# Patient Record
Sex: Male | Born: 1960 | Race: White | Hispanic: No | Marital: Married | Smoking: Former smoker
Health system: Southern US, Community
[De-identification: ages and names within clinical notes are randomized; demographics above are authoritative.]

## PROBLEM LIST (undated history)

## (undated) DIAGNOSIS — E119 Type 2 diabetes mellitus without complications: Secondary | ICD-10-CM

## (undated) DIAGNOSIS — Z720 Tobacco use: Secondary | ICD-10-CM

## (undated) DIAGNOSIS — E785 Hyperlipidemia, unspecified: Secondary | ICD-10-CM

## (undated) DIAGNOSIS — C801 Malignant (primary) neoplasm, unspecified: Secondary | ICD-10-CM

## (undated) DIAGNOSIS — I10 Essential (primary) hypertension: Secondary | ICD-10-CM

## (undated) DIAGNOSIS — M1990 Unspecified osteoarthritis, unspecified site: Secondary | ICD-10-CM

## (undated) DIAGNOSIS — K219 Gastro-esophageal reflux disease without esophagitis: Secondary | ICD-10-CM

## (undated) DIAGNOSIS — T8859XA Other complications of anesthesia, initial encounter: Secondary | ICD-10-CM

## (undated) DIAGNOSIS — Z9289 Personal history of other medical treatment: Secondary | ICD-10-CM

## (undated) DIAGNOSIS — L989 Disorder of the skin and subcutaneous tissue, unspecified: Secondary | ICD-10-CM

## (undated) DIAGNOSIS — F410 Panic disorder [episodic paroxysmal anxiety] without agoraphobia: Secondary | ICD-10-CM

## (undated) DIAGNOSIS — I219 Acute myocardial infarction, unspecified: Secondary | ICD-10-CM

## (undated) DIAGNOSIS — I2510 Atherosclerotic heart disease of native coronary artery without angina pectoris: Secondary | ICD-10-CM

## (undated) DIAGNOSIS — T4145XA Adverse effect of unspecified anesthetic, initial encounter: Secondary | ICD-10-CM

## (undated) DIAGNOSIS — R0600 Dyspnea, unspecified: Secondary | ICD-10-CM

## (undated) DIAGNOSIS — N433 Hydrocele, unspecified: Secondary | ICD-10-CM

## (undated) HISTORY — DX: Atherosclerotic heart disease of native coronary artery without angina pectoris: I25.10

## (undated) HISTORY — DX: Hyperlipidemia, unspecified: E78.5

## (undated) HISTORY — PX: OTHER SURGICAL HISTORY: SHX169

## (undated) HISTORY — PX: FRACTURE SURGERY: SHX138

## (undated) HISTORY — PX: CARPAL TUNNEL RELEASE: SHX101

## (undated) HISTORY — DX: Type 2 diabetes mellitus without complications: E11.9

## (undated) HISTORY — PX: KNEE ARTHROSCOPY: SUR90

## (undated) HISTORY — DX: Hydrocele, unspecified: N43.3

## (undated) HISTORY — PX: CARDIAC CATHETERIZATION: SHX172

## (undated) NOTE — *Deleted (*Deleted)
PACU TO INPATIENT HANDOFF REPORT  Name/Age/Gender Todd [name] 36 y.o. male  Code Status Code Status History    Date Active Date Inactive Code Status Order ID Comments User Context   05/06/2018 2320 05/07/2018 2016 Full Code [id number]  Therisa Doyne, MD Inpatient   05/01/2018 1635 05/03/2018 1425 Full Code [id number]  Ranee Gosselin, MD Inpatient   09/20/2016 1141 09/21/2016 0315 Full Code [id number]  Sebastian Ache, MD HOV   01/11/2012 2112 01/12/2012 2025 Full Code [id number]  Raelyn Number, RN Inpatient   Advance Care Planning Activity    Questions for Most Recent Historical Code Status (Order [id number])       Home/SNF/Other {Discharge Destination:18313::"Home"}  Chief Complaint endobronchial lesion  Level of Care/Admitting Diagnosis ED Disposition    None      Medical History Past Medical History:  Diagnosis Date  . Arthritis   . CAD (coronary artery disease)    NSTEMI with DES to LAD; 100% nondominant RCA with collaterals - EF is normal.   . 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 Complication of anesthesia    slow to wake up   . Diabetes mellitus type II, non insulin dependent (HCC)   . Dyspnea    with panic attacks  . GERD (gastroesophageal reflux disease)   . History of blood transfusion   . HLD (hyperlipidemia)   . Hydrocele in adult    confirmed by [place], elected to monitor after seeing urology.   . Hyperlipidemia   . Hypertension   . Myocardial infarction (HCC)   . Panic attacks   . Pneumonia 2021  . Skin abnormalities    affecting right hand thumb, pointer, and index fingers and palm; left hand thumb pointer and index finger. peeling/cracked/blister skin , warm to touch. scattered areas of superficial skin loss esp to R/L anterior index digits. glossy appearance and edema, reports itchiness, numbness, and pain, no drainage, occurs intermittently, ongoing for years, unsure of triggers, temp.relief w/steroids   . Tobacco abuse      Allergies Allergies  Allergen Reactions  . Dust Mite Extract Anaphylaxis  . Other Swelling, Rash and Other (See Comments)    Grass  Dog Dander-sneeze and throat swells-- long haired dog  . Penicillins Hives  . Aspirin Nausea Only and Other (See Comments)    Patient can tolerate 81 mg ONLY  . Latex Other (See Comments)    Unknown  . Ibuprofen Nausea And Vomiting and Other (See Comments)    Causes Severe headaches    IV Location/Drains/Wounds Patient Lines/Drains/Airways Status    Active Line/Drains/Airways    Name Placement date Placement time Site Days   Incision (Closed) 05/01/18 Knee Right 05/01/18  1401   848          Labs/Imaging Results for orders placed or performed during the hospital encounter of 08/26/20 (from the past 48 hour(s))  CBC     Status: None   Collection Time: 08/26/20  6:12 AM  Result Value Ref Range   WBC 7.5 4.0 - 10.5 K/uL   RBC 4.89 4.22 - 5.81 MIL/uL   Hemoglobin 15.0 13.0 - 17.0 g/dL   HCT 53.6 39 - 52 %   MCV 94.5 80.0 - 100.0 fL   MCH 30.7 26.0 - 34.0 pg   MCHC 32.5 30.0 - 36.0 g/dL   RDW 64.4 03.4 - 74.2 %   Platelets 172 150 - 400 K/uL   nRBC 0.0 0.0 - 0.2 %  Comment: Performed at [facility], [address] [place], [place] [address]  Protime-INR     Status: None   Collection Time: 08/26/20  6:12 AM  Result Value Ref Range   Prothrombin Time 13.0 11.4 - 15.2 seconds   INR 1.0 0.8 - 1.2    Comment: (NOTE) INR goal varies based on device and disease states. Performed at [facility], [address], [place], [place] [address]   Glucose, capillary     Status: Abnormal   Collection Time: 08/26/20  6:19 AM  Result Value Ref Range   Glucose-Capillary 180 (H) 70 - 99 mg/dL    Comment: Glucose reference range applies only to samples taken after fasting for at least 8 hours.  Basic metabolic panel     Status: Abnormal   Collection Time: 08/26/20  6:39 AM  Result Value Ref Range   Sodium 136 135 - 145 mmol/L    Potassium 4.0 3.5 - 5.1 mmol/L   Chloride 97 (L) 98 - 111 mmol/L   CO2 29 22 - 32 mmol/L   Glucose, Bld 164 (H) 70 - 99 mg/dL    Comment: Glucose reference range applies only to samples taken after fasting for at least 8 hours.   BUN 5 (L) 8 - 23 mg/dL   Creatinine, Ser 7.82 0.61 - 1.24 mg/dL   Calcium 8.8 (L) 8.9 - 10.3 mg/dL   GFR, Estimated >95 >62 mL/min   Anion gap 10 5 - 15    Comment: Performed at [facility], [address], [place], [place] [address]  Glucose, capillary     Status: Abnormal   Collection Time: 08/26/20  9:22 AM  Result Value Ref Range   Glucose-Capillary 205 (H) 70 - 99 mg/dL    Comment: Glucose reference range applies only to samples taken after fasting for at least 8 hours.   DG CHEST PORT 1 VIEW  Result Date: 08/26/2020 CLINICAL DATA:  Respiratory abnormality EXAM: PORTABLE CHEST 1 VIEW COMPARISON:  07/23/2020 FINDINGS: None lung volumes are small, but are symmetric. Pulmonary insufflation has decreased since prior examination and there has developed bibasilar atelectasis. No superimposed confluent pulmonary infiltrate. No pneumothorax or pleural effusion. Cardiac size within normal limits. No acute bone abnormality. IMPRESSION: Progressive pulmonary hypoinflation with bibasilar atelectasis. Electronically Signed   By: Helyn Numbers MD   On: 08/26/2020 09:46    Pending Labs   Vitals/Pain Today's Vitals   08/26/20 1045 08/26/20 1100 08/26/20 1115 08/26/20 1130  BP: 120/71 122/71 112/79 115/71  Pulse: 79 70 67 70  Resp: 13 18 14 18   Temp:    (!) 97.5 F (36.4 C)  TempSrc:      SpO2: 90% 96% 100% 95%  Weight:      Height:      PainSc:  0-No pain 0-No pain 0-No pain    Isolation Precautions @ISOLATION @  Administered Medications Periop Administered Meds from 08/26/2020 0618 to 08/26/2020 1155      Date/Time Order Dose Route Action Action by Comments    08/26/2020 0630 chlorhexidine (PERIDEX) 0.12 % solution 15 mL 15 mL Mouth/Throat Given  Wendy Poet, RN     08/26/2020 0954 insulin aspart (novoLOG) injection 5 Units 5 Units Subcutaneous Given Alinda Deem, RN     08/26/2020 5784 lactated ringers infusion   Intravenous Anesthesia Volume Adjustment Samara Deist, CRNA     08/26/2020 0727 lactated ringers infusion   Intravenous New Bag/Given Samara Deist, CRNA  08/26/2020 0736 lidocaine 2% (20 mg/mL) 5 mL syringe 40 mg Intravenous Given Samara Deist, CRNA     08/26/2020 0817 ondansetron (ZOFRAN) injection 4 mg Intravenous Given Samara Deist, CRNA     08/26/2020 0825 phenylephrine (NEOSYNEPHRINE) 10-0.9 MG/250ML-% infusion 0 mcg/min Intravenous Stopped Samara Deist, CRNA     08/26/2020 614 034 4947 phenylephrine (NEOSYNEPHRINE) 10-0.9 MG/250ML-% infusion 25 mcg/min Intravenous Rate/Dose Change Samara Deist, CRNA     08/26/2020 0802 phenylephrine (NEOSYNEPHRINE) 10-0.9 MG/250ML-% infusion 100 mcg/min Intravenous Rate/Dose Change Samara Deist, CRNA     08/26/2020 0751 phenylephrine (NEOSYNEPHRINE) 10-0.9 MG/250ML-% infusion 75 mcg/min Intravenous Rate/Dose Change Samara Deist, CRNA     08/26/2020 0743 phenylephrine (NEOSYNEPHRINE) 10-0.9 MG/250ML-% infusion 50 mcg/min Intravenous New Bag/Given Samara Deist, CRNA     08/26/2020 0746 PHENYLephrine 40 mcg/ml in normal saline Adult IV Push Syringe (For Blood Pressure Support) 40 mcg Intravenous Given Samara Deist, CRNA     08/26/2020 0744 PHENYLephrine 40 mcg/ml in normal saline Adult IV Push Syringe (For Blood Pressure Support) 80 mcg Intravenous Given Samara Deist, CRNA     08/26/2020 0741 PHENYLephrine 40 mcg/ml in normal saline Adult IV Push Syringe (For Blood Pressure Support) 80 mcg Intravenous Given Samara Deist, CRNA     08/26/2020 0736 propofol (DIPRIVAN) 10 mg/mL bolus/IV push 170 mg Intravenous Given Samara Deist, CRNA     08/26/2020 0736 rocuronium bromide 10 mg/mL (PF) syringe 60 mg Intravenous Given Samara Deist, CRNA     08/26/2020 0825 sugammadex sodium (BRIDION) injection 200 mg Intravenous Given Samara Deist, CRNA     08/26/2020 [phone] sugammadex sodium (BRIDION) injection 200 mg Intravenous Given Samara Deist, CRNA       Mobility {Mobility:20148}

---

## 1984-11-06 DIAGNOSIS — C801 Malignant (primary) neoplasm, unspecified: Secondary | ICD-10-CM

## 1984-11-06 HISTORY — DX: Malignant (primary) neoplasm, unspecified: C80.1

## 1998-02-13 ENCOUNTER — Emergency Department (HOSPITAL_COMMUNITY): Admission: EM | Admit: 1998-02-13 | Discharge: 1998-02-13 | Payer: Self-pay | Admitting: Emergency Medicine

## 2000-02-22 ENCOUNTER — Emergency Department (HOSPITAL_COMMUNITY): Admission: EM | Admit: 2000-02-22 | Discharge: 2000-02-22 | Payer: Self-pay | Admitting: Emergency Medicine

## 2001-06-28 ENCOUNTER — Emergency Department (HOSPITAL_COMMUNITY): Admission: EM | Admit: 2001-06-28 | Discharge: 2001-06-28 | Payer: Self-pay | Admitting: Emergency Medicine

## 2003-03-10 ENCOUNTER — Emergency Department (HOSPITAL_COMMUNITY): Admission: EM | Admit: 2003-03-10 | Discharge: 2003-03-10 | Payer: Self-pay | Admitting: Emergency Medicine

## 2004-01-27 ENCOUNTER — Ambulatory Visit (HOSPITAL_BASED_OUTPATIENT_CLINIC_OR_DEPARTMENT_OTHER): Admission: RE | Admit: 2004-01-27 | Discharge: 2004-01-27 | Payer: Self-pay | Admitting: Urology

## 2004-03-01 ENCOUNTER — Emergency Department (HOSPITAL_COMMUNITY): Admission: EM | Admit: 2004-03-01 | Discharge: 2004-03-01 | Payer: Self-pay | Admitting: Emergency Medicine

## 2004-03-02 ENCOUNTER — Emergency Department (HOSPITAL_COMMUNITY): Admission: EM | Admit: 2004-03-02 | Discharge: 2004-03-02 | Payer: Self-pay | Admitting: Emergency Medicine

## 2010-03-14 ENCOUNTER — Emergency Department (HOSPITAL_COMMUNITY): Admission: EM | Admit: 2010-03-14 | Discharge: 2010-03-14 | Payer: Self-pay | Admitting: Emergency Medicine

## 2010-05-18 ENCOUNTER — Emergency Department (HOSPITAL_COMMUNITY): Admission: EM | Admit: 2010-05-18 | Discharge: 2010-05-18 | Payer: Self-pay | Admitting: Family Medicine

## 2010-09-10 ENCOUNTER — Emergency Department (HOSPITAL_COMMUNITY): Admission: EM | Admit: 2010-09-10 | Discharge: 2010-09-10 | Payer: Self-pay | Admitting: Family Medicine

## 2011-03-24 NOTE — Op Note (Signed)
NAME:  Todd [name], Todd [name]                           ACCOUNT NO.:  [id number]   MEDICAL RECORD NO.:  [id number]                   PATIENT TYPE:  AMB   LOCATION:  [facility]                                 FACILITY:  [facility]   PHYSICIAN:  Valetta Fuller, M.D.               DATE OF BIRTH:  1961-01-04   DATE OF PROCEDURE:  01/27/2004  DATE OF DISCHARGE:  01/27/2004                                 OPERATIVE REPORT   PREOPERATIVE DIAGNOSIS:  Elective bilateral vasectomy.   POSTOPERATIVE DIAGNOSIS:  Elective bilateral vasectomy.   OPERATION/PROCEDURE:  Bilateral scrotal vasectomy.   SURGEON:  Valetta Fuller, M.D.   ANESTHESIA:  General.   INDICATIONS:  [name] is a 50 year old male.  He presented to our office for  consideration of vasectomy.  He understood the advantages and disadvantages  of that approach.  He agreed to have this in the office which is the way we  normally approach this.  At the time of his procedure, he was clearly  markedly anxious.  On exam, he had a very, very tight scrotum.  We attempted  to warm the scrotum to loosen it.  Despite that maneuver, any manipulation  of the scrotum was very difficult for the patient to handle.  I think this  was due partly to anxiety.  For that reason, we felt that __________ in the  office may be very difficult and we elected to cancel it and reschedule it  for today under anesthesia.   DESCRIPTION OF PROCEDURE:  The patient was brought to the operating room  where he had successful induction of general anesthesia.  He was then  prepped and draped in the usual manner.   Attention was first turned to the right hemiscrotum.  The vas deferens was  separated from the other surrounding spermatic cord structures and  manipulated towards the anterior surface of the scrotal wall  A small  incision was made overlying the vas.  The vas grasper was then used to pull  up the vas.  Using a combination of sharp and blunt dissection technique,  approximately a 3 cm section of vas deferens was cleared.  Proximal and  distal ends were clamped and the segment was removed.  Both ends were then  cauterized with the needle tip cautery.  Both ends were tied with Vicryl  suture.  The more proximal aspect of the vas was then buried in a separate  adventitial plane away from the other side.  Skin was closed with some  interrupted absorbable suture.  The contralateral side was done in an  analogous manner.  The patient appeared to tolerate the procedure well and  there were no obvious complications.  Valetta Fuller, M.D.    DSG/MEDQ  D:  02/15/2004  T:  02/15/2004  Job:  [id number]

## 2011-03-24 NOTE — Consult Note (Signed)
NAME:  Todd [name], Todd [name]                           ACCOUNT NO.:  [id number]   MEDICAL RECORD NO.:  [id number]                   PATIENT TYPE:  EMS   LOCATION:  [facility]                                 FACILITY:  [facility]   PHYSICIAN:  Karol T. Lazarus Salines, M.D.              DATE OF BIRTH:  [birth year]   DATE OF CONSULTATION:  03/02/2004  DATE OF DISCHARGE:                                   CONSULTATION   CHIEF COMPLAINT:  Right epistaxis.   HISTORY OF PRESENT ILLNESS:  A 50 year old white male who started with  profuse right epistaxis yesterday. He came to the emergency room and had a  pack placed which seemed to control things. He was awakened at 3:00 this  morning with additional bleeding and came back to the emergency room. Dr.  Freida Busman attempted to pack this further without successful control. ENT was  called in consultation. The patient has no known bleeding problems and does  not take any form of aspirin. He has borderline hypertension. Blood pressure  here in the emergency room was 158/98. The patient has had no recent sinus  infection. The nose bleed was unprovoked. No trauma to the nose or prior  surgery. He has had two prior episodes of nose bleed of similar nature one 6  years ago and one 16 years ago. The first one apparently stopped  spontaneously. The second one was cauterized by the emergency room physician  who said he saw a blood vessel behind the eye. The patient does smoke. He is  currently unemployed.   PHYSICAL EXAMINATION:  This is a stocky, anxious appearing, middle-aged  white male. There was blood soiling around his face. There is an inflated  nasal pack in his right nose. Mental status is intact. He has a hemotympanum  on the right side. Left ear is clear. Oral cavity reveals short jaw and a  very bulky tongue. I could just barely see his soft palate. There was no  obvious blood in the pharynx. Neck without adenopathy.   In the nose, there was a small amount of old blood on  the left side. This  was suctioned away. There was no obvious bleeding nor any vessels visible.  On the right side, I removed the pack. There was moderate oozing from  apparently low down in the nose. I could not get a good sense of exactly  where. With moderate discomfort, I placed cotton pledgets with a 50/50  mixture of 4% Xylocaine and Afrin in his nose to start achieving  decongestion and topical anesthesia. I allowed these to remain in place for  several minutes and then replaced with additional packs. This did seem to  staunch the bleeding to some degree, and the patient was also somewhat more  comfortable. Inspection once again failed to reveal any obvious bleeding  sites. I had him blow all of the clots out of his nose. There  was some clots  in the middle meatus and also inferior meatus which I suctioned clear.  Again, no sites were noticed. The nose was repacked with cotton pledgets  with the same material. I had the operating room deliver 0 and 30 degree  nasal endoscopes. One percent Xylocaine with 1/100,000 epinephrine was  infiltrated into the anterior septum and into the anterior pole of the  inferior turbinate. Cotton packing material was removed, and the nasal  endoscope was introduced. Both the 0 and 30 degree scopes were used. The  nose was quite narrow and still relatively tender.   I anesthetized the right hard palate with Hurricaine spray followed by 1%  Xylocaine with 1/100,000 epinephrine and finally was able to instill 2 cc of  this into the greater palatal foramen. Additional material was placed deep  around to the nasal septum using the 25 gauge needle. The endoscopes were  introduced once again. There was some blood coming out of an accessory  fontanelle in the middle meatus which was suctioned clear but no active  bleeding, and this blood appeared to be old. There were no excoriations or  bleeding back in the sphenoethmoid recess, around the middle turbinate,  or  middle meatus, or into the high vault of the nose. There was some thickness  of the anterior septum and some spurring at the maxillary crest. No obvious  bleeding vessels on the septum anywhere or on the floor of the nose. There  was what appeared to be a possible granuloma or possibly an excoriation  underneath the anterior hood of inferior turbinate. This never did bleed  profusely with manipulation. Partial inspection into the inferior meatus  revealed no other bleeding sites.   Additional 1% Xylocaine with 1/100,000 epinephrine was infiltrated into the  inferior turbinate and into the inferior meatus using a 25-gauge spinal  needle. Several additional minutes were allowed for this to take effect.  Attempts were made to __________ the turbinate which were unsuccessful given  its bulk. No other bleeding sites were noted. Silver nitrate cautery was  performed under the hood of the inferior turbinate in the visualized site  under endoscopic control. The patient tolerated this well. There was no  significant bleeding during the manipulation nor afterwards. Silver nitrate  residual was suctioned free. Once again, the internal nose was inspected  with a 30-degree endoscope including the nasopharynx with no other  significant bleeding sites noted.   IMPRESSION:  Presumptive right anterior/inferior meatus epistaxis. It is  possible that the various medications used to achieve anesthesia also  staunched the bleeding with vasoconstrictor effect.   PLAN:  I will have him use nasal hygiene measures. Limit his activities for  four to five days. Avoid aspirin products. Because this had happened now  three separate times, I think he will need an evaluation including a sinus  CT scan and possibly even MRA scan, looking for an AV malformation or  similar. I will see the patient back in my office in one month and make  these plans. He knows to contact us sooner for problems.                                               Gloris Manchester. Lazarus Salines, M.D.    KTW/MEDQ  D:  03/02/2004  T:  03/02/2004  Job:  [id number]

## 2011-11-07 HISTORY — PX: ARTHROSCOPIC REPAIR ACL: SUR80

## 2011-12-16 ENCOUNTER — Emergency Department (HOSPITAL_COMMUNITY)
Admission: EM | Admit: 2011-12-16 | Discharge: 2011-12-17 | Disposition: A | Discharge status: Home / Self Care | Payer: Managed Care, Other (non HMO) | Attending: Emergency Medicine | Admitting: Emergency Medicine

## 2011-12-16 ENCOUNTER — Emergency Department (HOSPITAL_COMMUNITY): Payer: Managed Care, Other (non HMO)

## 2011-12-16 ENCOUNTER — Encounter (HOSPITAL_COMMUNITY): Payer: Self-pay

## 2011-12-16 DIAGNOSIS — M79609 Pain in unspecified limb: Secondary | ICD-10-CM | POA: Insufficient documentation

## 2011-12-16 DIAGNOSIS — M1990 Unspecified osteoarthritis, unspecified site: Secondary | ICD-10-CM | POA: Insufficient documentation

## 2011-12-16 DIAGNOSIS — Z79899 Other long term (current) drug therapy: Secondary | ICD-10-CM | POA: Insufficient documentation

## 2011-12-16 DIAGNOSIS — IMO0002 Reserved for concepts with insufficient information to code with codable children: Secondary | ICD-10-CM | POA: Insufficient documentation

## 2011-12-16 DIAGNOSIS — I10 Essential (primary) hypertension: Secondary | ICD-10-CM | POA: Insufficient documentation

## 2011-12-16 DIAGNOSIS — F17200 Nicotine dependence, unspecified, uncomplicated: Secondary | ICD-10-CM | POA: Insufficient documentation

## 2011-12-16 DIAGNOSIS — M7989 Other specified soft tissue disorders: Secondary | ICD-10-CM | POA: Insufficient documentation

## 2011-12-16 DIAGNOSIS — M25569 Pain in unspecified knee: Secondary | ICD-10-CM | POA: Insufficient documentation

## 2011-12-16 HISTORY — DX: Essential (primary) hypertension: I10

## 2011-12-16 MED ORDER — NAPROXEN 500 MG PO TABS
500.0000 mg | ORAL_TABLET | Freq: Once | ORAL | Status: AC
  Administered 2011-12-17: 500 mg via ORAL
  Filled 2011-12-16: qty 1

## 2011-12-16 MED ORDER — ACETAMINOPHEN 325 MG PO TABS
650.0000 mg | ORAL_TABLET | Freq: Once | ORAL | Status: AC
  Administered 2011-12-17: 650 mg via ORAL
  Filled 2011-12-16: qty 2

## 2011-12-16 NOTE — ED Notes (Signed)
Pt states that his employer is going to need a work note;

## 2011-12-17 MED ORDER — NAPROXEN 375 MG PO TABS: 375.0000 mg | ORAL_TABLET | Freq: Two times a day (BID) | ORAL | Status: DC

## 2011-12-17 MED ORDER — TRAMADOL HCL 50 MG PO TABS: 50.0000 mg | ORAL_TABLET | Freq: Four times a day (QID) | ORAL | Status: AC | PRN
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
## 2011-12-17 NOTE — ED Notes (Signed)
Bandaid applied to right middle finger--no further drainage.

## 2011-12-17 NOTE — ED Notes (Signed)
Right middle finger lanced and fluid expressed by Dr. Dierdre Highman.

## 2012-01-11 ENCOUNTER — Other Ambulatory Visit: Payer: Self-pay

## 2012-01-11 ENCOUNTER — Emergency Department (HOSPITAL_COMMUNITY): Payer: Managed Care, Other (non HMO)

## 2012-01-11 ENCOUNTER — Encounter (HOSPITAL_COMMUNITY): Payer: Self-pay | Admitting: *Deleted

## 2012-01-11 ENCOUNTER — Inpatient Hospital Stay (HOSPITAL_COMMUNITY)
Admission: EM | Admit: 2012-01-11 | Discharge: 2012-01-12 | DRG: 917 | Disposition: A | Discharge status: Home / Self Care | Payer: Managed Care, Other (non HMO) | Attending: Family Medicine | Admitting: Family Medicine

## 2012-01-11 DIAGNOSIS — E872 Acidosis, unspecified: Secondary | ICD-10-CM | POA: Diagnosis present

## 2012-01-11 DIAGNOSIS — G929 Unspecified toxic encephalopathy: Secondary | ICD-10-CM | POA: Diagnosis present

## 2012-01-11 DIAGNOSIS — T65891A Toxic effect of other specified substances, accidental (unintentional), initial encounter: Secondary | ICD-10-CM | POA: Diagnosis present

## 2012-01-11 DIAGNOSIS — R4182 Altered mental status, unspecified: Secondary | ICD-10-CM

## 2012-01-11 DIAGNOSIS — R680 Hypothermia, not associated with low environmental temperature: Secondary | ICD-10-CM | POA: Diagnosis present

## 2012-01-11 DIAGNOSIS — T510X1A Toxic effect of ethanol, accidental (unintentional), initial encounter: Secondary | ICD-10-CM | POA: Diagnosis present

## 2012-01-11 DIAGNOSIS — F1020 Alcohol dependence, uncomplicated: Secondary | ICD-10-CM | POA: Diagnosis present

## 2012-01-11 DIAGNOSIS — F17200 Nicotine dependence, unspecified, uncomplicated: Secondary | ICD-10-CM | POA: Diagnosis present

## 2012-01-11 DIAGNOSIS — F10929 Alcohol use, unspecified with intoxication, unspecified: Secondary | ICD-10-CM

## 2012-01-11 DIAGNOSIS — F1010 Alcohol abuse, uncomplicated: Secondary | ICD-10-CM | POA: Diagnosis present

## 2012-01-11 DIAGNOSIS — G92 Toxic encephalopathy: Secondary | ICD-10-CM | POA: Diagnosis present

## 2012-01-11 DIAGNOSIS — M1990 Unspecified osteoarthritis, unspecified site: Secondary | ICD-10-CM | POA: Diagnosis present

## 2012-01-11 DIAGNOSIS — I152 Hypertension secondary to endocrine disorders: Secondary | ICD-10-CM

## 2012-01-11 DIAGNOSIS — E876 Hypokalemia: Secondary | ICD-10-CM | POA: Diagnosis present

## 2012-01-11 DIAGNOSIS — K2921 Alcoholic gastritis with bleeding: Secondary | ICD-10-CM | POA: Diagnosis present

## 2012-01-11 DIAGNOSIS — I10 Essential (primary) hypertension: Secondary | ICD-10-CM | POA: Diagnosis present

## 2012-01-11 DIAGNOSIS — T510X4A Toxic effect of ethanol, undetermined, initial encounter: Principal | ICD-10-CM | POA: Diagnosis present

## 2012-01-11 DIAGNOSIS — R748 Abnormal levels of other serum enzymes: Secondary | ICD-10-CM | POA: Diagnosis present

## 2012-01-11 DIAGNOSIS — D72829 Elevated white blood cell count, unspecified: Secondary | ICD-10-CM | POA: Diagnosis present

## 2012-01-11 DIAGNOSIS — R112 Nausea with vomiting, unspecified: Secondary | ICD-10-CM | POA: Diagnosis present

## 2012-01-11 LAB — BLOOD GAS, VENOUS
FIO2: 0.21 %
Patient temperature: 37
TCO2: 18.2 mmol/L (ref 0–100)
pH, Ven: 7.359 — ABNORMAL HIGH (ref 7.250–7.300)

## 2012-01-11 LAB — URINALYSIS, ROUTINE W REFLEX MICROSCOPIC
Glucose, UA: 500 mg/dL — AB
Leukocytes, UA: NEGATIVE
pH: 7 (ref 5.0–8.0)

## 2012-01-11 LAB — RAPID URINE DRUG SCREEN, HOSP PERFORMED
Amphetamines: NOT DETECTED
Barbiturates: NOT DETECTED
Benzodiazepines: NOT DETECTED
Cocaine: NOT DETECTED
Opiates: NOT DETECTED
Tetrahydrocannabinol: NOT DETECTED

## 2012-01-11 LAB — CBC
Hemoglobin: 17.1 g/dL — ABNORMAL HIGH (ref 13.0–17.0)
MCH: 33 pg (ref 26.0–34.0)
MCHC: 36.2 g/dL — ABNORMAL HIGH (ref 30.0–36.0)
Platelets: 201 10*3/uL (ref 150–400)
RDW: 12.5 % (ref 11.5–15.5)

## 2012-01-11 LAB — DIFFERENTIAL
Basophils Absolute: 0.1 10*3/uL (ref 0.0–0.1)
Basophils Relative: 1 % (ref 0–1)
Eosinophils Absolute: 0.3 10*3/uL (ref 0.0–0.7)
Monocytes Absolute: 0.9 10*3/uL (ref 0.1–1.0)
Neutrophils Relative %: 56 % (ref 43–77)

## 2012-01-11 LAB — ACETAMINOPHEN LEVEL: Acetaminophen (Tylenol), Serum: <15 ug/mL (ref 10–30)

## 2012-01-11 LAB — COMPREHENSIVE METABOLIC PANEL
ALT: 31 U/L (ref 0–53)
Calcium: 8.9 mg/dL (ref 8.4–10.5)
GFR calc Af Amer: >90 mL/min (ref >90)
Glucose, Bld: 196 mg/dL — ABNORMAL HIGH (ref 70–99)
Sodium: 135 mEq/L (ref 135–145)
Total Protein: 7.3 g/dL (ref 6.0–8.3)

## 2012-01-11 LAB — ETHANOL: Alcohol, Ethyl (B): 266 mg/dL — ABNORMAL HIGH (ref 0–11)

## 2012-01-11 LAB — CARDIAC PANEL(CRET KIN+CKTOT+MB+TROPI)
CK, MB: 12.5 ng/mL (ref 0.3–4.0)
Total CK: 511 U/L — ABNORMAL HIGH (ref 7–232)

## 2012-01-11 LAB — ABO/RH: ABO/RH(D): A NEG

## 2012-01-11 LAB — MAGNESIUM: Magnesium: 2 mg/dL (ref 1.5–2.5)

## 2012-01-11 LAB — LACTIC ACID, PLASMA: Lactic Acid, Venous: 4.3 mmol/L — ABNORMAL HIGH (ref 0.5–2.2)

## 2012-01-11 MED ORDER — LORAZEPAM 1 MG PO TABS: 1.0000 mg | ORAL_TABLET | Freq: Four times a day (QID) | ORAL | Status: DC | PRN

## 2012-01-11 MED ORDER — SODIUM CHLORIDE 0.9 % IV SOLN
1400.0000 mg | Freq: Once | INTRAVENOUS | Status: AC
  Administered 2012-01-11: 1400 mg via INTRAVENOUS
  Filled 2012-01-11: qty 1.4

## 2012-01-11 MED ORDER — POTASSIUM CHLORIDE 10 MEQ/100ML IV SOLN
10.0000 meq | Freq: Once | INTRAVENOUS | Status: AC
  Administered 2012-01-11: 10 meq via INTRAVENOUS
  Filled 2012-01-11: qty 100

## 2012-01-11 MED ORDER — THIAMINE HCL 100 MG/ML IJ SOLN
100.0000 mg | Freq: Once | INTRAMUSCULAR | Status: AC
  Administered 2012-01-11: 100 mg via INTRAVENOUS
  Filled 2012-01-11: qty 2

## 2012-01-11 MED ORDER — ADULT MULTIVITAMIN W/MINERALS CH
1.0000 | ORAL_TABLET | Freq: Every day | ORAL | Status: DC
  Administered 2012-01-11 – 2012-01-12 (×2): 1 via ORAL
  Filled 2012-01-11 (×3): qty 1

## 2012-01-11 MED ORDER — SODIUM CHLORIDE 0.9 % IV SOLN
INTRAVENOUS | Status: DC
  Administered 2012-01-11 – 2012-01-12 (×3): via INTRAVENOUS

## 2012-01-11 MED ORDER — PANTOPRAZOLE SODIUM 40 MG IV SOLR
40.0000 mg | Freq: Once | INTRAVENOUS | Status: AC
  Administered 2012-01-11: 40 mg via INTRAVENOUS
  Filled 2012-01-11: qty 40

## 2012-01-11 MED ORDER — THIAMINE HCL 100 MG/ML IJ SOLN
100.0000 mg | Freq: Every day | INTRAMUSCULAR | Status: DC
  Filled 2012-01-11 (×2): qty 1

## 2012-01-11 MED ORDER — LORAZEPAM 2 MG/ML IJ SOLN: 0.0000 mg | Freq: Four times a day (QID) | INTRAMUSCULAR | Status: DC

## 2012-01-11 MED ORDER — FOLIC ACID 1 MG PO TABS
1.0000 mg | ORAL_TABLET | Freq: Once | ORAL | Status: AC
  Administered 2012-01-11: 1 mg via ORAL
  Filled 2012-01-11: qty 1

## 2012-01-11 MED ORDER — ENOXAPARIN SODIUM 40 MG/0.4ML ~~LOC~~ SOLN
40.0000 mg | SUBCUTANEOUS | Status: DC
  Administered 2012-01-11: 40 mg via SUBCUTANEOUS
  Filled 2012-01-11 (×4): qty 0.4

## 2012-01-11 MED ORDER — ONDANSETRON HCL 4 MG/2ML IJ SOLN: 4.0000 mg | Freq: Three times a day (TID) | INTRAMUSCULAR | Status: AC | PRN

## 2012-01-11 MED ORDER — LORAZEPAM 2 MG/ML IJ SOLN: 0.0000 mg | Freq: Two times a day (BID) | INTRAMUSCULAR | Status: DC

## 2012-01-11 MED ORDER — FOLIC ACID 1 MG PO TABS
1.0000 mg | ORAL_TABLET | Freq: Every day | ORAL | Status: DC
  Administered 2012-01-12: 1 mg via ORAL
  Filled 2012-01-11 (×2): qty 1

## 2012-01-11 MED ORDER — SODIUM CHLORIDE 0.9 % IV SOLN
Freq: Once | INTRAVENOUS | Status: AC
  Administered 2012-01-11: 18:00:00 via INTRAVENOUS

## 2012-01-11 MED ORDER — LORAZEPAM 2 MG/ML IJ SOLN
1.0000 mg | Freq: Four times a day (QID) | INTRAMUSCULAR | Status: DC | PRN
  Administered 2012-01-11: 1 mg via INTRAVENOUS
  Filled 2012-01-11: qty 1

## 2012-01-11 MED ORDER — SODIUM CHLORIDE 0.9 % IV BOLUS (SEPSIS)
1000.0000 mL | Freq: Once | INTRAVENOUS | Status: AC
  Administered 2012-01-11: 1000 mL via INTRAVENOUS

## 2012-01-11 MED ORDER — SODIUM CHLORIDE 0.9 % IV SOLN
INTRAVENOUS | Status: AC
  Administered 2012-01-11: 19:00:00 via INTRAVENOUS

## 2012-01-11 MED ORDER — ONDANSETRON HCL 4 MG/2ML IJ SOLN
4.0000 mg | Freq: Once | INTRAMUSCULAR | Status: AC
  Administered 2012-01-11: 4 mg via INTRAVENOUS
  Filled 2012-01-11: qty 2

## 2012-01-11 MED ORDER — POTASSIUM CHLORIDE 10 MEQ/100ML IV SOLN
10.0000 meq | INTRAVENOUS | Status: AC
  Administered 2012-01-11 (×3): 10 meq via INTRAVENOUS
  Filled 2012-01-11 (×2): qty 100

## 2012-01-11 MED ORDER — SODIUM CHLORIDE 0.9 % IV SOLN
10.0000 mg/kg | Freq: Two times a day (BID) | INTRAVENOUS | Status: DC
  Administered 2012-01-12: 940 mg via INTRAVENOUS
  Filled 2012-01-11 (×4): qty 0.94

## 2012-01-11 MED ORDER — VITAMIN B-1 100 MG PO TABS
100.0000 mg | ORAL_TABLET | Freq: Every day | ORAL | Status: DC
  Administered 2012-01-12: 100 mg via ORAL
  Filled 2012-01-11 (×2): qty 1

## 2012-01-11 NOTE — ED Notes (Signed)
Pt alert and oriented x4. Respirations even and unlabored, bilateral symmetrical rise and fall of chest. Skin warm and dry. In no acute distress. Denies needs.   

## 2012-01-11 NOTE — ED Notes (Signed)
md alerted of critical blood values

## 2012-01-11 NOTE — ED Notes (Signed)
Pt reports he wants to leave, but rn educated on the importance of staying and receiving treatment. Pt family wants pt to stay and receive treatment. Pt request catheter out and food. Will check with dr.

## 2012-01-11 NOTE — ED Notes (Signed)
Pt fluids changed to infuse into right forearm IV due to pt keeps bending his arm and occluding  Right AC IV
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
## 2012-01-11 NOTE — ED Notes (Signed)
Pt is from home, family called ems stating that pt was throwing up. ems arrived and pt is alert to verbal, pinpoint pupils. Airway intact. Pt not follow commands, pt cant hold throw up bag. Family stated that pt was drinking before family left him. Family thinks he has been drinking moonshine, unsure of amount. pts smells of urine and stool.

## 2012-01-11 NOTE — Progress Notes (Signed)
Attempted to get abg on Lradial artery, but unsuccessful.  Pt refused to any more attempts, stating he has been stuck too many times already.  RN aware
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
## 2012-01-11 NOTE — ED Notes (Signed)
Pt back from x-ray.

## 2012-01-11 NOTE — ED Notes (Signed)
Report given to brewer, rn 

## 2012-01-11 NOTE — ED Notes (Signed)
Pt to xray. Family in room now.

## 2012-01-12 LAB — BASIC METABOLIC PANEL
BUN: 9 mg/dL (ref 6–23)
GFR calc non Af Amer: 79 mL/min — ABNORMAL LOW (ref >90)
Glucose, Bld: 119 mg/dL — ABNORMAL HIGH (ref 70–99)
Potassium: 3.4 mEq/L — ABNORMAL LOW (ref 3.5–5.1)

## 2012-01-12 LAB — CBC
Hemoglobin: 15.7 g/dL (ref 13.0–17.0)
Platelets: 190 10*3/uL (ref 150–400)
RBC: 4.67 MIL/uL (ref 4.22–5.81)
WBC: 12.2 10*3/uL — ABNORMAL HIGH (ref 4.0–10.5)

## 2012-01-12 LAB — COMPREHENSIVE METABOLIC PANEL
BUN: 10 mg/dL (ref 6–23)
CO2: 26 mEq/L (ref 19–32)
Calcium: 8.6 mg/dL (ref 8.4–10.5)
Chloride: 100 mEq/L (ref 96–112)
Creatinine, Ser: 0.78 mg/dL (ref 0.50–1.35)
GFR calc non Af Amer: >90 mL/min (ref >90)
Total Bilirubin: 0.3 mg/dL (ref 0.3–1.2)

## 2012-01-12 LAB — TSH: TSH: 0.39 u[IU]/mL (ref 0.350–4.500)

## 2012-01-12 LAB — ALCOHOL,  ISOPROPYL (ISOPROPANOL): Isopropanol: NEGATIVE

## 2012-01-12 LAB — PROTIME-INR
INR: 0.97 (ref 0.00–1.49)
Prothrombin Time: 13.1 seconds (ref 11.6–15.2)

## 2012-01-12 LAB — PHOSPHORUS: Phosphorus: 3.3 mg/dL (ref 2.3–4.6)

## 2012-01-12 LAB — MAGNESIUM: Magnesium: 1.8 mg/dL (ref 1.5–2.5)

## 2012-01-12 LAB — OSMOLALITY: Osmolality: 317 mOsm/kg — ABNORMAL HIGH (ref 275–300)

## 2012-01-12 MED ORDER — NICOTINE 21 MG/24HR TD PT24
21.0000 mg | MEDICATED_PATCH | Freq: Every day | TRANSDERMAL | Status: DC
  Filled 2012-01-12 (×2): qty 1

## 2012-01-12 NOTE — Progress Notes (Signed)
UR complete 

## 2012-01-12 NOTE — Discharge Summary (Signed)
Physician Discharge Summary  Todd [name]:[id number] DOB: 07-14-1961 DOA: 01/11/2012  PCP: Todd Oats, MD, MD  Admit date: 01/11/2012 Discharge date: 01/12/2012  Discharge Diagnoses:  1. Acute encephalopathy, resolved 2. Hypothermia, resolved 3. Anion gap metabolic acidosis, resolved 4. Moonshine ingestion without evidence for methanol, ethylene glycol or isopropyl alcohol poisoning 5. Elevated CK-MB/total CK, asymptomatic 6. Ethanol intoxication, resolved  Discharge Condition: Improved  Disposition: Home  History of present illness:  51 year old man presented to the emergency department with acute encephalopathy. Family went to pick him up at 1 PM on the day of admission and he was noted to be confused. History of moonshine ingestion. He was admitted for further evaluation and treatment.  Hospital Course:  [name] was admitted to the ICU. He was treated in consultation with poison control. His acute encephalopathy quickly resolved and his laboratory studies quickly normalized. He had no obvious sequela. Abdomen like all, isopropyl alcohol and methanol levels were negative. Repeat chemistry panels were reassuring. Per poison control no further recommendations at this point; patient may be discharged home. 1. Acute encephalopathy: Resolved. Secondary to acute intoxication.  2. Hypothermia: Resolved.  3. Vomiting/possible hematemesis: Resolved. Secondary to alcoholic gastritis.  4. Anion gap metabolic acidosis/lactic acidosis: Resolved.No evidence of methanol poisoning. Fomepizole was given initially. This was discussed in detail with poison control. Per toxicologist Todd Ishihara, MD fomepizole was discontinued. Repeat electrolytes were obtained at 2 PM as well as repeat ethanol. These were unremarkable. Methanol level and isopropyl alcohol level obtained this morning were negative. Patient had no obvious sequela. Case again discussed with poison control who advises no further evaluation  or treatment.    5. Elevated CK-MB/total CK: Likely secondary to acute intoxication.  6. Alcohol intoxication: Resolved.  Consultants:  Poison control  Procedures:  None  Discharge Instructions  Discharge Orders    Future Orders Please Complete By Expires   Diet general      Activity as tolerated - No restrictions        Medication List  As of 01/12/2012  4:59 PM   STOP taking these medications         naproxen 375 MG tablet         TAKE these medications         atenolol 25 MG tablet   Commonly known as: TENORMIN   Take 50 mg by mouth daily.      diclofenac 75 MG EC tablet   Commonly known as: VOLTAREN   Take 75 mg by mouth 2 (two) times daily. inflammation      FLUoxetine 40 MG capsule   Commonly known as: PROZAC   Take 40 mg by mouth daily.      hydrOXYzine 10 MG tablet   Commonly known as: ATARAX/VISTARIL   Take 10 mg by mouth 3 (three) times daily as needed. itching      multivitamins ther. w/minerals Tabs   Take 1 tablet by mouth daily.      potassium chloride SA 20 MEQ tablet   Commonly known as: K-DUR,KLOR-CON   Take 40 mEq by mouth daily.      predniSONE 5 MG Tabs   Commonly known as: STERAPRED UNI-PAK   Take 5 mg by mouth daily.      traMADol 50 MG tablet   Commonly known as: ULTRAM   Take 50 mg by mouth every 6 (six) hours as needed. pain           Follow-up Information    Follow up  with DEFAULT,PROVIDER, MD .          The results of significant diagnostics from this hospitalization (including imaging, microbiology, ancillary and laboratory) are listed below for reference.    Significant Diagnostic Studies: Dg Chest 2 View  01/11/2012  *RADIOLOGY REPORT*  Clinical Data: Altered mental status, shortness of breath  CHEST - 2 VIEW  Comparison: None.  Findings: No active infiltrate or effusion is seen.  Mediastinal contours appear normal.  The heart is within upper limits of normal.  There are mild degenerative changes throughout the  thoracic spine.  IMPRESSION: No active lung disease.  Original Report Authenticated By: Todd [name], M.D.   Ct Cervical Spine Wo Contrast  01/11/2012  *RADIOLOGY REPORT*  Clinical Data:  Altered mental status.  CT HEAD WITHOUT CONTRAST CT CERVICAL SPINE WITHOUT CONTRAST  Technique:  Multidetector CT imaging of the head and cervical spine was performed following the standard protocol without intravenous contrast.  Multiplanar CT image reconstructions of the cervical spine were also generated.  Comparison:   None  CT HEAD  Findings:  There is some cortical atrophy.  No evidence of acute abnormality including infarction, hemorrhage, mass lesion, mass effect, midline shift or abnormal extra-axial fluid collection.  No hydrocephalus or pneumocephalus.  Small bilateral mastoid effusions are noted.  There is some mucosal thickening in the left maxillary sinus.  Prominent venous lakes and vascular impressions are seen in the right frontal bone.  No worrisome calvarial lesion.  IMPRESSION:  1.  No acute finding. 2.  Mild cortical atrophy. 3.  Mucosal thickening left maxillary sinus and small bilateral mastoid effusions.  CT CERVICAL SPINE  Findings: There is no fracture or subluxation of the cervical spine.  The patient has a disc bulging and endplate spurring most notable C5-6 and C6-7.  Lung apices are clear.  IMPRESSION: No acute finding.  Degenerative disease.  Original Report Authenticated By: Todd [name]. Todd [name], M.D.    Microbiology: Recent Results (from the past 240 hour(s))  MRSA PCR SCREENING     Status: Normal   Collection Time   01/11/12  8:40 PM      Component Value Range Status Comment   MRSA by PCR NEGATIVE  NEGATIVE  Final      Labs: Basic Metabolic Panel:  Lab 01/12/12 1610 01/12/12 0200 01/11/12 1517 01/11/12 1515  NA 136 137 135 --  K 3.4* 3.5 -- --  CL 100 100 95* --  CO2 28 26 24  --  GLUCOSE 119* 255* 196* --  BUN 9 10 14  --  CREATININE 1.05 0.78 0.79 --  CALCIUM 8.6 8.6 8.9 --    MG -- 1.8 -- 2.0  PHOS -- 3.3 -- --   Liver Function Tests:  Lab 01/12/12 0200 01/11/12 1517  AST 18 24  ALT 24 31  ALKPHOS 51 56  BILITOT 0.3 0.3  PROT 6.3 7.3  ALBUMIN 3.5 3.9    Lab 01/11/12 1517  LIPASE 41  AMYLASE --    Lab 01/11/12 1517  AMMONIA 34   CBC:  Lab 01/12/12 0200 01/11/12 1517  WBC 12.2* 11.3*  NEUTROABS -- 6.4  HGB 15.7 17.1*  HCT 42.4 47.2  MCV 90.8 91.1  PLT 190 201   Cardiac Enzymes:  Lab 01/11/12 1517  CKTOTAL 511*  CKMB 12.5*  CKMBINDEX --  TROPONINI <0.30   CBG:  Lab 01/11/12 1549  GLUCAP 193*    Time coordinating discharge: 45 minutes.  Signed:  Brendia Sacks, MD  [facility]  01/12/2012, 4:59 PM

## 2012-01-12 NOTE — Progress Notes (Addendum)
PROGRESS NOTE  Todd [name] UUV:[id number] DOB: 12-23-1960 DOA: 01/11/2012 PCP: Sheila Oats, MD, MD  Brief narrative: 51 year old man with a history of drinking 2 glasses of "moonshine/white lightening" along with several beers.   Past medical history: Hypertension  Consultants:  Poison control  Procedures:  None   Interim History: Chart reviewed in detail.  Discussed with RN at bedside. No concerns.  Subjective: No complaints. Feels fine. No visual complaints. Family reports the patient is his usual self.  Objective: Filed Vitals:   01/12/12 0000 01/12/12 0400 01/12/12 0500 01/12/12 0800  BP: 143/64 149/62 149/62   Pulse: 91 81 80   Temp: 97.3 F (36.3 C) 98.7 F (37.1 C)  98.5 F (36.9 C)  TempSrc: Oral Oral  Oral  Resp: 18 16    Height:      Weight:      SpO2: 92% 95%      Intake/Output Summary (Last 24 hours) at 01/12/12 0955 Last data filed at 01/12/12 0830  Gross per 24 hour  Intake   2350 ml  Output   2435 ml  Net    -85 ml    Exam:   General:  Calm and comfortable.  Eyes: Pupils equal, round, reactive to light. Normal lids, irises, conjunctiva.  ENT: Grossly normal hearing.  Neck: No lymphadenopathy or masses. No thyromegaly.  Cardiovascular: Regular rate and rhythm. No murmur, rub, gallop. No lower extremity edema.  Telemetry: Sinus rhythm. No acute changes.  Respiratory: Clear to auscultation bilaterally. No wheezes, rales, rhonchi. Normal respiratory effort.  Abdomen: Soft, nontender, nondistended.  Skin: Grossly normal.  Musculoskeletal: Grossly normal.  Psychiatric: Grossly normal mood and affect. Speech fluent and appropriate.  Neurologic: Grossly nonfocal  Data Reviewed: Basic Metabolic Panel:  Lab 01/12/12 4742 01/11/12 1517 01/11/12 1515  NA 137 135 --  K 3.5 2.8* --  CL 100 95* --  CO2 26 24 --  GLUCOSE 255* 196* --  BUN 10 14 --  CREATININE 0.78 0.79 --  CALCIUM 8.6 8.9 --  MG 1.8 -- 2.0  PHOS 3.3 -- --    Liver Function Tests:  Lab 01/12/12 0200 01/11/12 1517  AST 18 24  ALT 24 31  ALKPHOS 51 56  BILITOT 0.3 0.3  PROT 6.3 7.3  ALBUMIN 3.5 3.9    Lab 01/11/12 1517  LIPASE 41  AMYLASE --    Lab 01/11/12 1517  AMMONIA 34   CBC:  Lab 01/12/12 0200 01/11/12 1517  WBC 12.2* 11.3*  NEUTROABS -- 6.4  HGB 15.7 17.1*  HCT 42.4 47.2  MCV 90.8 91.1  PLT 190 201   Cardiac Enzymes:  Lab 01/11/12 1517  CKTOTAL 511*  CKMB 12.5*  CKMBINDEX --  TROPONINI <0.30   CBG:  Lab 01/11/12 1549  GLUCAP 193*    Recent Results (from the past 240 hour(s))  MRSA PCR SCREENING     Status: Normal   Collection Time   01/11/12  8:40 PM      Component Value Range Status Comment   MRSA by PCR NEGATIVE  NEGATIVE  Final      Studies: Dg Chest 2 View  01/11/2012  *RADIOLOGY REPORT*  Clinical Data: Altered mental status, shortness of breath  CHEST - 2 VIEW  Comparison: None.  Findings: No active infiltrate or effusion is seen.  Mediastinal contours appear normal.  The heart is within upper limits of normal.  There are mild degenerative changes throughout the thoracic spine.  IMPRESSION: No active lung disease.  Original Report  Authenticated By: Juline Patch, M.D.   Ct Head Wo Contrast  01/11/2012  *RADIOLOGY REPORT*  Clinical Data:  Altered mental status.  CT HEAD WITHOUT CONTRAST CT CERVICAL SPINE WITHOUT CONTRAST  Technique:  Multidetector CT imaging of the head and cervical spine was performed following the standard protocol without intravenous contrast.  Multiplanar CT image reconstructions of the cervical spine were also generated.  Comparison:   None  CT HEAD  Findings:  There is some cortical atrophy.  No evidence of acute abnormality including infarction, hemorrhage, mass lesion, mass effect, midline shift or abnormal extra-axial fluid collection.  No hydrocephalus or pneumocephalus.  Small bilateral mastoid effusions are noted.  There is some mucosal thickening in the left maxillary sinus.   Prominent venous lakes and vascular impressions are seen in the right frontal bone.  No worrisome calvarial lesion.  IMPRESSION:  1.  No acute finding. 2.  Mild cortical atrophy. 3.  Mucosal thickening left maxillary sinus and small bilateral mastoid effusions.  CT CERVICAL SPINE  Findings: There is no fracture or subluxation of the cervical spine.  The patient has a disc bulging and endplate spurring most notable C5-6 and C6-7.  Lung apices are clear.  IMPRESSION: No acute finding.  Degenerative disease.  Original Report Authenticated By: Bernadene Bell. D'ALESSIO, M.D.   Scheduled Meds:   . sodium chloride   Intravenous Once  . sodium chloride   Intravenous STAT  . enoxaparin  40 mg Subcutaneous Q24H  . folic acid  1 mg Oral Once  . folic acid  1 mg Oral Daily  . fomepizole (ANTIZOL) IV  1,400 mg Intravenous Once  . fomepizole (ANTIZOL) IV  10 mg/kg Intravenous Q12H  . LORazepam  0-4 mg Intravenous Q6H   Followed by  . LORazepam  0-4 mg Intravenous Q12H  . mulitivitamin with minerals  1 tablet Oral Daily  . nicotine  21 mg Transdermal Daily  . ondansetron (ZOFRAN) IV  4 mg Intravenous Once  . pantoprazole (PROTONIX) IV  40 mg Intravenous Once  . potassium chloride  10 mEq Intravenous Once  . potassium chloride  10 mEq Intravenous Q1 Hr x 3  . sodium chloride  1,000 mL Intravenous Once  . thiamine  100 mg Intravenous Once  . thiamine  100 mg Oral Daily   Or  . thiamine  100 mg Intravenous Daily   Continuous Infusions:   . sodium chloride 100 mL/hr at 01/12/12 0436     Assessment/Plan: 1. Acute encephalopathy: Resolved. Secondary to acute intoxication. 2. Hypothermia: Resolved. 3. Vomiting/possible hematemesis: Resolved. Secondary to alcoholic gastritis. 4. Anion gap metabolic acidosis/lactic acidosis: Resolved. I could secondary to ethanol. No evidence of methanol poisoning. 5. Possible methanol poisoning: Fomepizole was given initially. This was discussed in detail with poison  control. Per toxicologist Lorenda Ishihara, MD the next visit, but was all was held repeat electrolytes were obtained at 2 PM as well as repeat ethanol. These were unremarkable. Methanol level and isopropyl alcohol level obtained this morning were negative. Patient had no sequela. Case again discussed with portion control who advises no further evaluation or treatment.  6. Elevated CK-MB/total CK: Likely secondary to acute intoxication. 7. Alcohol intoxication: Resolved.   Code Status: Full code. Family Communication: Discussed with family in detail. Disposition Plan: Home.   Brendia Sacks, MD  [facility] Pager [phone] 01/12/2012, 9:55 AM    LOS: 1 day
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
## 2013-04-29 ENCOUNTER — Telehealth: Payer: Self-pay | Admitting: Physician Assistant

## 2013-04-30 MED ORDER — ATENOLOL 25 MG PO TABS: 50.0000 mg | ORAL_TABLET | Freq: Every day | ORAL | Status: DC

## 2013-04-30 MED ORDER — TRIAMTERENE-HCTZ 37.5-25 MG PO TABS: 1.0000 | ORAL_TABLET | Freq: Every day | ORAL | Status: DC

## 2013-04-30 NOTE — Telephone Encounter (Signed)
Pt has not been seen in over 1 year.  Spoke to wife. Told needs appt.  1 month only refills sent

## 2013-05-23 ENCOUNTER — Encounter: Payer: Self-pay | Admitting: Family Medicine

## 2013-05-29 ENCOUNTER — Encounter (HOSPITAL_COMMUNITY): Payer: Self-pay | Admitting: *Deleted

## 2013-05-29 ENCOUNTER — Other Ambulatory Visit: Payer: Self-pay

## 2013-05-29 ENCOUNTER — Emergency Department (HOSPITAL_COMMUNITY): Payer: Managed Care, Other (non HMO)

## 2013-05-29 ENCOUNTER — Inpatient Hospital Stay (HOSPITAL_COMMUNITY)
Admission: EM | Admit: 2013-05-29 | Discharge: 2013-05-31 | DRG: 247 | Disposition: A | Discharge status: Home / Self Care | Payer: Managed Care, Other (non HMO) | Attending: Cardiology | Admitting: Cardiology

## 2013-05-29 DIAGNOSIS — E119 Type 2 diabetes mellitus without complications: Secondary | ICD-10-CM | POA: Diagnosis present

## 2013-05-29 DIAGNOSIS — F17200 Nicotine dependence, unspecified, uncomplicated: Secondary | ICD-10-CM | POA: Diagnosis present

## 2013-05-29 DIAGNOSIS — Z720 Tobacco use: Secondary | ICD-10-CM | POA: Diagnosis present

## 2013-05-29 DIAGNOSIS — I214 Non-ST elevation (NSTEMI) myocardial infarction: Secondary | ICD-10-CM

## 2013-05-29 DIAGNOSIS — I10 Essential (primary) hypertension: Secondary | ICD-10-CM | POA: Diagnosis present

## 2013-05-29 DIAGNOSIS — I2510 Atherosclerotic heart disease of native coronary artery without angina pectoris: Principal | ICD-10-CM | POA: Diagnosis present

## 2013-05-29 DIAGNOSIS — E785 Hyperlipidemia, unspecified: Secondary | ICD-10-CM | POA: Diagnosis present

## 2013-05-29 DIAGNOSIS — Z79899 Other long term (current) drug therapy: Secondary | ICD-10-CM

## 2013-05-29 DIAGNOSIS — Z7982 Long term (current) use of aspirin: Secondary | ICD-10-CM

## 2013-05-29 DIAGNOSIS — E669 Obesity, unspecified: Secondary | ICD-10-CM | POA: Diagnosis present

## 2013-05-29 DIAGNOSIS — E876 Hypokalemia: Secondary | ICD-10-CM | POA: Diagnosis not present

## 2013-05-29 DIAGNOSIS — Z8589 Personal history of malignant neoplasm of other organs and systems: Secondary | ICD-10-CM

## 2013-05-29 DIAGNOSIS — I152 Hypertension secondary to endocrine disorders: Secondary | ICD-10-CM | POA: Diagnosis present

## 2013-05-29 HISTORY — DX: Tobacco use: Z72.0

## 2013-05-29 HISTORY — DX: Hyperlipidemia, unspecified: E78.5

## 2013-05-29 HISTORY — DX: Malignant (primary) neoplasm, unspecified: C80.1

## 2013-05-29 LAB — CBC
HCT: 40.8 % (ref 39.0–52.0)
Hemoglobin: 14.8 g/dL (ref 13.0–17.0)
MCH: 32.3 pg (ref 26.0–34.0)
MCV: 89.1 fL (ref 78.0–100.0)
Platelets: 149 10*3/uL — ABNORMAL LOW (ref 150–400)
RBC: 4.58 MIL/uL (ref 4.22–5.81)

## 2013-05-29 LAB — BASIC METABOLIC PANEL
BUN: 9 mg/dL (ref 6–23)
CO2: 26 mEq/L (ref 19–32)
Calcium: 8.9 mg/dL (ref 8.4–10.5)
Creatinine, Ser: 0.78 mg/dL (ref 0.50–1.35)
Glucose, Bld: 278 mg/dL — ABNORMAL HIGH (ref 70–99)

## 2013-05-29 MED ORDER — NITROGLYCERIN IN D5W 200-5 MCG/ML-% IV SOLN
5.0000 ug/min | INTRAVENOUS | Status: DC
  Administered 2013-05-30: 5 ug/min via INTRAVENOUS
  Filled 2013-05-29: qty 250

## 2013-05-29 MED ORDER — ASPIRIN 81 MG PO CHEW
324.0000 mg | CHEWABLE_TABLET | Freq: Once | ORAL | Status: AC
  Administered 2013-05-29: 324 mg via ORAL
  Filled 2013-05-29: qty 4

## 2013-05-29 MED ORDER — ONDANSETRON HCL 4 MG/2ML IJ SOLN
4.0000 mg | Freq: Once | INTRAMUSCULAR | Status: AC
  Administered 2013-05-29: 4 mg via INTRAVENOUS
  Filled 2013-05-29: qty 2

## 2013-05-29 NOTE — ED Provider Notes (Signed)
CSN: [id number]     Arrival date & time 05/29/13  2232 History     First MD Initiated Contact with Patient 05/29/13 2256     Chief Complaint  Patient presents with  . Chest Pain   (Consider location/radiation/quality/duration/timing/severity/associated sxs/prior Treatment) HPI 52 yo male presents to the ER from home with complaint of chest pain.  He has had CP intermittently for the last 2-3 weeks, usually when he is under a lot of stress.  He presents today b/c today is his day off and he has felt too fatigued to get anything done.  He reports SOB.  Pain is described as a pressure, heaviness, and is associated with sob.  No nausea, diaphoresis.  Sxs last about 5 -15 min, improve with rest, relaxation.  Pt is 1 ppd smoker since age 57.  Pt reports both parents had MIs, father starting in his 81s, has had 6 or 7 MI, mother died of MI at age 41.  No prior eval by cardiology.  Pt ran out of his BP meds 3 days ago b/c he needs a new physical.  Seen at [facility].    Past Medical History  Diagnosis Date  . Hypertension    History reviewed. No pertinent past surgical history. No family history on file. History  Substance Use Topics  . Smoking status: Current Every Day Smoker  . Smokeless tobacco: Not on file  . Alcohol Use: Yes    Review of Systems  All other systems reviewed and are negative.    Allergies  Ibuprofen and Penicillins  Home Medications   Current Outpatient Rx  Name  Route  Sig  Dispense  Refill  . atenolol (TENORMIN) 25 MG tablet   Oral   Take 2 tablets (50 mg total) by mouth daily.   30 tablet   0     Patient needs to be seen before any further refill ...   . naproxen sodium (ANAPROX) 220 MG tablet   Oral   Take 220 mg by mouth daily as needed (for pain).         . Potassium 99 MG TABS   Oral   Take 2 tablets by mouth daily.          . traMADol (ULTRAM) 50 MG tablet   Oral   Take 50 mg by mouth every 6 (six) hours as needed for pain.          Marland Kitchen triamterene-hydrochlorothiazide (DYAZIDE) 50-25 MG per capsule   Oral   Take 1 capsule by mouth daily.          BP 156/86  Pulse 96  Temp(Src) 97.6 F (36.4 C) (Oral)  Resp 16  SpO2 97% Physical Exam  Nursing note and vitals reviewed. Constitutional: He is oriented to person, place, and time. He appears well-developed and well-nourished.  HENT:  Head: Normocephalic and atraumatic.  Nose: Nose normal.  Mouth/Throat: Oropharynx is clear and moist.  Eyes: Conjunctivae and EOM are normal. Pupils are equal, round, and reactive to light.  Neck: Normal range of motion. Neck supple. No JVD present. No tracheal deviation present. No thyromegaly present.  Cardiovascular: Normal rate, regular rhythm, normal heart sounds and intact distal pulses.  Exam reveals no gallop and no friction rub.   No murmur heard. Pulmonary/Chest: Effort normal. No stridor. No respiratory distress. He has no wheezes. He has rales (bilateral bases). He exhibits no tenderness.  Abdominal: Soft. Bowel sounds are normal. He exhibits no distension and no mass.  There is no tenderness. There is no rebound and no guarding.  Musculoskeletal: Normal range of motion. He exhibits edema (1+ pitting edema bilaterally). He exhibits no tenderness.  Lymphadenopathy:    He has no cervical adenopathy.  Neurological: He is alert and oriented to person, place, and time. He exhibits normal muscle tone. Coordination normal.  Skin: Skin is warm and dry. No rash noted. No erythema. No pallor.  Psychiatric: He has a normal mood and affect. His behavior is normal. Judgment and thought content normal.    ED Course   Procedures (including critical care time)  CRITICAL CARE Performed by: Olivia Mackie Total critical care time: 30 Critical care time was exclusive of separately billable procedures and treating other patients. Critical care was necessary to treat or prevent imminent or life-threatening deterioration. Critical care  was time spent personally by me on the following activities: development of treatment plan with patient and/or surrogate as well as nursing, discussions with consultants, evaluation of patient's response to treatment, examination of patient, obtaining history from patient or surrogate, ordering and performing treatments and interventions, ordering and review of laboratory studies, ordering and review of radiographic studies, pulse oximetry and re-evaluation of patient's condition.   Labs Reviewed  CBC - Abnormal; Notable for the following:    MCHC 36.3 (*)    Platelets 149 (*)    All other components within normal limits  BASIC METABOLIC PANEL - Abnormal; Notable for the following:    Potassium 3.2 (*)    Glucose, Bld 278 (*)    All other components within normal limits  TROPONIN I - Abnormal; Notable for the following:    Troponin I 0.54 (*)    All other components within normal limits  TROPONIN I - Abnormal; Notable for the following:    Troponin I 0.88 (*)    All other components within normal limits  POCT I-STAT TROPONIN I - Abnormal; Notable for the following:    Troponin i, poc 0.35 (*)    All other components within normal limits  MRSA PCR SCREENING  PRO B NATRIURETIC PEPTIDE  PRO B NATRIURETIC PEPTIDE  HEPARIN LEVEL (UNFRACTIONATED)  TROPONIN I  TSH  HEMOGLOBIN A1C  MAGNESIUM  CBC  BASIC METABOLIC PANEL  PROTIME-INR  LIPID PANEL    Date: 05/30/2013  Rate: 96  Rhythm: normal sinus rhythm  QRS Axis: normal  Intervals: normal  ST/T Wave abnormalities: ST depressions laterally  Conduction Disutrbances:none  Narrative Interpretation:   Old EKG Reviewed: changes noted    No results found. 1. NSTEMI (non-ST elevated myocardial infarction)     MDM  52 yo male with NSTEMI.  EKG with ST depressions, but no elevations. D/w Dr Terressa Koyanagi with cardiology who will see patient in the ED.  NTG and heparin drips ordered.  Pt and family updated on findings and  plan.     Olivia Mackie, MD 05/30/13 [phone]

## 2013-05-29 NOTE — ED Notes (Signed)
I stat troponin results given to Dr. Judd Lien and Felipa Eth RN by B. Bing Plume, EMT

## 2013-05-29 NOTE — Progress Notes (Signed)
ANTICOAGULATION CONSULT NOTE - Initial Consult  Pharmacy Consult for Heparin Indication: chest pain/ACS  Allergies  Allergen Reactions  . Ibuprofen Nausea And Vomiting and Other (See Comments)    Causes Severe headaches  . Penicillins Diarrhea    Patient Measurements: Weight: (pt. stated) - 210 pounds Height: (pt. stated) - 67.5 inches Ideal Body Weight (IBW): 67.25 kg Adjusted Body Weight: 78.5 kg Percent over ibw: 42.01  Heparin Dosing Weight: 76 kg  Vital Signs: Temp: 97.6 F (36.4 C) (07/24 2242) Temp src: Oral (07/24 2242) BP: 156/86 mmHg (07/24 2318) Pulse Rate: 96 (07/24 2242)  Labs:  Recent Labs  05/29/13 2300  HGB 14.8  HCT 40.8  PLT 149*  CREATININE 0.78   Medical History: Past Medical History  Diagnosis Date  . Hypertension    Assessment: 52yo male with c/o chest pain that has been ongoing for the past 2 weeks.  His initial troponin is elevated and we have been asked to start him on some IV heparin.  He has some mild thrombocytopenia with platelets of 149K.  Goal of Therapy:  Heparin level 0.3-0.7 units/ml Monitor platelets by anticoagulation protocol: Yes   Plan:  1.  Heparin 4000 units IV bolus 2.  Heparin infusion at 1200 units/hr 3.  Obtain heparin level 6 hours after starting 4.  Daily heparin level/cbc 5.  Monitor for bleeding complications and thrombocytopenia  Nadara Mustard, PharmD., MS Clinical Pharmacist Pager:  [phone] Thank you for allowing pharmacy to be part of this patients care team. 05/29/2013,11:58 PM

## 2013-05-29 NOTE — ED Notes (Signed)
The pt has had chest pain for 2 weeks.  He feels like someone is sitting on his chest.  No sob no nausea no dizziness

## 2013-05-30 ENCOUNTER — Encounter (HOSPITAL_COMMUNITY)
Admission: EM | Disposition: A | Discharge status: Home / Self Care | Payer: Self-pay | Source: Home / Self Care | Attending: Cardiology

## 2013-05-30 ENCOUNTER — Encounter (HOSPITAL_COMMUNITY): Payer: Self-pay | Admitting: *Deleted

## 2013-05-30 DIAGNOSIS — I200 Unstable angina: Secondary | ICD-10-CM

## 2013-05-30 DIAGNOSIS — I2510 Atherosclerotic heart disease of native coronary artery without angina pectoris: Secondary | ICD-10-CM

## 2013-05-30 DIAGNOSIS — E785 Hyperlipidemia, unspecified: Secondary | ICD-10-CM | POA: Diagnosis present

## 2013-05-30 DIAGNOSIS — Z720 Tobacco use: Secondary | ICD-10-CM | POA: Diagnosis present

## 2013-05-30 DIAGNOSIS — I214 Non-ST elevation (NSTEMI) myocardial infarction: Secondary | ICD-10-CM

## 2013-05-30 DIAGNOSIS — E119 Type 2 diabetes mellitus without complications: Secondary | ICD-10-CM

## 2013-05-30 HISTORY — PX: LEFT HEART CATHETERIZATION WITH CORONARY ANGIOGRAM: SHX5451

## 2013-05-30 HISTORY — PX: PERCUTANEOUS CORONARY STENT INTERVENTION (PCI-S): SHX5485

## 2013-05-30 LAB — BASIC METABOLIC PANEL
CO2: 25 mEq/L (ref 19–32)
Calcium: 8.5 mg/dL (ref 8.4–10.5)
Creatinine, Ser: 0.8 mg/dL (ref 0.50–1.35)
GFR calc non Af Amer: >90 mL/min (ref >90)
Glucose, Bld: 148 mg/dL — ABNORMAL HIGH (ref 70–99)
Sodium: 138 mEq/L (ref 135–145)

## 2013-05-30 LAB — POCT ACTIVATED CLOTTING TIME
Activated Clotting Time: 201 seconds
Activated Clotting Time: 247 seconds

## 2013-05-30 LAB — LIPID PANEL
Cholesterol: 184 mg/dL (ref 0–200)
LDL Cholesterol: UNDETERMINED mg/dL (ref 0–99)
Total CHOL/HDL Ratio: 8 RATIO
Triglycerides: 544 mg/dL — ABNORMAL HIGH (ref <150)
VLDL: UNDETERMINED mg/dL (ref 0–40)

## 2013-05-30 LAB — CBC
Hemoglobin: 14 g/dL (ref 13.0–17.0)
MCH: 32.9 pg (ref 26.0–34.0)
MCHC: 36.8 g/dL — ABNORMAL HIGH (ref 30.0–36.0)
MCV: 89.2 fL (ref 78.0–100.0)
Platelets: 151 10*3/uL (ref 150–400)

## 2013-05-30 LAB — GLUCOSE, CAPILLARY
Glucose-Capillary: 124 mg/dL — ABNORMAL HIGH (ref 70–99)
Glucose-Capillary: 187 mg/dL — ABNORMAL HIGH (ref 70–99)

## 2013-05-30 LAB — TROPONIN I
Troponin I: 0.54 ng/mL (ref <0.30)
Troponin I: 0.88 ng/mL (ref <0.30)

## 2013-05-30 LAB — HEMOGLOBIN A1C
Hgb A1c MFr Bld: 7.1 % — ABNORMAL HIGH (ref <5.7)
Mean Plasma Glucose: 157 mg/dL — ABNORMAL HIGH (ref <117)

## 2013-05-30 LAB — HEPARIN LEVEL (UNFRACTIONATED): Heparin Unfractionated: <0.1 IU/mL — ABNORMAL LOW (ref 0.30–0.70)

## 2013-05-30 LAB — PROTIME-INR: Prothrombin Time: 13.2 seconds (ref 11.6–15.2)

## 2013-05-30 SURGERY — LEFT HEART CATHETERIZATION WITH CORONARY ANGIOGRAM
Anesthesia: LOCAL

## 2013-05-30 MED ORDER — NITROGLYCERIN 0.2 MG/ML ON CALL CATH LAB
INTRAVENOUS | Status: AC
  Filled 2013-05-30: qty 1

## 2013-05-30 MED ORDER — POTASSIUM CHLORIDE CRYS ER 20 MEQ PO TBCR
40.0000 meq | EXTENDED_RELEASE_TABLET | Freq: Once | ORAL | Status: AC
  Administered 2013-05-30: 19:00:00 40 meq via ORAL
  Filled 2013-05-30: qty 2

## 2013-05-30 MED ORDER — HEPARIN (PORCINE) IN NACL 2-0.9 UNIT/ML-% IJ SOLN
INTRAMUSCULAR | Status: AC
  Filled 2013-05-30: qty 1000

## 2013-05-30 MED ORDER — PRASUGREL HCL 10 MG PO TABS
10.0000 mg | ORAL_TABLET | Freq: Every day | ORAL | Status: DC
  Administered 2013-05-31: 10 mg via ORAL
  Filled 2013-05-30 (×2): qty 1

## 2013-05-30 MED ORDER — MIDAZOLAM HCL 2 MG/2ML IJ SOLN
INTRAMUSCULAR | Status: AC
  Filled 2013-05-30: qty 2

## 2013-05-30 MED ORDER — ATORVASTATIN CALCIUM 40 MG PO TABS
40.0000 mg | ORAL_TABLET | Freq: Every day | ORAL | Status: DC
  Administered 2013-05-30: 40 mg via ORAL
  Filled 2013-05-30 (×2): qty 1

## 2013-05-30 MED ORDER — SODIUM CHLORIDE 0.9 % IV SOLN
1.0000 mL/kg/h | INTRAVENOUS | Status: AC
  Administered 2013-05-30: 1 mL/kg/h via INTRAVENOUS

## 2013-05-30 MED ORDER — SODIUM CHLORIDE 0.9 % IJ SOLN: 3.0000 mL | INTRAMUSCULAR | Status: DC | PRN

## 2013-05-30 MED ORDER — HEPARIN (PORCINE) IN NACL 100-0.45 UNIT/ML-% IJ SOLN
1200.0000 [IU]/h | INTRAMUSCULAR | Status: DC
  Administered 2013-05-30: 1200 [IU]/h via INTRAVENOUS
  Filled 2013-05-30 (×2): qty 250

## 2013-05-30 MED ORDER — ACETAMINOPHEN 325 MG PO TABS: 650.0000 mg | ORAL_TABLET | ORAL | Status: DC | PRN

## 2013-05-30 MED ORDER — ATENOLOL 25 MG PO TABS
25.0000 mg | ORAL_TABLET | Freq: Every day | ORAL | Status: DC
  Administered 2013-05-30 – 2013-05-31 (×2): 25 mg via ORAL
  Filled 2013-05-30 (×3): qty 1

## 2013-05-30 MED ORDER — VERAPAMIL HCL 2.5 MG/ML IV SOLN
INTRAVENOUS | Status: AC
  Filled 2013-05-30: qty 2

## 2013-05-30 MED ORDER — TRAMADOL HCL 50 MG PO TABS: 50.0000 mg | ORAL_TABLET | Freq: Four times a day (QID) | ORAL | Status: DC | PRN

## 2013-05-30 MED ORDER — ASPIRIN EC 81 MG PO TBEC
81.0000 mg | DELAYED_RELEASE_TABLET | Freq: Every day | ORAL | Status: DC
  Administered 2013-05-31: 10:00:00 81 mg via ORAL
  Filled 2013-05-30: qty 1

## 2013-05-30 MED ORDER — ATORVASTATIN CALCIUM 80 MG PO TABS
80.0000 mg | ORAL_TABLET | Freq: Every day | ORAL | Status: DC
  Administered 2013-05-30: 80 mg via ORAL
  Filled 2013-05-30 (×2): qty 1

## 2013-05-30 MED ORDER — HEPARIN SODIUM (PORCINE) 1000 UNIT/ML IJ SOLN
INTRAMUSCULAR | Status: AC
  Filled 2013-05-30: qty 1

## 2013-05-30 MED ORDER — ONDANSETRON HCL 4 MG/2ML IJ SOLN: 4.0000 mg | Freq: Four times a day (QID) | INTRAMUSCULAR | Status: DC | PRN

## 2013-05-30 MED ORDER — SODIUM CHLORIDE 0.9 % IJ SOLN
3.0000 mL | Freq: Two times a day (BID) | INTRAMUSCULAR | Status: DC
  Administered 2013-05-30: 3 mL via INTRAVENOUS

## 2013-05-30 MED ORDER — SODIUM CHLORIDE 0.9 % IJ SOLN: 3.0000 mL | Freq: Two times a day (BID) | INTRAMUSCULAR | Status: DC

## 2013-05-30 MED ORDER — LIVING WELL WITH DIABETES BOOK
Freq: Once | Status: AC
  Administered 2013-05-30: 15:00:00
  Filled 2013-05-30: qty 1

## 2013-05-30 MED ORDER — ASPIRIN 81 MG PO CHEW: 324.0000 mg | CHEWABLE_TABLET | ORAL | Status: DC

## 2013-05-30 MED ORDER — HEPARIN BOLUS VIA INFUSION
4000.0000 [IU] | Freq: Once | INTRAVENOUS | Status: AC
  Administered 2013-05-30: 4000 [IU] via INTRAVENOUS

## 2013-05-30 MED ORDER — ALUM & MAG HYDROXIDE-SIMETH 200-200-20 MG/5ML PO SUSP
30.0000 mL | ORAL | Status: DC | PRN
  Administered 2013-05-30: 04:00:00 30 mL via ORAL
  Filled 2013-05-30: qty 30

## 2013-05-30 MED ORDER — MORPHINE SULFATE 2 MG/ML IJ SOLN: 2.0000 mg | INTRAMUSCULAR | Status: DC | PRN

## 2013-05-30 MED ORDER — SODIUM CHLORIDE 0.9 % IV SOLN
250.0000 mL | INTRAVENOUS | Status: DC | PRN
  Administered 2013-05-30: 250 mL via INTRAVENOUS

## 2013-05-30 MED ORDER — SODIUM CHLORIDE 0.9 % IV SOLN: 250.0000 mL | INTRAVENOUS | Status: DC | PRN

## 2013-05-30 MED ORDER — FENTANYL CITRATE 0.05 MG/ML IJ SOLN
INTRAMUSCULAR | Status: AC
  Filled 2013-05-30: qty 2

## 2013-05-30 MED ORDER — SODIUM CHLORIDE 0.9 % IV SOLN: 1.0000 mL/kg/h | INTRAVENOUS | Status: DC

## 2013-05-30 MED ORDER — LIDOCAINE HCL (PF) 1 % IJ SOLN
INTRAMUSCULAR | Status: AC
  Filled 2013-05-30: qty 30

## 2013-05-30 MED ORDER — INSULIN ASPART 100 UNIT/ML ~~LOC~~ SOLN
0.0000 [IU] | Freq: Three times a day (TID) | SUBCUTANEOUS | Status: DC
  Administered 2013-05-30 – 2013-05-31 (×3): 3 [IU] via SUBCUTANEOUS

## 2013-05-30 NOTE — Progress Notes (Signed)
AM labs back and reviewed.  Fasting glucose 148.  A1c pending.  Will add SSI and ask diabetes mgmt to see.

## 2013-05-30 NOTE — Progress Notes (Signed)
TR BAND REMOVAL  LOCATION:    right radial  DEFLATED PER PROTOCOL:    yes  TIME BAND OFF / DRESSING APPLIED:    1730   SITE UPON ARRIVAL:    Level 0  SITE AFTER BAND REMOVAL:    Level 0  REVERSE ALLEN'S TEST:     positive  CIRCULATION SENSATION AND MOVEMENT:    Within Normal Limits   yes  COMMENTS:   Dressing assessed at 1800,  dry and intact with no change in assessment noted. CSMs wnls and right radial and ulnar pulse +2.

## 2013-05-30 NOTE — Progress Notes (Addendum)
Inpatient Diabetes Program Recommendations  AACE/ADA: New Consensus Statement on Inpatient Glycemic Control (2013)  Target Ranges:  Prepandial:   less than 140 mg/dL      Peak postprandial:   less than 180 mg/dL (1-2 hours)      Critically ill patients:  140 - 180 mg/dL     Admitted with MI.  Asked by NP, Roby Lofts to see patient.  Currently awaiting A1c results.  Spoke with patient extensively about the possibility that he has diabetes.  Explained to patient that we are waiting for his A1c results to come back and that if his A1c is 6.5% or > that he does indeed have a +diagonsis of DM.  Also explained to patient that if his A1c is 6-6.4% that he is at high risk for DM.  Explained what an A1c is and what it measures/means.  Spoke with pt about DM.  Explained basic pathophysiology of DM Type 2, basic home care, importance of checking CBGs and maintaining good CBG control to prevent long-term and short-term complications.  RNs to provide ongoing basic DM education at bedside with this patient.  Have ordered educational booklet for this patient.  Will order RD consult for DM diet education once A1c results are final.  Patient appeared extremely anxious during our conversation.  Once patient's wife arrived, he seemed to be more calm and able to listen to what I had to say.  RN, Vernona Rieger alerted to this fact.     Will place Outpatient DM Education referral for this patient to the [facility] Nutrition and DM Management center.  Addendum 2:51pm- Noted A1c 7.1%.  + Diagnosis of DM.  Placed appropriate educational orders.  RNs to provide continuing DM education with patient and family at bedside.  MD- Recommend initiation of oral medications for home.  Patient will need follow up with his PCP at [facility].  Could initiate Metformin at time of discharge after IV contrast has safely left system.   Will follow. Ambrose Finland RN, MSN, CDE Diabetes Coordinator Inpatient  Diabetes Program [phone]

## 2013-05-30 NOTE — Progress Notes (Signed)
Utilization Review Completed Nattie Lazenby J. Lawrnce Reyez, RN, BSN, NCM [phone]  

## 2013-05-30 NOTE — Interval H&P Note (Signed)
Cath Lab Visit (complete for each Cath Lab visit)  Clinical Evaluation Leading to the Procedure:   ACS: yes  Non-ACS:    Anginal Classification: CCS III  Anti-ischemic medical therapy: Maximal Therapy (2 or more classes of medications)  Non-Invasive Test Results: No non-invasive testing performed  Prior CABG: No previous CABG      History and Physical Interval Note:  05/30/2013 1:00 PM  Todd [name]  has presented today for surgery, with the diagnosis of cp  The various methods of treatment have been discussed with the patient and family. After consideration of risks, benefits and other options for treatment, the patient has consented to  Procedure(s): LEFT HEART CATHETERIZATION WITH CORONARY ANGIOGRAM (N/A) as a surgical intervention .  The patient's history has been reviewed, patient examined, no change in status, stable for surgery.  I have reviewed the patient's chart and labs.  Questions were answered to the patient's satisfaction.     Charlton Haws

## 2013-05-30 NOTE — CV Procedure (Signed)
   CARDIAC CATH NOTE  Name: Todd [name] MRN: [id number] DOB: 09-May-1961  Procedure: PTCA and stenting of the mid LAD  Indication: 52 yo WM with history of HTN, Hyperlipidemia, tobacco abuse and obesity presents with a NSTEMI. Diagnostic cardiac cath showed occlusion of a nondominant RCA. The LAD had a 99% mid vessel stenosis with a long segment of disease.  Procedural Details:  A 6 Fr sheath was used in the right radial artery. Weight-based heparin was given for anticoagulation. Effient 60 mg was given orally. Once a therapeutic ACT was achieved, a 6 [place] XBLAD 3.5 guide catheter was inserted.  A prowater coronary guidewire was used to cross the lesion.  The lesion was predilated with a 2.5 mm balloon.  The lesion was then stented with a 3.0 x 32 mm Promus stent.  The stent was postdilated with a 3.25 mm noncompliant balloon.  Following PCI, there was 0% residual stenosis and TIMI-3 flow. Final angiography confirmed an excellent result. The patient tolerated the procedure well. There were no immediate procedural complications. A TR band was used for radial hemostasis. The patient was transferred to the post catheterization recovery area for further monitoring.  Lesion Data: Vessel: mid LAD Percent stenosis (pre): 99% TIMI-flow (pre):  2 Stent:  3.0 x 32 mm Promus stent Percent stenosis (post): 0% TIMI-flow (post): 3  Conclusions: Successful stenting of the LAD with a DES.  Recommendations: Dual antiplatelet therapy for one year.   Theron Arista [facility] 05/30/2013, 2:16 PM

## 2013-05-30 NOTE — H&P (Signed)
Cardiology History and Physical  Provider Not In System  History of Present Illness (and review of medical records): Todd [name] is a 52 y.o. male who presents for evaluation of chest pain.  He has hx of HTN, dyslipidemia and denies hx of piror MI or known CAD.  He reports today he has had generalized malaise.  The had episode of chest pressure 5/10 with no associated symptoms.  He has had at least two prior episodes.  Daughter at beside states that he may have had more, but does not tell family.  He did have one episode back on July 7th while at work that was severe 8/10 and felt like a "charlie horse" in his left chest.  His boss called EMS and he was evaluated at work.  He was not taken to the ED at that time, but was sent home.  He states symptoms lasted around an hour.  He recently called his PCP to have his BP meds refilled and reports being out for only 1 to 2 days. He otherwise denies any shortness of breath, nausea, diaphoresis, palpitations, presyncope or syncope.   Previous diagnostic testing for coronary artery disease includes: stress thallium. Previous history of cardiac disease includes None. Coronary artery disease risk factors include: dyslipidemia, hypertension, male gender, obesity (BMI >= 30 kg/m2) and smoking/ tobacco exposure. Patient denies history of CHF, ischemic heart disease, previous M.I. and valvular disease.  Review of Systems Further review of systems was otherwise negative other than stated in HPI.  Patient Active Problem List   Diagnosis Date Noted  . Hypertension 01/11/2012  . 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 Nausea and vomiting 01/11/2012  . Hypokalemia 01/11/2012   Past Medical History  Diagnosis Date  . Hypertension     History reviewed. No pertinent past surgical history.   (Not in a hospital admission) Allergies  Allergen Reactions  . Ibuprofen Nausea And Vomiting and Other (See Comments)    Causes Severe headaches  . Penicillins Diarrhea     History  Substance Use Topics  . Smoking status: Current Every Day Smoker  . Smokeless tobacco: Not on file  . Alcohol Use: Yes    No family history on file.   Objective:  Patient Vitals for the past 8 hrs:  BP Temp Temp src Pulse Resp SpO2  05/29/13 2318 156/86 mmHg - - - 16 97 %  05/29/13 2242 185/82 mmHg 97.6 F (36.4 C) Oral 96 18 97 %   General appearance: alert, cooperative, appears stated age and no distress Head: Normocephalic, without obvious abnormality, atraumatic Eyes: conjunctivae/corneas clear. PERRL, EOM's intact. Fundi benign. Neck: no carotid bruit, no JVD and supple, symmetrical, trachea midline Lungs: clear to auscultation bilaterally Chest wall: no tenderness Heart: regular rate and rhythm, S1, S2 normal, no murmur, click, rub or gallop Abdomen: soft, non-tender; bowel sounds normal; no masses,  no organomegaly Extremities: extremities normal, atraumatic, no cyanosis , 1+ BLE edema Pulses: 2+ and symmetric Neurologic: Grossly normal  Results for orders placed during the hospital encounter of 05/29/13 (from the past 48 hour(s))  CBC     Status: Abnormal   Collection Time    05/29/13 11:00 PM      Result Value Range   WBC 8.0  4.0 - 10.5 K/uL   RBC 4.58  4.22 - 5.81 MIL/uL   Hemoglobin 14.8  13.0 - 17.0 g/dL   HCT 16.1  09.6 - 04.5 %   MCV 89.1  78.0 - 100.0 fL   MCH  32.3  26.0 - 34.0 pg   MCHC 36.3 (*) 30.0 - 36.0 g/dL   RDW 16.1  09.6 - 04.5 %   Platelets 149 (*) 150 - 400 K/uL  BASIC METABOLIC PANEL     Status: Abnormal   Collection Time    05/29/13 11:00 PM      Result Value Range   Sodium 136  135 - 145 mEq/L   Potassium 3.2 (*) 3.5 - 5.1 mEq/L   Chloride 100  96 - 112 mEq/L   CO2 26  19 - 32 mEq/L   Glucose, Bld 278 (*) 70 - 99 mg/dL   BUN 9  6 - 23 mg/dL   Creatinine, Ser 4.09  0.50 - 1.35 mg/dL   Calcium 8.9  8.4 - 81.1 mg/dL   GFR calc non Af Amer >90  >90 mL/min   GFR calc Af Amer >90  >90 mL/min   Comment:            The eGFR has  been calculated     using the CKD EPI equation.     This calculation has not been     validated in all clinical     situations.     eGFR's persistently     <90 mL/min signify     possible Chronic Kidney Disease.  PRO B NATRIURETIC PEPTIDE     Status: None   Collection Time    05/29/13 11:00 PM      Result Value Range   Pro B Natriuretic peptide (BNP) 71.4  0 - 125 pg/mL  POCT I-STAT TROPONIN I     Status: Abnormal   Collection Time    05/29/13 11:07 PM      Result Value Range   Troponin i, poc 0.35 (*) 0.00 - 0.08 ng/mL   Comment NOTIFIED PHYSICIAN     Comment 3            Comment: Due to the release kinetics of cTnI,     a negative result within the first hours     of the onset of symptoms does not rule out     myocardial infarction with certainty.     If myocardial infarction is still suspected,     repeat the test at appropriate intervals.   Dg Chest Port 1 View  05/30/2013   *RADIOLOGY REPORT*  Clinical Data: Chest pain. NSTEMI  PORTABLE CHEST - 1 VIEW  Comparison: 01/11/2012  Findings: Shallow inspiration.  Heart size and pulmonary vascularity are normal for technique.  No focal airspace disease or consolidation in the lungs.  No blunting of costophrenic angles. No pneumothorax.  Mediastinal contours appear intact.  No significant change since previous study.  IMPRESSION: No evidence of active pulmonary disease.   Original Report Authenticated By: Burman Nieves, M.D.    ECG:  Sinus rhythm HR 96 nonspecific intraventricular conduction delay, nonspecific ST changes,No significant changes from prior ecgs reviewed.  Assessment: 20M presents with recurrent chest pain, nonspecific ecg changes, and mildly positive troponins concerning for ACS/NSTEMI.  Will plan admission to stepdown.  Will keep NPO for likely ischemic evaluation with cardiac catheterization in the am.  ACS/NSTEMI HTN Dyslipidemia Tobacco abuse  Plan:  1. Cardiology Admission  2. Continuous monitoring on  Telemetry. 3. Repeat ekg on admit, prn chest pain or arrythmia 4. Trend cardiac biomarkers, check lipids, hgba1c, tsh, check urine drug screen. 5. Medical management to include ASA, Heparin gtt, BB, Statin, NTG prn 6. Keep NPO for likely ischemic  evaluation with cardiac catheterization in the am. 7. Discussed plan with patient and family at bedside.

## 2013-05-30 NOTE — Plan of Care (Signed)
Problem: Food- and Nutrition-Related Knowledge Deficit (NB-1.1) Goal: Nutrition education Formal process to instruct or train a patient/client in a skill or to impart knowledge to help patients/clients voluntarily manage or modify food choices and eating behavior to maintain or improve health. Outcome: Completed/Met Date Met:  05/30/13 Recommend outpatient education.  RD consulted for nutrition education regarding diabetes. Pt with new Dx of DM. Pt did not seem very receptive to education . Wife and daughter present with lots of questions. Pt seemed to be argumentative with some of the information provided.     Lab Results  Component Value Date    HGBA1C 7.1* 05/30/2013    RD provided "Carbohydrate Counting for People with Diabetes" handout from the Academy of Nutrition and Dietetics. Discussed different food groups and their effects on blood sugar, emphasizing carbohydrate-containing foods. Provided list of carbohydrates and recommended serving sizes of common foods.  Discussed importance of controlled and consistent carbohydrate intake throughout the day. Provided examples of ways to balance meals/snacks and encouraged intake of high-fiber, whole grain complex carbohydrates. Teach back method used.  Expect fair compliance.  Body mass index is 33.35 kg/(m^2). Pt meets criteria for Obesity Class I based on current BMI.  Current diet order is Clear Liquids, patient is consuming approximately 100% of meals at this time. Labs and medications reviewed. No further nutrition interventions warranted at this time. RD contact information provided. If additional nutrition issues arise, please re-consult RD.  Kendell Bane RD, LDN, CNSC [phone] Pager [phone] After Hours Pager

## 2013-05-30 NOTE — Progress Notes (Signed)
Patient transferred to cath lab via bed with nurse and transporter.

## 2013-05-30 NOTE — Progress Notes (Signed)
Patient Name: Todd [name] Date of Encounter: 05/30/2013   Principal Problem:   NSTEMI (non-ST elevated myocardial infarction) Active Problems:   Hypertension   Hyperlipidemia   Tobacco abuse   SUBJECTIVE  Pt presented yesterday with a 2 wk h/o exertional chest discomfort.  He has ruled in for NSTEMI.  We had a long discussion re: ACS mgmt and risk factors for CAD.  He is currently pain free and on heparin and NTG.  CURRENT MEDS . [START ON 05/31/2013] aspirin EC  81 mg Oral Daily  . atenolol  25 mg Oral Daily  . atorvastatin  40 mg Oral q1800  . sodium chloride  3 mL Intravenous Q12H   OBJECTIVE  Filed Vitals:   05/30/13 0530 05/30/13 0600 05/30/13 0630 05/30/13 0700  BP: 121/90 120/64 124/75 111/74  Pulse: 80     Temp:      TempSrc:      Resp: 27 20 26 14   Height:      Weight: 214 lb 11.7 oz (97.4 kg)     SpO2: 95%       Intake/Output Summary (Last 24 hours) at 05/30/13 0731 Last data filed at 05/30/13 0700  Gross per 24 hour  Intake 293.48 ml  Output    625 ml  Net -331.52 ml   Filed Weights   05/30/13 0109 05/30/13 0530  Weight: 215 lb 2.7 oz (97.6 kg) 214 lb 11.7 oz (97.4 kg)   PHYSICAL EXAM  General: Pleasant, NAD. Neuro: Alert and oriented X 3. Moves all extremities spontaneously. Psych: Normal affect. HEENT:  Normal  Neck: Supple without bruits or JVD. Lungs:  Resp regular and unlabored, diminished breath sounds bilat with bibasilar crackles. Heart: RRR no s3, s4, or murmurs. Abdomen: Soft, non-tender, non-distended, BS + x 4.  Extremities: No clubbing, cyanosis or edema. DP/PT/Radials 2+ and equal bilaterally.  Accessory Clinical Findings  CBC  Recent Labs  05/29/13 2300  WBC 8.0  HGB 14.8  HCT 40.8  MCV 89.1  PLT 149*   Basic Metabolic Panel  Recent Labs  05/29/13 2300  NA 136  K 3.2*  CL 100  CO2 26  GLUCOSE 278*  BUN 9  CREATININE 0.78  CALCIUM 8.9   Cardiac Enzymes  Recent Labs  05/29/13 2300 05/30/13 0206    TROPONINI 0.54* 0.88*   TELE  rsr  ECG  Rsr, 96, nonspec lat st changes.  Radiology/Studies  Dg Chest Port 1 View  05/30/2013   *RADIOLOGY REPORT*  Clinical Data: Chest pain. NSTEMI  PORTABLE CHEST - 1 VIEW  Comparison: 01/11/2012  Findings: Shallow inspiration.  Heart size and pulmonary vascularity are normal for technique.  No focal airspace disease or consolidation in the lungs.  No blunting of costophrenic angles. No pneumothorax.  Mediastinal contours appear intact.  No significant change since previous study.  IMPRESSION: No evidence of active pulmonary disease.   Original Report Authenticated By: Burman Nieves, M.D.    ASSESSMENT AND PLAN  1.  NSTEMI:  Pt presents with a  2 wk h/o exertional chest discomfort that was more persistent yesterday.  Troponin has risen to 0.88.  He is currently pain free on heparin/ntg/bb/statin.  After a long discussion, pt is willing to proceed with diagnostic catheterization.  The patient understands that risks include but are not limited to stroke (1 in 1000), death (1 in 1000), kidney failure [usually temporary] (1 in 500), bleeding (1 in 200), allergic reaction [possibly serious] (1 in 200), and agrees to proceed.  Cont current meds.  He has a listed intolerance to ASA 2/2 nausea.  Will continue and follow.  2.  HTN:  Stable.  3.  HL:  Check lipids/lft's.  Increase lipitor to 80.  4.  Tob Abuse:  Cessation advised.  5.  Hypokalemia:  supp.  Signed, Nicolasa Ducking NP   History and all data above reviewed.  Patient examined.  I agree with the findings as above. Non Q wave MI.   The patient exam reveals COR:RRR  ,  Lungs: Clear  ,  Abd: Positive bowel sounds, no rebound no guarding, Ext No edema  .  All available labs, radiology testing, previous records reviewed. Agree with documented assessment and plan. Cath today.  The patient understands that risks included but are not limited to stroke (1 in 1000), death (1 in 1000), kidney failure  [usually temporary] (1 in 500), bleeding (1 in 200), allergic reaction [possibly serious] (1 in 200).  The patient understands and agrees to proceed.   Rollene Rotunda  8:52 AM  05/30/2013

## 2013-05-30 NOTE — CV Procedure (Signed)
   Cardiac Catheterization Procedure Note  Name: Todd [name] MRN: [id number] DOB: Aug 24, 1961  Procedure: Left Heart Cath, Selective Coronary Angiography, LV angiography  Indication:  SEMI   Procedural Details: The right wrist was prepped, draped, and anesthetized with 1% lidocaine. Using the modified Seldinger technique, a 5 French sheath was introduced into the right radial artery. 3 mg of verapamil was administered through the sheath, weight-based unfractionated heparin was administered intravenously. Standard Judkins catheters were used for selective coronary angiography and left ventriculography. Catheter exchanges were performed over an exchange length guidewire. There were no immediate procedural complications. A TR band was used for radial hemostasis at the completion of the procedure.  The patient was transferred to the post catheterization recovery area for further monitoring.  Procedural Findings: Hemodynamics: AO  107/65 LV 119/11   Coronary angiography: Coronary dominance: right  Left mainstem:  Normal  Left anterior descending (LAD):  Mid vessel 95% after forst D1  D1: normal  Left circumflex (LCx):  Left dominant normal  OM1: normal  OM2: normal  PDA: normal  Right coronary artery (RCA): codominant or non 100% ostial with left to right collaterals   Left ventriculography: Left ventricular systolic function is normal, LVEF is estimated at 55-65%, there is no significant mitral regurgitation   Final Conclusions:  Mid LAD culprit Chronic RCA with collaterals  Will reviewe with Dr [place] PCI/Stent LAD  Recommendations: Stenting   Charlton Haws 05/30/2013, 1:31 PM

## 2013-05-31 ENCOUNTER — Other Ambulatory Visit: Payer: Self-pay | Admitting: Family Medicine

## 2013-05-31 DIAGNOSIS — F17200 Nicotine dependence, unspecified, uncomplicated: Secondary | ICD-10-CM

## 2013-05-31 DIAGNOSIS — Z0000 Encounter for general adult medical examination without abnormal findings: Secondary | ICD-10-CM

## 2013-05-31 DIAGNOSIS — E785 Hyperlipidemia, unspecified: Secondary | ICD-10-CM

## 2013-05-31 DIAGNOSIS — I10 Essential (primary) hypertension: Secondary | ICD-10-CM

## 2013-05-31 LAB — CBC
Hemoglobin: 13.5 g/dL (ref 13.0–17.0)
RBC: 4.13 MIL/uL — ABNORMAL LOW (ref 4.22–5.81)
WBC: 8.9 10*3/uL (ref 4.0–10.5)

## 2013-05-31 LAB — COMPREHENSIVE METABOLIC PANEL
CO2: 26 mEq/L (ref 19–32)
Calcium: 8.6 mg/dL (ref 8.4–10.5)
Creatinine, Ser: 0.85 mg/dL (ref 0.50–1.35)
GFR calc Af Amer: >90 mL/min (ref >90)
GFR calc non Af Amer: >90 mL/min (ref >90)
Glucose, Bld: 144 mg/dL — ABNORMAL HIGH (ref 70–99)

## 2013-05-31 LAB — GLUCOSE, CAPILLARY: Glucose-Capillary: 175 mg/dL — ABNORMAL HIGH (ref 70–99)

## 2013-05-31 MED ORDER — POTASSIUM CHLORIDE ER 10 MEQ PO TBCR: 20.0000 meq | EXTENDED_RELEASE_TABLET | Freq: Two times a day (BID) | ORAL | Status: DC

## 2013-05-31 MED ORDER — PRASUGREL HCL 10 MG PO TABS: 10.0000 mg | ORAL_TABLET | Freq: Every day | ORAL | Status: DC

## 2013-05-31 MED ORDER — ASPIRIN 81 MG PO TBEC: 81.0000 mg | DELAYED_RELEASE_TABLET | Freq: Every day | ORAL | Status: DC

## 2013-05-31 MED ORDER — NITROGLYCERIN 0.4 MG SL SUBL: 0.4000 mg | SUBLINGUAL_TABLET | SUBLINGUAL | Status: DC | PRN

## 2013-05-31 MED ORDER — ATORVASTATIN CALCIUM 80 MG PO TABS: 80.0000 mg | ORAL_TABLET | Freq: Every day | ORAL | Status: DC

## 2013-05-31 NOTE — Discharge Summary (Addendum)
Physician Discharge Summary  Patient ID: Todd [name] MRN: [id number] DOB/AGE: 54-Mar-1962 39 y.o.  Admit date: 05/29/2013 Discharge date: 05/31/2013  Primary Cardiologist: [place] (new)  Primary Discharge Diagnosis: 1 CAD  - NSTEMI/DES 99% mid LAD  - 100% non dominant RCA with distal collateralization  - EF 55-65%  2 DM  - newly diagnosed  Secondary Discharge Diagnoses: Past Medical History  Diagnosis Date  . Hypertension   . 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 Tobacco abuse   . Hyperlipidemia     Reason for Admission: 52 year old male, with no prior history of ischemic heart disease, who presented to the ED with symptoms and findings consistent with unstable angina pectoris.  Procedures: Cardiac Catheterization, 05/30/2013: Procedural Findings:  Hemodynamics:  AO 107/65  LV 119/11  Coronary angiography:  Coronary dominance: right  Left mainstem: Normal  Left anterior descending (LAD): Mid vessel 95% after forst D1  D1: normal  Left circumflex (LCx): Left dominant normal  OM1: normal  OM2: normal  PDA: normal  Right coronary artery (RCA): codominant or non 100% ostial with left to right collaterals  Left ventriculography: Left ventricular systolic function is normal, LVEF is estimated at 55-65%, there is no significant mitral regurgitation  Final Conclusions: Mid LAD culprit Chronic RCA with collaterals Will reviewe with Dr [place] PCI/Stent LAD  Recommendations: Stenting    Hospital Course: Patient was stabilized on medical therapy, including IV heparin, beta blocker, and statin, and cleared to proceed with diagnostic coronary angiography, with results as outlined above. He underwent same day percutaneous intervention with no noted complications, and was cleared for discharge the following day in hemodynamically stable condition. Peak troponin 1.3, on admission.  Patient will require dual antiplatelet therapy with ASA and Effient for one year.  Of note, patient was found  to have an A1c level of 7.1. He was referred for inpatient diabetes evaluation, with recommendation to initiate medical therapy, possibly metformin. Final recommendations are to defer initiation of medical therapy to the primary M.D.   Discharge Vitals: Blood pressure 189/88, pulse 73, temperature 97.8 F (36.6 C), temperature source Oral, resp. rate 18, height 5' 7.5" (1.715 m), weight 216 lb 4.3 oz (98.1 kg), SpO2 98.00%.  Labs: Lab Results  Component Value Date   WBC 8.9 05/31/2013   HGB 13.5 05/31/2013   HCT 37.3* 05/31/2013   MCV 90.3 05/31/2013   PLT 145* 05/31/2013      Recent Labs Lab 05/31/13 0415  NA 136  K 3.8  CL 102  CO2 26  BUN 9  CREATININE 0.85  CALCIUM 8.6  ALBUMIN 3.3*  PROT 5.9*  BILITOT 0.4  ALKPHOS 63  ALT 23  AST 17  GLUCOSE 144*    Lab Results  Component Value Date   CHOL 184 05/30/2013   HDL 23* 05/30/2013   LDLCALC UNABLE TO CALCULATE IF TRIGLYCERIDE OVER 400 mg/dL 02/12/8118   TRIG 147* 07/04/5620    No results found for this basename: DDIMER    Lab Results  Component Value Date   TSH 1.967 05/30/2013     Recent Labs  05/29/13 2300 05/30/13 0206 05/30/13 0721  TROPONINI 0.54* 0.88* 1.34*    Diagnostic Studies: Dg Chest Port 1 View  05/30/2013   *RADIOLOGY REPORT*  Clinical Data: Chest pain. NSTEMI  PORTABLE CHEST - 1 VIEW  Comparison: 01/11/2012  Findings: Shallow inspiration.  Heart size and pulmonary vascularity are normal for technique.  No focal airspace disease or consolidation in the lungs.  No  blunting of costophrenic angles. No pneumothorax.  Mediastinal contours appear intact.  No significant change since previous study.  IMPRESSION: No evidence of active pulmonary disease.   Original Report Authenticated By: Burman Nieves, M.D.     DISPOSITION: Stable condition  FOLLOW UP PLANS AND APPOINTMENTS: Discharge Orders   Future Appointments Provider Department Dept Phone   06/04/2013 8:00 AM [facility] [phone]   06/06/2013 11:15 AM Donita Brooks, MD [facility] [phone]   Future Orders Complete By Expires     Ambulatory referral to Nutrition and Diabetic Education  As directed     Comments:      Newly diagnosed with DM.  A1c 7.1% (05/30/13).  MI/Cardiac cath this hospitalization.    Patient: Please call [facility] after discharge to schedule an appointment for diabetes education if you do not hear from the center before discharge  [phone]    Diet - low sodium heart healthy  As directed     Diet - low sodium heart healthy  As directed     Increase activity slowly  As directed     Increase activity slowly  As directed           Follow-up Information   Follow up with Peter Sw[place]and, MD In 2 weeks. (office to call and arrange)    Contact information:   [address] [place] [address] [phone]       DISCHARGE MEDICATIONS:   Medication List    STOP taking these medications       naproxen sodium 220 MG tablet  Commonly known as:  ANAPROX     Potassium 99 MG Tabs      TAKE these medications       aspirin 81 MG EC tablet  Take 1 tablet (81 mg total) by mouth daily.     atenolol 25 MG tablet  Commonly known as:  TENORMIN  Take 2 tablets (50 mg total) by mouth daily.     atorvastatin 80 MG tablet  Commonly known as:  LIPITOR  Take 1 tablet (80 mg total) by mouth daily at 6 PM.     nitroGLYCERIN 0.4 MG SL tablet  Commonly known as:  NITROSTAT  Place 1 tablet (0.4 mg total) under the tongue every 5 (five) minutes as needed for chest pain.     potassium chloride 10 MEQ tablet  Commonly known as:  K-DUR  Take 2 tablets (20 mEq total) by mouth 2 (two) times daily.     prasugrel 10 MG Tabs  Commonly known as:  EFFIENT  Take 1 tablet (10 mg total) by mouth daily.     traMADol 50 MG tablet  Commonly known as:  ULTRAM  Take 50 mg by mouth every 6 (six) hours  as needed for pain.     triamterene-hydrochlorothiazide 50-25 MG per capsule  Commonly known as:  DYAZIDE  Take 1 capsule by mouth daily.        BRING ALL MEDICATIONS WITH YOU TO FOLLOW UP APPOINTMENTS  Time spent with patient to include physician time: Greater than 30 minutes, including physician time.  SignedPrescott Parma 05/31/2013, 8:27 AM Co-Sign MD

## 2013-05-31 NOTE — Progress Notes (Signed)
Primary cardiologist: Dr. Peter Sw[place]iland  Subjective:   No chest pain or breathlessness. Has ambulated in his room. No arm pain at access site.   Objective:   Temp:  [97.7 F (36.5 C)-98.1 F (36.7 C)] 97.8 F (36.6 C) (07/26 0736) Pulse Rate:  [59-75] 73 (07/26 0736) Resp:  [18] 18 (07/26 0736) BP: (126-189)/(55-88) 189/88 mmHg (07/26 0736) SpO2:  [95 %-99 %] 98 % (07/26 0736) Weight:  [216 lb 4.3 oz (98.1 kg)] 216 lb 4.3 oz (98.1 kg) (07/25 0922) Last BM Date: 05/29/13  Filed Weights   05/30/13 0109 05/30/13 0530 05/30/13 0922  Weight: 215 lb 2.7 oz (97.6 kg) 214 lb 11.7 oz (97.4 kg) 216 lb 4.3 oz (98.1 kg)    Intake/Output Summary (Last 24 hours) at 05/31/13 0801 Last data filed at 05/31/13 0736  Gross per 24 hour  Intake 1810.86 ml  Output    701 ml  Net 1109.86 ml   Telemetry: Sinus rhythm.  Exam:  General: Comfortable at rest.  Lungs: Clear, nonlabored.  Cardiac: RRR, no gallop.  Extremities: Right radial access site stable.  Lab Results:  Basic Metabolic Panel:  Recent Labs Lab 05/29/13 2300 05/30/13 0721 05/31/13 0415  NA 136 138 136  K 3.2* 3.6 3.8  CL 100 103 102  CO2 26 25 26   GLUCOSE 278* 148* 144*  BUN 9 9 9   CREATININE 0.78 0.80 0.85  CALCIUM 8.9 8.5 8.6  MG  --  2.2  --     Liver Function Tests:  Recent Labs Lab 05/31/13 0415  AST 17  ALT 23  ALKPHOS 63  BILITOT 0.4  PROT 5.9*  ALBUMIN 3.3*    CBC:  Recent Labs Lab 05/29/13 2300 05/30/13 0721 05/31/13 0415  WBC 8.0 7.8 8.9  HGB 14.8 14.0 13.5  HCT 40.8 38.0* 37.3*  MCV 89.1 89.2 90.3  PLT 149* 151 145*    Cardiac Enzymes:  Recent Labs Lab 05/29/13 2300 05/30/13 0206 05/30/13 0721  TROPONINI 0.54* 0.88* 1.34*    BNP:  Recent Labs  05/29/13 2300 05/30/13 0206  PROBNP 71.4 81.3    Coagulation:  Recent Labs Lab 05/30/13 0721  INR 1.02    ECG: Sinus rhythm with incomplete right bundle branch block. Nonspecific ST segment  changes.   Medications:   Scheduled Medications: . aspirin EC  81 mg Oral Daily  . atenolol  25 mg Oral Daily  . atorvastatin  80 mg Oral q1800  . insulin aspart  0-15 Units Subcutaneous TID WC  . prasugrel  10 mg Oral Daily  . sodium chloride  3 mL Intravenous Q12H      PRN Medications:  sodium chloride, acetaminophen, alum & mag hydroxide-simeth, morphine injection, ondansetron (ZOFRAN) IV, sodium chloride, traMADol   Assessment:   1. NSTEMI, peak troponin I 1.34.   2. CAD status post DES to LAD by Dr. Sw[place]iland on 7/25. Residual disease includes occluded ostial RCA with left to right collaterals, LVEF 55-65%.  3. Hypertension. Blood pressure up this morning. Was on triamterene hydrochlorothiazide at home. Hypokalemic at presentation was very low potassium supplement.  4. Hyperlipidemia. Triglycerides 544 with HDL 23, LDL not calculated. Has been placed on high-dose Lipitor.  5. Tobacco abuse.  Plan/Discussion:    Patient ready for discharge today. He will need to followup with Dr. Sw[place]and, office visit in approximately 2 weeks. DAPT including Prasugrel discussed with patient. Would resume triamterene hydrochlorothiazide and consider potassium 10 mEq for supplementation daily. He agrees to take Lipitor  for now, but states he had reservations about this in the past. He may even need the addition of an omega-3 supplement which can be addressed at his office followup. Diet and smoking cessation discussed. He will also be bringing FMLA papers to the office for review.   Jonelle Sidle, M.D., F.A.C.C.

## 2013-05-31 NOTE — Discharge Summary (Signed)
Please see my rounding note as well. 

## 2013-05-31 NOTE — Progress Notes (Signed)
CARDIAC REHAB PHASE I   PRE:  Rate/Rhythm: 67 sinus rhythm   BP:  Supine:   Sitting: 166/84  Standing:    SaO2: 97% ra  MODE:  Ambulation:  600 ft   POST:  Rate/Rhythem: 72 sinus rhythm  BP:  Supine:   Sitting: 183/96 RN aware  Standing:    SaO2: 99% ra  810-920 Pt ambulated in hallway without difficulty. Steady gait.  Pt c/o chronic knee and calf discomfort, same as his usual. He reports he has this pain at rest and ambulation.  Pt education completed. Smoking cessation included although pt is somewhat hesitant to commitment to stop smoking.  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  Health benefits of smoking cessation reviewed.  Diabetes education provided and pt instructed to f/u with PCP after discharge to discuss further.  Pt oriented to CRPII.  At pt request referral will be sent to [facility] outpatient CR.   Todd [name], [place]

## 2013-06-02 ENCOUNTER — Telehealth: Payer: Self-pay | Admitting: Cardiology

## 2013-06-02 ENCOUNTER — Telehealth: Payer: Self-pay | Admitting: Family Medicine

## 2013-06-02 ENCOUNTER — Encounter: Payer: Self-pay | Admitting: Family Medicine

## 2013-06-02 MED ORDER — TRIAMTERENE-HCTZ 50-25 MG PO CAPS: 1.0000 | ORAL_CAPSULE | Freq: Every day | ORAL | Status: DC

## 2013-06-02 MED ORDER — ATENOLOL 25 MG PO TABS: 50.0000 mg | ORAL_TABLET | Freq: Every day | ORAL | Status: DC

## 2013-06-02 NOTE — Progress Notes (Signed)
   CARE MANAGEMENT NOTE 06/02/2013  Patient:  Todd [name], Todd [name]   Account Number:  [id number]  Date Initiated:  06/02/2013  Documentation initiated by:  [facility]  Subjective/Objective Assessment:     Action/Plan:   Anticipated DC Date:  06/02/2013   Anticipated DC Plan:  HOME/SELF CARE      DC Planning Services  CM consult  Medication Assistance      Choice offered to / List presented to:             Status of service:  Completed, signed off Medicare Important Message given?   (If response is "NO", the following Medicare IM given date fields will be blank) Date Medicare IM given:   Date Additional Medicare IM given:    Discharge Disposition:  HOME/SELF CARE  Per UR Regulation:    If discussed at Long Length of Stay Meetings, dates discussed:    Comments:  06/02/2013  1130 NCM spoke to pt's wife, she had questions regarding dose for ASA. NCM gave her informaiton from pt's dc instructions regarding medication. Provided pt's wife with contact number for Effient to sign up to receive 30 day free trial and copay card. Explained the Eff[employer] support can retroactivate 30 day free copay card due to dc over the weekend. States they paid $42 for medication. She will give NCM a call back if she runs into any complication with getting trial card or copay card at [facility]. Isidoro Donning RN CCM Case Mgmt phone [phone]

## 2013-06-02 NOTE — Telephone Encounter (Signed)
New Prob    1. Pt states he needs a doctors note for his employer to be out of work as soon as possible 2. Pt states he was told to follow up with Dr. Swaziland in 2 weeks, however, Dr. Swaziland is booked out until October. Offered PA/NP for f/u pt declined.   Please call

## 2013-06-02 NOTE — Telephone Encounter (Signed)
Returned call to patient post hospital appointment scheduled with Norma Fredrickson NP 06/24/13.Patient stated he needed a note saying he had a MI on 05/29/13 and post hospital appointment 06/24/13, will be given a return to work note after appointment on 06/24/13.Note left at 3rd floor front desk.Patient also stated he will be bringing FMLA papers to be completed tomorrow 06/03/13 when he picks up note.

## 2013-06-02 NOTE — Telephone Encounter (Signed)
..  Rx Refilled and letter sent to schedule ov

## 2013-06-04 ENCOUNTER — Other Ambulatory Visit: Payer: Managed Care, Other (non HMO)

## 2013-06-04 NOTE — Discharge Summary (Signed)
See also my rounding note. 

## 2013-06-06 ENCOUNTER — Encounter: Payer: Self-pay | Admitting: Family Medicine

## 2013-06-06 ENCOUNTER — Ambulatory Visit (INDEPENDENT_AMBULATORY_CARE_PROVIDER_SITE_OTHER): Payer: Managed Care, Other (non HMO) | Admitting: Family Medicine

## 2013-06-06 VITALS — BP 130/70 | HR 82 | Temp 98.7°F | Resp 18 | Ht 67.0 in | Wt 212.0 lb

## 2013-06-06 DIAGNOSIS — Z23 Encounter for immunization: Secondary | ICD-10-CM

## 2013-06-06 DIAGNOSIS — I2510 Atherosclerotic heart disease of native coronary artery without angina pectoris: Secondary | ICD-10-CM | POA: Insufficient documentation

## 2013-06-06 MED ORDER — LOSARTAN POTASSIUM 50 MG PO TABS: 50.0000 mg | ORAL_TABLET | Freq: Every day | ORAL | Status: DC
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
## 2013-06-06 NOTE — Patient Instructions (Addendum)
Stop triamterene.   2 days later, stop klor con.  Start Losartan.  Recheck blood in 1 week.

## 2013-06-16 ENCOUNTER — Telehealth: Payer: Self-pay | Admitting: Family Medicine

## 2013-06-16 ENCOUNTER — Other Ambulatory Visit: Payer: Managed Care, Other (non HMO)

## 2013-06-16 DIAGNOSIS — E119 Type 2 diabetes mellitus without complications: Secondary | ICD-10-CM

## 2013-06-16 DIAGNOSIS — Z0000 Encounter for general adult medical examination without abnormal findings: Secondary | ICD-10-CM

## 2013-06-16 DIAGNOSIS — Z23 Encounter for immunization: Secondary | ICD-10-CM

## 2013-06-16 LAB — BASIC METABOLIC PANEL WITH GFR
CO2: 27 mEq/L (ref 19–32)
GFR, Est African American: >89 mL/min
Glucose, Bld: 118 mg/dL — ABNORMAL HIGH (ref 70–99)
Potassium: 3.9 mEq/L (ref 3.5–5.3)
Sodium: 136 mEq/L (ref 135–145)

## 2013-06-16 MED ORDER — PRASUGREL HCL 10 MG PO TABS: 10.0000 mg | ORAL_TABLET | Freq: Every day | ORAL | Status: DC

## 2013-06-16 MED ORDER — POTASSIUM CHLORIDE ER 10 MEQ PO TBCR: 20.0000 meq | EXTENDED_RELEASE_TABLET | Freq: Two times a day (BID) | ORAL | Status: DC

## 2013-06-16 MED ORDER — ATORVASTATIN CALCIUM 80 MG PO TABS: 80.0000 mg | ORAL_TABLET | Freq: Every day | ORAL | Status: DC

## 2013-06-16 MED ORDER — TRIAMTERENE-HCTZ 50-25 MG PO CAPS: 1.0000 | ORAL_CAPSULE | Freq: Every day | ORAL | Status: DC

## 2013-06-16 MED ORDER — ATENOLOL 50 MG PO TABS: 50.0000 mg | ORAL_TABLET | Freq: Every day | ORAL | Status: DC

## 2013-06-16 NOTE — Telephone Encounter (Signed)
Rx Refilled  

## 2013-06-24 ENCOUNTER — Encounter: Payer: Self-pay | Admitting: Nurse Practitioner

## 2013-06-24 ENCOUNTER — Ambulatory Visit (INDEPENDENT_AMBULATORY_CARE_PROVIDER_SITE_OTHER): Payer: Managed Care, Other (non HMO) | Admitting: Nurse Practitioner

## 2013-06-24 VITALS — BP 130/80 | HR 68 | Ht 67.0 in | Wt 217.2 lb

## 2013-06-24 DIAGNOSIS — I214 Non-ST elevation (NSTEMI) myocardial infarction: Secondary | ICD-10-CM

## 2013-06-24 LAB — CBC WITH DIFFERENTIAL/PLATELET
Basophils Absolute: 0.1 10*3/uL (ref 0.0–0.1)
Basophils Relative: 0.7 % (ref 0.0–3.0)
Eosinophils Absolute: 0.2 10*3/uL (ref 0.0–0.7)
Eosinophils Relative: 2.7 % (ref 0.0–5.0)
HCT: 43.3 % (ref 39.0–52.0)
Hemoglobin: 15 g/dL (ref 13.0–17.0)
Lymphocytes Relative: 25.8 % (ref 12.0–46.0)
Lymphs Abs: 2.3 10*3/uL (ref 0.7–4.0)
MCHC: 34.7 g/dL (ref 30.0–36.0)
MCV: 92.4 fl (ref 78.0–100.0)
Monocytes Absolute: 0.7 10*3/uL (ref 0.1–1.0)
Monocytes Relative: 7.9 % (ref 3.0–12.0)
Neutro Abs: 5.6 10*3/uL (ref 1.4–7.7)
Neutrophils Relative %: 62.9 % (ref 43.0–77.0)
Platelets: 172 10*3/uL (ref 150.0–400.0)
RBC: 4.69 Mil/uL (ref 4.22–5.81)
RDW: 13.2 % (ref 11.5–14.6)
WBC: 8.9 10*3/uL (ref 4.5–10.5)

## 2013-06-24 LAB — BASIC METABOLIC PANEL
BUN: 9 mg/dL (ref 6–23)
CO2: 26 mEq/L (ref 19–32)
Calcium: 8.6 mg/dL (ref 8.4–10.5)
Chloride: 103 mEq/L (ref 96–112)
Creatinine, Ser: 0.8 mg/dL (ref 0.4–1.5)
GFR: 110.04 mL/min (ref >60.00)
Glucose, Bld: 101 mg/dL — ABNORMAL HIGH (ref 70–99)
Potassium: 3.7 mEq/L (ref 3.5–5.1)
Sodium: 135 mEq/L (ref 135–145)

## 2013-06-24 NOTE — Patient Instructions (Addendum)
We need to check follow up labs today  Cold products to use are Coricidin HBP/plain antihistamines  Keep working on stopping your smoking  See Dr. Swaziland in 8 weeks with fasting labs  Ok to return to work next week  Call the [facility] office at [phone] if you have any questions, problems or concerns.
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
## 2013-08-20 ENCOUNTER — Encounter: Payer: Self-pay | Admitting: Cardiology

## 2013-08-20 ENCOUNTER — Ambulatory Visit (INDEPENDENT_AMBULATORY_CARE_PROVIDER_SITE_OTHER): Payer: Managed Care, Other (non HMO) | Admitting: Cardiology

## 2013-08-20 VITALS — BP 122/70 | HR 52 | Ht 67.5 in | Wt 211.0 lb

## 2013-08-20 DIAGNOSIS — I214 Non-ST elevation (NSTEMI) myocardial infarction: Secondary | ICD-10-CM

## 2013-08-20 DIAGNOSIS — Z720 Tobacco use: Secondary | ICD-10-CM

## 2013-08-20 DIAGNOSIS — F17200 Nicotine dependence, unspecified, uncomplicated: Secondary | ICD-10-CM

## 2013-08-20 DIAGNOSIS — E785 Hyperlipidemia, unspecified: Secondary | ICD-10-CM

## 2013-08-20 DIAGNOSIS — I10 Essential (primary) hypertension: Secondary | ICD-10-CM

## 2013-08-20 DIAGNOSIS — I2510 Atherosclerotic heart disease of native coronary artery without angina pectoris: Secondary | ICD-10-CM

## 2013-08-20 LAB — LIPID PANEL
Cholesterol: 112 mg/dL (ref 0–200)
HDL: 33.4 mg/dL — ABNORMAL LOW (ref >39.00)
LDL Cholesterol: 57 mg/dL (ref 0–99)
VLDL: 21.6 mg/dL (ref 0.0–40.0)

## 2013-08-20 LAB — HEPATIC FUNCTION PANEL
ALT: 30 U/L (ref 0–53)
Bilirubin, Direct: 0 mg/dL (ref 0.0–0.3)
Total Bilirubin: 0.4 mg/dL (ref 0.3–1.2)
Total Protein: 7.1 g/dL (ref 6.0–8.3)
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
## 2013-08-20 NOTE — Patient Instructions (Signed)
Stop smoking!  We will check your cholesterol today.  Continue your exercise program and diet.  I will see you in 6 months.

## 2013-09-11 ENCOUNTER — Other Ambulatory Visit: Payer: Self-pay

## 2013-09-30 ENCOUNTER — Telehealth: Payer: Self-pay | Admitting: Cardiology

## 2013-09-30 NOTE — Telephone Encounter (Signed)
Received request from Nurse fax box, documents faxed for surgical clearance. To: [facility] Fax number: [phone] Attention: 09/30/13/km

## 2013-11-21 ENCOUNTER — Other Ambulatory Visit: Payer: Self-pay | Admitting: Family Medicine

## 2013-11-25 ENCOUNTER — Telehealth: Payer: Self-pay | Admitting: Family Medicine

## 2013-11-25 MED ORDER — EPINEPHRINE 0.3 MG/0.3ML IJ SOAJ: 0.3000 mg | Freq: Once | INTRAMUSCULAR | Status: DC

## 2013-11-25 NOTE — Telephone Encounter (Signed)
Pt is calling today to see if he can get a refill on his epi pen  Pharmacy is [facility] in [place] Call back number is [phone]

## 2013-11-25 NOTE — Telephone Encounter (Signed)
Sent rx to pharm for epipen

## 2013-12-09 ENCOUNTER — Other Ambulatory Visit: Payer: Self-pay | Admitting: Family Medicine

## 2014-01-21 ENCOUNTER — Other Ambulatory Visit: Payer: Self-pay | Admitting: Family Medicine

## 2014-02-20 ENCOUNTER — Other Ambulatory Visit: Payer: Self-pay | Admitting: Family Medicine

## 2014-02-20 NOTE — Telephone Encounter (Signed)
Medication refilled per protocol. 

## 2014-03-06 ENCOUNTER — Ambulatory Visit (INDEPENDENT_AMBULATORY_CARE_PROVIDER_SITE_OTHER): Payer: Managed Care, Other (non HMO) | Admitting: Cardiology

## 2014-03-06 ENCOUNTER — Encounter: Payer: Self-pay | Admitting: Cardiology

## 2014-03-06 VITALS — BP 110/80 | HR 58 | Ht 67.5 in | Wt 206.1 lb

## 2014-03-06 DIAGNOSIS — F17200 Nicotine dependence, unspecified, uncomplicated: Secondary | ICD-10-CM

## 2014-03-06 DIAGNOSIS — I2510 Atherosclerotic heart disease of native coronary artery without angina pectoris: Secondary | ICD-10-CM

## 2014-03-06 DIAGNOSIS — E119 Type 2 diabetes mellitus without complications: Secondary | ICD-10-CM

## 2014-03-06 DIAGNOSIS — I10 Essential (primary) hypertension: Secondary | ICD-10-CM

## 2014-03-06 DIAGNOSIS — Z720 Tobacco use: Secondary | ICD-10-CM

## 2014-03-06 DIAGNOSIS — E785 Hyperlipidemia, unspecified: Secondary | ICD-10-CM

## 2014-03-06 MED ORDER — ATORVASTATIN CALCIUM 80 MG PO TABS: 80.0000 mg | ORAL_TABLET | Freq: Every day | ORAL | Status: DC

## 2014-03-06 MED ORDER — LOSARTAN POTASSIUM 50 MG PO TABS: 50.0000 mg | ORAL_TABLET | Freq: Every day | ORAL | Status: DC

## 2014-03-06 MED ORDER — ATENOLOL 50 MG PO TABS: 50.0000 mg | ORAL_TABLET | Freq: Every day | ORAL | Status: DC

## 2014-03-06 NOTE — Patient Instructions (Signed)
You may stop Effient on May 30, 2014. After that you may proceed with dental and orthopedic surgery.  I will see you in 6 months with fasting lab work  You need to stop smoking.
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
## 2014-04-22 ENCOUNTER — Other Ambulatory Visit: Payer: Self-pay | Admitting: Surgical

## 2014-04-24 ENCOUNTER — Other Ambulatory Visit: Payer: Self-pay | Admitting: Family Medicine

## 2014-05-27 ENCOUNTER — Encounter (HOSPITAL_COMMUNITY): Payer: Self-pay | Admitting: Pharmacy Technician

## 2014-05-29 ENCOUNTER — Other Ambulatory Visit: Payer: Self-pay | Admitting: Family Medicine

## 2014-05-29 NOTE — Telephone Encounter (Signed)
Medication refilled per protocol. 

## 2014-06-02 NOTE — Patient Instructions (Signed)
Todd [name]  06/02/2014   Your procedure is scheduled on:  06/09/2014  1230pm-130pm  Report to [facility].  Follow the Signs to [place] at    1030      am  Call this number if you have problems the morning of surgery: [phone]   Remember:   Do not eat food or drink liquids after midnight.   Take these medicines the morning of surgery with A SIP OF WATER:    Do not wear jewelry,   Do not wear lotions, powders, or perfumes. , deodorant .    Marland Kitchen Men may shave face and neck.  Do not bring valuables to the hospital.  Contacts, dentures or bridgework may not be worn into surgery.       Patients discharged the day of surgery will not be allowed to drive  home.  Name and phone number of your driver:       Please read over the following fact sheets that you were given:[facility] - Preparing for Surgery Before surgery, you can play an important role.  Because skin is not sterile, your skin needs to be as free of germs as possible.  You can reduce the number of germs on your skin by washing with CHG (chlorahexidine gluconate) soap before surgery.  CHG is an antiseptic cleaner which kills germs and bonds with the skin to continue killing germs even after washing. Please DO NOT use if you have an allergy to CHG or antibacterial soaps.  If your skin becomes reddened/irritated stop using the CHG and inform your nurse when you arrive at Short Stay. Do not shave (including legs and underarms) for at least 48 hours prior to the first CHG shower.  You may shave your face/neck. Please follow these instructions carefully:  1.  Shower with CHG Soap the night before surgery and the  morning of Surgery.  2.  If you choose to wash your hair, wash your hair first as usual with your  normal  shampoo.  3.  After you shampoo, rinse your hair and body thoroughly to remove the  shampoo.                           4.  Use CHG as you would any other liquid soap.  You can apply chg directly  to  the skin and wash                       Gently with a scrungie or clean washcloth.  5.  Apply the CHG Soap to your body ONLY FROM THE NECK DOWN.   Do not use on face/ open                           Wound or open sores. Avoid contact with eyes, ears mouth and genitals (private parts).                       Wash face,  Genitals (private parts) with your normal soap.             6.  Wash thoroughly, paying special attention to the area where your surgery  will be performed.  7.  Thoroughly rinse your body with warm water from the neck down.  8.  DO NOT shower/wash with your normal soap after using and rinsing off  the  CHG Soap.                9.  Pat yourself dry with a clean towel.            10.  Wear clean pajamas.            11.  Place clean sheets on your bed the night of your first shower and do not  sleep with pets. Day of Surgery : Do not apply any lotions/deodorants the morning of surgery.  Please wear clean clothes to the hospital/surgery center.  FAILURE TO FOLLOW THESE INSTRUCTIONS MAY RESULT IN THE CANCELLATION OF YOUR SURGERY PATIENT SIGNATURE_________________________________  NURSE SIGNATURE__________________________________  ________________________________________________________________________ , coughing and deep breathing exercises, leg exercises

## 2014-06-03 ENCOUNTER — Encounter (HOSPITAL_COMMUNITY)
Admission: RE | Admit: 2014-06-03 | Discharge: 2014-06-03 | Disposition: A | Discharge status: Home / Self Care | Payer: Worker's Compensation | Source: Ambulatory Visit | Attending: Orthopedic Surgery | Admitting: Orthopedic Surgery

## 2014-06-03 ENCOUNTER — Ambulatory Visit (HOSPITAL_COMMUNITY)
Admission: RE | Admit: 2014-06-03 | Discharge: 2014-06-03 | Disposition: A | Discharge status: Home / Self Care | Payer: Managed Care, Other (non HMO) | Source: Ambulatory Visit | Attending: Orthopedic Surgery | Admitting: Orthopedic Surgery

## 2014-06-03 ENCOUNTER — Encounter (HOSPITAL_COMMUNITY): Payer: Self-pay

## 2014-06-03 DIAGNOSIS — Z01818 Encounter for other preprocedural examination: Secondary | ICD-10-CM | POA: Diagnosis not present

## 2014-06-03 DIAGNOSIS — Z01812 Encounter for preprocedural laboratory examination: Secondary | ICD-10-CM | POA: Insufficient documentation

## 2014-06-03 HISTORY — DX: Unspecified osteoarthritis, unspecified site: M19.90

## 2014-06-03 HISTORY — DX: Adverse effect of unspecified anesthetic, initial encounter: T41.45XA

## 2014-06-03 HISTORY — DX: Other complications of anesthesia, initial encounter: T88.59XA

## 2014-06-03 LAB — BASIC METABOLIC PANEL
Anion gap: 11 (ref 5–15)
BUN: 11 mg/dL (ref 6–23)
CALCIUM: 9.1 mg/dL (ref 8.4–10.5)
CO2: 27 mEq/L (ref 19–32)
Chloride: 101 mEq/L (ref 96–112)
Creatinine, Ser: 0.84 mg/dL (ref 0.50–1.35)
GFR calc Af Amer: >90 mL/min (ref >90)
GFR calc non Af Amer: >90 mL/min (ref >90)
GLUCOSE: 117 mg/dL — AB (ref 70–99)
POTASSIUM: 4 meq/L (ref 3.7–5.3)
SODIUM: 139 meq/L (ref 137–147)

## 2014-06-03 LAB — CBC
HEMATOCRIT: 44.8 % (ref 39.0–52.0)
HEMOGLOBIN: 15.6 g/dL (ref 13.0–17.0)
MCH: 31.5 pg (ref 26.0–34.0)
MCHC: 34.8 g/dL (ref 30.0–36.0)
MCV: 90.5 fL (ref 78.0–100.0)
Platelets: 150 10*3/uL (ref 150–400)
RBC: 4.95 MIL/uL (ref 4.22–5.81)
RDW: 12.8 % (ref 11.5–15.5)
WBC: 10 10*3/uL (ref 4.0–10.5)

## 2014-06-03 NOTE — Progress Notes (Signed)
CAth 05/30/13 EPIC  03/06/2014 LOV with DR Martinique in EPIC  Clearance on chart

## 2014-06-03 NOTE — Progress Notes (Signed)
EKG 03/06/14 EPIC  CXR 06/03/14 EPIC
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
## 2014-06-09 ENCOUNTER — Ambulatory Visit (HOSPITAL_COMMUNITY)
Admission: RE | Admit: 2014-06-09 | Discharge: 2014-06-09 | Disposition: A | Discharge status: Home / Self Care | Payer: Worker's Compensation | Source: Ambulatory Visit | Attending: Orthopedic Surgery | Admitting: Orthopedic Surgery

## 2014-06-09 ENCOUNTER — Encounter (HOSPITAL_COMMUNITY): Payer: Self-pay | Admitting: *Deleted

## 2014-06-09 ENCOUNTER — Encounter (HOSPITAL_COMMUNITY)
Admission: RE | Disposition: A | Discharge status: Home / Self Care | Payer: Managed Care, Other (non HMO) | Source: Ambulatory Visit | Attending: Orthopedic Surgery

## 2014-06-09 ENCOUNTER — Ambulatory Visit (HOSPITAL_COMMUNITY): Payer: Worker's Compensation | Admitting: Anesthesiology

## 2014-06-09 ENCOUNTER — Encounter (HOSPITAL_COMMUNITY): Payer: Worker's Compensation | Admitting: Anesthesiology

## 2014-06-09 DIAGNOSIS — E785 Hyperlipidemia, unspecified: Secondary | ICD-10-CM | POA: Insufficient documentation

## 2014-06-09 DIAGNOSIS — I252 Old myocardial infarction: Secondary | ICD-10-CM | POA: Insufficient documentation

## 2014-06-09 DIAGNOSIS — I2510 Atherosclerotic heart disease of native coronary artery without angina pectoris: Secondary | ICD-10-CM | POA: Insufficient documentation

## 2014-06-09 DIAGNOSIS — S83249A Other tear of medial meniscus, current injury, unspecified knee, initial encounter: Secondary | ICD-10-CM

## 2014-06-09 DIAGNOSIS — I10 Essential (primary) hypertension: Secondary | ICD-10-CM | POA: Insufficient documentation

## 2014-06-09 DIAGNOSIS — E119 Type 2 diabetes mellitus without complications: Secondary | ICD-10-CM | POA: Insufficient documentation

## 2014-06-09 DIAGNOSIS — Z886 Allergy status to analgesic agent status: Secondary | ICD-10-CM | POA: Insufficient documentation

## 2014-06-09 DIAGNOSIS — X58XXXA Exposure to other specified factors, initial encounter: Secondary | ICD-10-CM | POA: Insufficient documentation

## 2014-06-09 DIAGNOSIS — IMO0002 Reserved for concepts with insufficient information to code with codable children: Secondary | ICD-10-CM | POA: Insufficient documentation

## 2014-06-09 DIAGNOSIS — M6580 Other synovitis and tenosynovitis, unspecified site: Secondary | ICD-10-CM | POA: Insufficient documentation

## 2014-06-09 DIAGNOSIS — F17200 Nicotine dependence, unspecified, uncomplicated: Secondary | ICD-10-CM | POA: Insufficient documentation

## 2014-06-09 DIAGNOSIS — M1710 Unilateral primary osteoarthritis, unspecified knee: Secondary | ICD-10-CM | POA: Insufficient documentation

## 2014-06-09 DIAGNOSIS — Z85828 Personal history of other malignant neoplasm of skin: Secondary | ICD-10-CM | POA: Insufficient documentation

## 2014-06-09 DIAGNOSIS — Z880 Allergy status to penicillin: Secondary | ICD-10-CM | POA: Insufficient documentation

## 2014-06-09 DIAGNOSIS — S83242A Other tear of medial meniscus, current injury, left knee, initial encounter: Secondary | ICD-10-CM

## 2014-06-09 HISTORY — PX: KNEE ARTHROSCOPY WITH MEDIAL MENISECTOMY: SHX5651

## 2014-06-09 SURGERY — ARTHROSCOPY, KNEE, WITH MEDIAL MENISCECTOMY
Anesthesia: General | Site: Knee | Laterality: Left

## 2014-06-09 MED ORDER — ASPIRIN 81 MG PO CHEW
81.0000 mg | CHEWABLE_TABLET | Freq: Once | ORAL | Status: AC
  Administered 2014-06-09: 81 mg via ORAL
  Filled 2014-06-09: qty 1

## 2014-06-09 MED ORDER — LACTATED RINGERS IR SOLN
Status: DC | PRN
  Administered 2014-06-09: 6000 mL

## 2014-06-09 MED ORDER — DEXAMETHASONE SODIUM PHOSPHATE 10 MG/ML IJ SOLN
INTRAMUSCULAR | Status: DC | PRN
  Administered 2014-06-09: 10 mg via INTRAVENOUS

## 2014-06-09 MED ORDER — LACTATED RINGERS IV SOLN
INTRAVENOUS | Status: DC
  Administered 2014-06-09 (×2): 1000 mL via INTRAVENOUS

## 2014-06-09 MED ORDER — FENTANYL CITRATE 0.05 MG/ML IJ SOLN
INTRAMUSCULAR | Status: DC | PRN
  Administered 2014-06-09 (×4): 25 ug via INTRAVENOUS

## 2014-06-09 MED ORDER — OXYCODONE-ACETAMINOPHEN 10-325 MG PO TABS: 1.0000 | ORAL_TABLET | ORAL | Status: DC | PRN

## 2014-06-09 MED ORDER — FENTANYL CITRATE 0.05 MG/ML IJ SOLN
INTRAMUSCULAR | Status: AC
  Filled 2014-06-09: qty 2

## 2014-06-09 MED ORDER — CLINDAMYCIN PHOSPHATE 900 MG/50ML IV SOLN
INTRAVENOUS | Status: AC
  Filled 2014-06-09: qty 50

## 2014-06-09 MED ORDER — CLINDAMYCIN PHOSPHATE 900 MG/50ML IV SOLN
900.0000 mg | INTRAVENOUS | Status: AC
  Administered 2014-06-09: 900 mg via INTRAVENOUS

## 2014-06-09 MED ORDER — FENTANYL CITRATE 0.05 MG/ML IJ SOLN
25.0000 ug | INTRAMUSCULAR | Status: DC | PRN
  Administered 2014-06-09: 50 ug via INTRAVENOUS

## 2014-06-09 MED ORDER — BUPIVACAINE-EPINEPHRINE (PF) 0.25% -1:200000 IJ SOLN
INTRAMUSCULAR | Status: AC
  Filled 2014-06-09: qty 30

## 2014-06-09 MED ORDER — PROMETHAZINE HCL 25 MG/ML IJ SOLN
6.2500 mg | INTRAMUSCULAR | Status: DC | PRN
Start: 2014-06-09 — End: 2014-06-09

## 2014-06-09 MED ORDER — BUPIVACAINE-EPINEPHRINE 0.25% -1:200000 IJ SOLN
INTRAMUSCULAR | Status: DC | PRN
  Administered 2014-06-09: 30 mL

## 2014-06-09 MED ORDER — BACITRACIN ZINC 500 UNIT/GM EX OINT
TOPICAL_OINTMENT | CUTANEOUS | Status: DC | PRN
  Administered 2014-06-09: 1 via TOPICAL

## 2014-06-09 MED ORDER — LIDOCAINE HCL (CARDIAC) 20 MG/ML IV SOLN
INTRAVENOUS | Status: AC
  Filled 2014-06-09: qty 5

## 2014-06-09 MED ORDER — MIDAZOLAM HCL 2 MG/2ML IJ SOLN
INTRAMUSCULAR | Status: AC
  Filled 2014-06-09: qty 2

## 2014-06-09 MED ORDER — DEXAMETHASONE SODIUM PHOSPHATE 10 MG/ML IJ SOLN
INTRAMUSCULAR | Status: AC
  Filled 2014-06-09: qty 1

## 2014-06-09 MED ORDER — PROPOFOL 10 MG/ML IV BOLUS
INTRAVENOUS | Status: AC
  Filled 2014-06-09: qty 20

## 2014-06-09 MED ORDER — ONDANSETRON HCL 4 MG/2ML IJ SOLN
INTRAMUSCULAR | Status: AC
  Filled 2014-06-09: qty 2

## 2014-06-09 MED ORDER — LACTATED RINGERS IV SOLN
INTRAVENOUS | Status: DC
Start: 2014-06-09 — End: 2014-06-09

## 2014-06-09 MED ORDER — BACITRACIN ZINC 500 UNIT/GM EX OINT
TOPICAL_OINTMENT | CUTANEOUS | Status: AC
  Filled 2014-06-09: qty 28.35

## 2014-06-09 SURGICAL SUPPLY — 26 items
BANDAGE ELASTIC 4 VELCRO ST LF (GAUZE/BANDAGES/DRESSINGS) ×2 IMPLANT
BANDAGE ELASTIC 6 VELCRO ST LF (GAUZE/BANDAGES/DRESSINGS) ×2 IMPLANT
BLADE GREAT WHITE 4.2 (BLADE) IMPLANT
BNDG COHESIVE 6X5 TAN STRL LF (GAUZE/BANDAGES/DRESSINGS) ×2 IMPLANT
CLOTH BEACON ORANGE TIMEOUT ST (SAFETY) ×2 IMPLANT
COUNTER NEEDLE 20 DBL MAG RED (NEEDLE) ×2 IMPLANT
DRAPE LG THREE QUARTER DISP (DRAPES) ×2 IMPLANT
DRSG ADAPTIC 3X8 NADH LF (GAUZE/BANDAGES/DRESSINGS) ×2 IMPLANT
DRSG PAD ABDOMINAL 8X10 ST (GAUZE/BANDAGES/DRESSINGS) ×6 IMPLANT
DURAPREP 26ML APPLICATOR (WOUND CARE) ×2 IMPLANT
GLOVE BIOGEL PI IND STRL 8 (GLOVE) ×1 IMPLANT
GLOVE BIOGEL PI INDICATOR 8 (GLOVE) ×1
GLOVE ECLIPSE 8.0 STRL XLNG CF (GLOVE) ×2 IMPLANT
GOWN STRL REUS W/TWL XL LVL3 (GOWN DISPOSABLE) ×2 IMPLANT
MANIFOLD NEPTUNE II (INSTRUMENTS) ×2 IMPLANT
PACK ARTHROSCOPY WL (CUSTOM PROCEDURE TRAY) ×2 IMPLANT
PACK ICE MAXI GEL EZY WRAP (MISCELLANEOUS) ×2 IMPLANT
PAD MASON LEG HOLDER (PIN) ×2 IMPLANT
PADDING CAST COTTON 6X4 STRL (CAST SUPPLIES) ×2 IMPLANT
SET ARTHROSCOPY TUBING (MISCELLANEOUS) ×1
SET ARTHROSCOPY TUBING LN (MISCELLANEOUS) ×1 IMPLANT
SUT ETHILON 3 0 PS 1 (SUTURE) ×2 IMPLANT
TOWEL OR 17X26 10 PK STRL BLUE (TOWEL DISPOSABLE) ×2 IMPLANT
TUBING CONNECTING 10 (TUBING) IMPLANT
WAND 90 DEG TURBOVAC W/CORD (SURGICAL WAND) ×2 IMPLANT
WRAP KNEE MAXI GEL POST OP (GAUZE/BANDAGES/DRESSINGS) ×2 IMPLANT

## 2014-06-09 NOTE — Anesthesia Postprocedure Evaluation (Signed)
  Anesthesia Post-op Note  Patient: Todd [name]  Procedure(s) Performed: Procedure(s): LEFT KNEE ARTHROSCOPY WITH PARTIAL MEDIAL MENISECTOMY, SYNOVECTOMY SUPRAPATELLA (Left)  Patient Location: PACU  Anesthesia Type:General  Level of Consciousness: awake, alert  and oriented  Airway and Oxygen Therapy: Patient Spontanous Breathing  Post-op Pain: none  Post-op Assessment: Post-op Vital signs reviewed  Post-op Vital Signs: Reviewed  Last Vitals:  Filed Vitals:   06/09/14 1515  BP: 102/60  Pulse: 55  Temp: 36.3 C  Resp: 16    Complications: No apparent anesthesia complications

## 2014-06-09 NOTE — Transfer of Care (Signed)
Immediate Anesthesia Transfer of Care Note  Patient: Todd [name]  Procedure(s) Performed: Procedure(s) (LRB): LEFT KNEE ARTHROSCOPY WITH PARTIAL MEDIAL MENISECTOMY, SYNOVECTOMY SUPRAPATELLA (Left)  Patient Location: PACU  Anesthesia Type: General  Level of Consciousness: sedated, patient cooperative and responds to stimulation  Airway & Oxygen Therapy: Patient Spontanous Breathing and Patient connected to face mask oxgen  Post-op Assessment: Report given to PACU RN and Post -op Vital signs reviewed and stable  Post vital signs: Reviewed and stable  Complications: No apparent anesthesia complications

## 2014-06-09 NOTE — Progress Notes (Signed)
Patient complained of chest pain upon arrival. Notified anesthesia. Dr. Ola Spurr evaluated patient in Short Stay. No additional orders received.

## 2014-06-09 NOTE — Interval H&P Note (Signed)
History and Physical Interval Note:  06/09/2014 12:29 PM  Todd [name]  has presented today for surgery, with the diagnosis of left knee medial mensical tear  The various methods of treatment have been discussed with the patient and family. After consideration of risks, benefits and other options for treatment, the patient has consented to  Procedure(s): LEFT KNEE ARTHROSCOPY WITH MEDIAL MENISECTOMY (Left) as a surgical intervention .  The patient's history has been reviewed, patient examined, no change in status, stable for surgery.  I have reviewed the patient's chart and labs.  Questions were answered to the patient's satisfaction.     Luz Mares A

## 2014-06-09 NOTE — Anesthesia Preprocedure Evaluation (Addendum)
Anesthesia Evaluation  Patient identified by MRN, date of birth, ID band Patient awake    Reviewed: Allergy & Precautions, H&P , NPO status , Patient's Chart, lab work & pertinent test results, reviewed documented beta blocker date and time   History of Anesthesia Complications (+) PROLONGED EMERGENCE and history of anesthetic complications  Airway Mallampati: III TM Distance: >3 FB Neck ROM: Full    Dental  (+) Dental Advisory Given, Poor Dentition   Pulmonary Current Smoker,  breath sounds clear to auscultation        Cardiovascular hypertension, + CAD, + Past MI and + Cardiac Stents Rhythm:Regular Rate:Normal     Neuro/Psych negative neurological ROS  negative psych ROS   GI/Hepatic negative GI ROS, Neg liver ROS,   Endo/Other  diabetes, Type 2  Renal/GU   negative genitourinary   Musculoskeletal negative musculoskeletal ROS (+)   Abdominal   Peds  Hematology negative hematology ROS (+)   Anesthesia Other Findings   Reproductive/Obstetrics negative OB ROS                          Anesthesia Physical Anesthesia Plan  ASA: III  Anesthesia Plan: General   Post-op Pain Management:    Induction:   Airway Management Planned: LMA  Additional Equipment: None  Intra-op Plan:   Post-operative Plan: Extubation in OR  Informed Consent: I have reviewed the patients History and Physical, chart, labs and discussed the procedure including the risks, benefits and alternatives for the proposed anesthesia with the patient or authorized representative who has indicated his/her understanding and acceptance.   Dental advisory given  Plan Discussed with: CRNA and Surgeon  Anesthesia Plan Comments:        Anesthesia Quick Evaluation

## 2014-06-09 NOTE — Brief Op Note (Signed)
06/09/2014  1:56 PM  PATIENT:  Todd [name]  53 y.o. male  PRE-OPERATIVE DIAGNOSIS:  left knee medial mensical tear  POST-OPERATIVE DIAGNOSIS:  left knee medial mensical tear  PROCEDURE:  Procedure(s): LEFT KNEE ARTHROSCOPY WITH PARTIAL MEDIAL MENISECTOMY, SYNOVECTOMY SUPRAPATELLA (Left)  SURGEON:  Surgeon(s) and Role:    * Tobi Bastos, MD - Primary     ASSISTANTS: OR TECH   ANESTHESIA:   general  EBL:     BLOOD ADMINISTERED:none  DRAINS: none   LOCAL MEDICATIONS USED:  MARCAINE  30CC OF0.25% WITH ePINEPHRINE   SPECIMEN:  No Specimen  DISPOSITION OF SPECIMEN:  N/A  COUNTS:  YES  TOURNIQUET:  * No tourniquets in log *  DICTATION: .Other Dictation: Dictation Number [id number]  PLAN OF CARE: Discharge to home after PACU  PATIENT DISPOSITION:  PACU - hemodynamically stable.   Delay start of Pharmacological VTE agent (>24hrs) due to surgical blood loss or risk of bleeding: yes

## 2014-06-09 NOTE — Brief Op Note (Signed)
06/09/2014  2:15 PM  PATIENT:  Todd [name]  53 y.o. male  PRE-OPERATIVE DIAGNOSIS:  left knee medial mensical tear  POST-OPERATIVE DIAGNOSIS:  left knee medial mensical tear  PROCEDURE:  Procedure(s): LEFT KNEE ARTHROSCOPY WITH PARTIAL MEDIAL MENISECTOMY, SYNOVECTOMY SUPRAPATELLA (Left)  SURGEON:  Surgeon(s) and Role:    * Tobi Bastos, MD - Primary     ASSISTANTS:or tECH  ANESTHESIA:   general  EBL:  Total I/O In: 500 [I.V.:500] Out: -   BLOOD ADMINISTERED:none  DRAINS: none   LOCAL MEDICATIONS USED:  MARCAINE   30CC OF 0.25% WITH ePINEPHRINE  SPECIMEN:  No Specimen  DISPOSITION OF SPECIMEN:  N/A  COUNTS:  YES  TOURNIQUET:  * No tourniquets in log *  DICTATION: .Other Dictation: Dictation Number [id number]  PLAN OF CARE: Discharge to home after PACU  PATIENT DISPOSITION:  PACU - hemodynamically stable.   Delay start of Pharmacological VTE agent (>24hrs) due to surgical blood loss or risk of bleeding: yes

## 2014-06-10 ENCOUNTER — Encounter (HOSPITAL_COMMUNITY): Payer: Self-pay | Admitting: Orthopedic Surgery

## 2014-06-10 NOTE — Op Note (Signed)
NAMECAILAN, GENERAL NO.:  [id number]  MEDICAL RECORD NO.:  [id number]  LOCATION:  [facility]                         FACILITY:  [facility]  PHYSICIAN:  Kipp Brood. Natoya Viscomi, M.D.DATE OF BIRTH:  11/02/1961  DATE OF PROCEDURE:  06/09/2014 DATE OF DISCHARGE:  06/09/2014                              OPERATIVE REPORT   SURGEON:  Jori Moll A. Martie Muhlbauer, M.D.  ASSISTANT:  The OR Tech.  PREOPERATIVE DIAGNOSES: 1. Acute tear of the medial meniscus of the left knee. 2. Osteoarthritis, left knee. 3. Severe synovitis in the suprapatellar pouch, left knee.  POSTOPERATIVE DIAGNOSES: 1. Acute tear of the medial meniscus of the left knee. 2. Osteoarthritis, left knee. 3. Severe synovitis in the suprapatellar pouch, left knee.  OPERATION: 1. Diagnostic arthroscopy, left knee. 2. Medial meniscectomy of the posterior horn, left knee. 3. Synovectomy suprapatellar pouch, left knee.  DESCRIPTION OF PROCEDURE:  Under general anesthesia, routine orthopedic prep and draping of the left lower extremity was carried out.  I did the appropriate time-out first.  I also marked the appropriate left leg in the holding area.  He had clindamycin 900 mg IV.  At this time, a small punctate incision made in the suprapatellar pouch.  Note, the left leg was in a knee holder.  I then inserted the cannula and distended the knee with saline.  Another small punctate incision made in the anterolateral joint.  The arthroscope was entered from lateral approach and a complete diagnostic arthroscopy was carried out, the main problems were as follows. 1. He had a significant amount of synovitis in the suprapatellar     pouch.  Patellofemoral joint looked fine except for some small     spurs of the patella.  I did a synovectomy in the suprapatellar     pouch.  I then went down the lateral joint.  The lateral joint     looked clean.  There was no need to do anything on that side.     Cruciates were intact.  I went over  the medial joint, where the     main problem was.  He had a re-tear of his medial meniscus, left     knee.  He also had some obvious osteoarthritic changes mainly in     the tibial plateau.  I thoroughly irrigated out the knee,     reinspected the knee.  There were no other abnormalities noted.  I     then removed all the fluid and closed all 3 punctate incisions with     3-0 nylon suture.  I injected 30 mL of 0.25% Marcaine with     epinephrine in the knee joint and a sterile Neosporin bundle     dressing was applied.  The patient left the operating room in     satisfactory condition.  Note prior to surgery, he had some muscle     wall pain and now the anesthesiologist decided to give him some     aspirin.  He does have 2 stents and he was cleared for preop. 2. Note, postop, he will be on aspirin 325 mg b.i.d. as an     anticoagulant. 3. He  will be on Percocet 10/325, one every 3-4 hours p.r.n. for pain. 4. He will be on crutches, partial weightbearing as tolerated. 5. I will see me in the office 10-12 days or prior to that if there is     a problem.          ______________________________ Kipp Brood Gladstone Lighter, M.D.     RAG/MEDQ  D:  06/09/2014  T:  06/09/2014  Job:  [id number]

## 2014-06-25 ENCOUNTER — Telehealth: Payer: Self-pay

## 2014-06-25 NOTE — Telephone Encounter (Signed)
Received clearance from [employer].Dr.Jordan cleared patient.Advised Effient may be stopped,needs to stay on aspirin for dental procedure.Note faxed to Anastasio Auerbach at [place] office fax # [phone].

## 2014-06-30 ENCOUNTER — Other Ambulatory Visit: Payer: Self-pay | Admitting: Family Medicine

## 2014-06-30 NOTE — Telephone Encounter (Signed)
Medication filled x1 with no refills.   Requires office visit before any further refills can be given.   Letter sent.  

## 2014-07-10 ENCOUNTER — Ambulatory Visit: Payer: Managed Care, Other (non HMO) | Admitting: Family Medicine

## 2014-07-23 ENCOUNTER — Ambulatory Visit (INDEPENDENT_AMBULATORY_CARE_PROVIDER_SITE_OTHER): Payer: Managed Care, Other (non HMO) | Admitting: Family Medicine

## 2014-07-23 ENCOUNTER — Encounter: Payer: Self-pay | Admitting: Family Medicine

## 2014-07-23 VITALS — BP 128/74 | HR 64 | Temp 97.3°F | Resp 18 | Ht 67.5 in | Wt 204.0 lb

## 2014-07-23 DIAGNOSIS — E785 Hyperlipidemia, unspecified: Secondary | ICD-10-CM

## 2014-07-23 DIAGNOSIS — F17200 Nicotine dependence, unspecified, uncomplicated: Secondary | ICD-10-CM

## 2014-07-23 DIAGNOSIS — Z125 Encounter for screening for malignant neoplasm of prostate: Secondary | ICD-10-CM

## 2014-07-23 DIAGNOSIS — I10 Essential (primary) hypertension: Secondary | ICD-10-CM

## 2014-07-23 LAB — COMPLETE METABOLIC PANEL WITH GFR
ALBUMIN: 4.1 g/dL (ref 3.5–5.2)
ALT: 30 U/L (ref 0–53)
AST: 19 U/L (ref 0–37)
Alkaline Phosphatase: 70 U/L (ref 39–117)
BILIRUBIN TOTAL: 0.6 mg/dL (ref 0.2–1.2)
BUN: 13 mg/dL (ref 6–23)
CO2: 26 meq/L (ref 19–32)
Calcium: 8.8 mg/dL (ref 8.4–10.5)
Chloride: 105 mEq/L (ref 96–112)
Creat: 1.24 mg/dL (ref 0.50–1.35)
GFR, Est African American: 75 mL/min
GFR, Est Non African American: 65 mL/min
GLUCOSE: 108 mg/dL — AB (ref 70–99)
POTASSIUM: 4.2 meq/L (ref 3.5–5.3)
SODIUM: 138 meq/L (ref 135–145)
TOTAL PROTEIN: 6.3 g/dL (ref 6.0–8.3)

## 2014-07-23 LAB — LIPID PANEL
CHOL/HDL RATIO: 3.9 ratio
Cholesterol: 124 mg/dL (ref 0–200)
HDL: 32 mg/dL — ABNORMAL LOW (ref >39)
LDL CALC: 65 mg/dL (ref 0–99)
Triglycerides: 134 mg/dL (ref <150)
VLDL: 27 mg/dL (ref 0–40)
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
## 2014-07-24 LAB — PSA: PSA: 0.3 ng/mL (ref <=4.00)

## 2014-08-17 ENCOUNTER — Other Ambulatory Visit: Payer: Self-pay | Admitting: Family Medicine

## 2014-08-17 NOTE — Telephone Encounter (Signed)
Refill appropriate and filled per protocol. 

## 2014-09-18 ENCOUNTER — Other Ambulatory Visit: Payer: Self-pay | Admitting: Family Medicine

## 2014-09-29 ENCOUNTER — Encounter (HOSPITAL_COMMUNITY): Payer: Self-pay | Admitting: Emergency Medicine

## 2014-09-29 ENCOUNTER — Emergency Department (HOSPITAL_COMMUNITY)
Admission: EM | Admit: 2014-09-29 | Discharge: 2014-09-29 | Disposition: A | Discharge status: Home / Self Care | Payer: Managed Care, Other (non HMO) | Attending: Emergency Medicine | Admitting: Emergency Medicine

## 2014-09-29 DIAGNOSIS — M1990 Unspecified osteoarthritis, unspecified site: Secondary | ICD-10-CM | POA: Diagnosis not present

## 2014-09-29 DIAGNOSIS — Z7982 Long term (current) use of aspirin: Secondary | ICD-10-CM | POA: Diagnosis not present

## 2014-09-29 DIAGNOSIS — Z9889 Other specified postprocedural states: Secondary | ICD-10-CM | POA: Diagnosis not present

## 2014-09-29 DIAGNOSIS — I252 Old myocardial infarction: Secondary | ICD-10-CM | POA: Diagnosis not present

## 2014-09-29 DIAGNOSIS — T7840XA Allergy, unspecified, initial encounter: Secondary | ICD-10-CM

## 2014-09-29 DIAGNOSIS — Z85828 Personal history of other malignant neoplasm of skin: Secondary | ICD-10-CM | POA: Insufficient documentation

## 2014-09-29 DIAGNOSIS — I10 Essential (primary) hypertension: Secondary | ICD-10-CM | POA: Diagnosis not present

## 2014-09-29 DIAGNOSIS — H11423 Conjunctival edema, bilateral: Secondary | ICD-10-CM | POA: Insufficient documentation

## 2014-09-29 DIAGNOSIS — R600 Localized edema: Secondary | ICD-10-CM | POA: Insufficient documentation

## 2014-09-29 DIAGNOSIS — I2510 Atherosclerotic heart disease of native coronary artery without angina pectoris: Secondary | ICD-10-CM | POA: Insufficient documentation

## 2014-09-29 DIAGNOSIS — Z79899 Other long term (current) drug therapy: Secondary | ICD-10-CM | POA: Diagnosis not present

## 2014-09-29 DIAGNOSIS — Z9861 Coronary angioplasty status: Secondary | ICD-10-CM | POA: Diagnosis not present

## 2014-09-29 DIAGNOSIS — Z720 Tobacco use: Secondary | ICD-10-CM | POA: Insufficient documentation

## 2014-09-29 DIAGNOSIS — E785 Hyperlipidemia, unspecified: Secondary | ICD-10-CM | POA: Insufficient documentation

## 2014-09-29 DIAGNOSIS — R21 Rash and other nonspecific skin eruption: Secondary | ICD-10-CM | POA: Diagnosis not present

## 2014-09-29 DIAGNOSIS — J029 Acute pharyngitis, unspecified: Secondary | ICD-10-CM | POA: Diagnosis not present

## 2014-09-29 DIAGNOSIS — Z880 Allergy status to penicillin: Secondary | ICD-10-CM | POA: Diagnosis not present

## 2014-09-29 MED ORDER — EPINEPHRINE 0.3 MG/0.3ML IJ SOAJ: 0.3000 mg | Freq: Once | INTRAMUSCULAR | Status: DC | PRN

## 2014-09-29 MED ORDER — SODIUM CHLORIDE 0.9 % IV BOLUS (SEPSIS)
500.0000 mL | Freq: Once | INTRAVENOUS | Status: AC
  Administered 2014-09-29: 500 mL via INTRAVENOUS

## 2014-09-29 MED ORDER — FAMOTIDINE IN NACL 20-0.9 MG/50ML-% IV SOLN
20.0000 mg | Freq: Once | INTRAVENOUS | Status: AC
  Administered 2014-09-29: 20 mg via INTRAVENOUS
  Filled 2014-09-29: qty 50

## 2014-09-29 MED ORDER — DIPHENHYDRAMINE HCL 25 MG PO TABS: 25.0000 mg | ORAL_TABLET | Freq: Four times a day (QID) | ORAL | Status: DC | PRN

## 2014-09-29 MED ORDER — FAMOTIDINE 20 MG PO TABS: 20.0000 mg | ORAL_TABLET | Freq: Two times a day (BID) | ORAL | Status: DC

## 2014-09-29 MED ORDER — METHYLPREDNISOLONE SODIUM SUCC 125 MG IJ SOLR
125.0000 mg | Freq: Once | INTRAMUSCULAR | Status: AC
  Administered 2014-09-29: 125 mg via INTRAVENOUS
  Filled 2014-09-29: qty 2

## 2014-09-29 MED ORDER — DIPHENHYDRAMINE HCL 50 MG/ML IJ SOLN
50.0000 mg | Freq: Once | INTRAMUSCULAR | Status: AC
  Administered 2014-09-29: 50 mg via INTRAVENOUS
  Filled 2014-09-29: qty 1

## 2014-09-29 NOTE — Discharge Instructions (Signed)
Please follow up with your primary care physician in 1-2 days. If you do not have one please call the [facility] number listed above. If you take your EpiPen please return immediately to the emergency department. Please take Benadryl and/or Pepcid as prescribed for the next 2-3 days. Please read all discharge instructions and return precautions.   Anaphylactic Reaction An anaphylactic reaction is a sudden, severe allergic reaction that involves the whole body. It can be life threatening. A hospital stay is often required. People with asthma, eczema, or hay fever are slightly more likely to have an anaphylactic reaction. CAUSES  An anaphylactic reaction may be caused by anything to which you are allergic. After being exposed to the allergic substance, your immune system becomes sensitized to it. When you are exposed to that allergic substance again, an allergic reaction can occur. Common causes of an anaphylactic reaction include:  Medicines.  Foods, especially peanuts, wheat, shellfish, milk, and eggs.  Insect bites or stings.  Blood products.  Chemicals, such as dyes, latex, and contrast material used for imaging tests. SYMPTOMS  When an allergic reaction occurs, the body releases histamine and other substances. These substances cause symptoms such as tightening of the airway. Symptoms often develop within seconds or minutes of exposure. Symptoms may include:  Skin rash or hives.  Itching.  Chest tightness.  Swelling of the eyes, tongue, or lips.  Trouble breathing or swallowing.  Lightheadedness or fainting.  Anxiety or confusion.  Stomach pains, vomiting, or diarrhea.  Nasal congestion.  A fast or irregular heartbeat (palpitations). DIAGNOSIS  Diagnosis is based on your history of recent exposure to allergic substances, your symptoms, and a physical exam. Your caregiver may also perform blood or urine tests to confirm the diagnosis. TREATMENT    Epinephrine medicine is the main treatment for an anaphylactic reaction. Other medicines that may be used for treatment include antihistamines, steroids, and albuterol. In severe cases, fluids and medicine to support blood pressure may be given through an intravenous line (IV). Even if you improve after treatment, you need to be observed to make sure your condition does not get worse. This may require a stay in the hospital. [place] a medical alert bracelet or necklace stating your allergy.  You and your family must learn how to use an anaphylaxis kit or give an epinephrine injection to temporarily treat an emergency allergic reaction. Always carry your epinephrine injection or anaphylaxis kit with you. This can be lifesaving if you have a severe reaction.  Do not drive or perform tasks after treatment until the medicines used to treat your reaction have worn off, or until your caregiver says it is okay.  If you have hives or a rash:  Take medicines as directed by your caregiver.  You may use an over-the-counter antihistamine (diphenhydramine) as needed.  Apply cold compresses to the skin or take baths in cool water. Avoid hot baths or showers. SEEK MEDICAL CARE IF:   You develop symptoms of an allergic reaction to a new substance. Symptoms may start right away or minutes later.  You develop a rash, hives, or itching.  You develop new symptoms. SEEK IMMEDIATE MEDICAL CARE IF:   You have swelling of the mouth, difficulty breathing, or wheezing.  You have a tight feeling in your chest or throat.  You develop hives, swelling, or itching all over your body.  You develop severe vomiting or diarrhea.  You feel faint or pass out. This  is an emergency. Use your epinephrine injection or anaphylaxis kit as you have been instructed. Call your local emergency services (911 in [place] Even if you improve after the injection, you need to be examined at a hospital emergency  department. MAKE SURE YOU:   Understand these instructions.  Will watch your condition.  Will get help right away if you are not doing well or get worse. Document Released: 10/23/2005 Document Revised: 10/28/2013 Document Reviewed: 01/24/2012 [facility] Patient Information 2015 [place], [place]. This information is not intended to replace advice given to you by your health care provider. Make sure you discuss any questions you have with your health care provider.

## 2014-09-29 NOTE — ED Notes (Signed)
EDP at bedside  

## 2014-09-29 NOTE — ED Notes (Signed)
Pt. Stated, Ive had eyes swelling, sore throat, redness.  I've had this for the last 3 years same time.  I have taken Zyrtec all day and no help.
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
## 2014-09-29 NOTE — ED Provider Notes (Signed)
Patient care acquired from [facility], PA-C.   Physical Exam  BP 113/76 mmHg  Pulse 62  Temp(Src) 97.6 F (36.4 C) (Oral)  Resp 18  SpO2 98%  Physical Exam  Constitutional: He is oriented to person, place, and time. He appears well-developed and well-nourished. No distress.  HENT:  Head: Normocephalic and atraumatic.  Right Ear: External ear normal.  Left Ear: External ear normal.  Nose: Nose normal.  Mouth/Throat: Oropharynx is clear and moist. No oropharyngeal exudate.  Erythema of the entire face and neck with mild periorbital edema.  No posterior oropharyngeal edema. No angioedema. No swelling of uvula.  Eyes: Conjunctivae are normal.  Neck: Normal range of motion. Neck supple.  Cardiovascular: Normal rate, regular rhythm and normal heart sounds.   Pulmonary/Chest: Effort normal and breath sounds normal. No stridor. No respiratory distress. He has no wheezes.  Abdominal: Soft.  Musculoskeletal: Normal range of motion.  Neurological: He is alert and oriented to person, place, and time.  Skin: Skin is warm and dry. He is not diaphoretic. There is erythema.  Psychiatric: He has a normal mood and affect.  Nursing note and vitals reviewed.   ED Course  Procedures Medications  sodium chloride 0.9 % bolus 500 mL (0 mLs Intravenous Stopped 09/29/14 1931)  diphenhydrAMINE (BENADRYL) injection 50 mg (50 mg Intravenous Given 09/29/14 1915)  methylPREDNISolone sodium succinate (SOLU-MEDROL) 125 mg/2 mL injection 125 mg (125 mg Intravenous Given 09/29/14 1913)  famotidine (PEPCID) IVPB 20 mg (0 mg Intravenous Stopped 09/29/14 2022)    On reevaluation of patient's erythema is nearly resolved. Periorbital edema resolved. Patient continues to endorse he has no shortness of breath, nausea, vomiting, chest tightness, sensation of throat is closing, lip or tongue swelling. Physical exam remains benign.   MDM Filed Vitals:   09/29/14 2230  BP: 128/67  Pulse: 60  Temp: 97.8  F (36.6 C)  Resp: 18   1. Allergic reaction, initial encounter    Patient re-evaluated prior to dc, is hemodynamically stable, in no respiratory distress, and denies the feeling of throat closing. Pt has been advised to take OTC benadryl & return to the ED if they have a mod-severe allergic rxn (s/s including throat closing, difficulty breathing, swelling of lips face or tongue). Pt is to follow up with their PCP. Pt is agreeable with plan & verbalizes understanding. Patient is stable at time of discharge       Harlow Mares, PA-C 09/30/14 [place], MD 10/01/14 [phone]

## 2014-10-05 ENCOUNTER — Other Ambulatory Visit: Payer: Self-pay | Admitting: Family Medicine

## 2014-10-15 ENCOUNTER — Encounter (HOSPITAL_COMMUNITY): Payer: Self-pay | Admitting: Cardiovascular Disease

## 2014-12-02 ENCOUNTER — Encounter: Payer: Self-pay | Admitting: Family Medicine

## 2015-01-01 ENCOUNTER — Other Ambulatory Visit: Payer: Self-pay | Admitting: Physician Assistant

## 2015-01-01 ENCOUNTER — Emergency Department (HOSPITAL_COMMUNITY)
Admission: EM | Admit: 2015-01-01 | Discharge: 2015-01-01 | Disposition: A | Discharge status: Home / Self Care | Payer: Managed Care, Other (non HMO) | Attending: Emergency Medicine | Admitting: Emergency Medicine

## 2015-01-01 ENCOUNTER — Emergency Department (HOSPITAL_COMMUNITY): Payer: Managed Care, Other (non HMO)

## 2015-01-01 ENCOUNTER — Encounter (HOSPITAL_COMMUNITY): Payer: Self-pay | Admitting: *Deleted

## 2015-01-01 ENCOUNTER — Encounter: Payer: Self-pay | Admitting: Cardiovascular Disease

## 2015-01-01 DIAGNOSIS — M1990 Unspecified osteoarthritis, unspecified site: Secondary | ICD-10-CM | POA: Diagnosis not present

## 2015-01-01 DIAGNOSIS — I2510 Atherosclerotic heart disease of native coronary artery without angina pectoris: Secondary | ICD-10-CM | POA: Diagnosis not present

## 2015-01-01 DIAGNOSIS — R079 Chest pain, unspecified: Secondary | ICD-10-CM | POA: Insufficient documentation

## 2015-01-01 DIAGNOSIS — Z79899 Other long term (current) drug therapy: Secondary | ICD-10-CM | POA: Insufficient documentation

## 2015-01-01 DIAGNOSIS — I10 Essential (primary) hypertension: Secondary | ICD-10-CM | POA: Insufficient documentation

## 2015-01-01 DIAGNOSIS — Z720 Tobacco use: Secondary | ICD-10-CM | POA: Insufficient documentation

## 2015-01-01 DIAGNOSIS — Z7982 Long term (current) use of aspirin: Secondary | ICD-10-CM | POA: Diagnosis not present

## 2015-01-01 DIAGNOSIS — E785 Hyperlipidemia, unspecified: Secondary | ICD-10-CM | POA: Diagnosis not present

## 2015-01-01 DIAGNOSIS — Z85828 Personal history of other malignant neoplasm of skin: Secondary | ICD-10-CM | POA: Insufficient documentation

## 2015-01-01 DIAGNOSIS — R0789 Other chest pain: Secondary | ICD-10-CM

## 2015-01-01 DIAGNOSIS — R072 Precordial pain: Secondary | ICD-10-CM

## 2015-01-01 LAB — I-STAT CHEM 8, ED
BUN: 8 mg/dL (ref 6–23)
Calcium, Ion: 1.17 mmol/L (ref 1.12–1.23)
Chloride: 100 mmol/L (ref 96–112)
Creatinine, Ser: 0.8 mg/dL (ref 0.50–1.35)
Glucose, Bld: 82 mg/dL (ref 70–99)
HCT: 50 % (ref 39.0–52.0)
HEMOGLOBIN: 17 g/dL (ref 13.0–17.0)
Potassium: 3.9 mmol/L (ref 3.5–5.1)
SODIUM: 141 mmol/L (ref 135–145)
TCO2: 23 mmol/L (ref 0–100)

## 2015-01-01 LAB — I-STAT TROPONIN, ED
TROPONIN I, POC: 0.01 ng/mL (ref 0.00–0.08)
Troponin i, poc: 0 ng/mL (ref 0.00–0.08)

## 2015-01-01 NOTE — H&P (Signed)
Entered in wrong format, ignore, please see consult note on the same day  Hilbert Corrigan PA Pager: [phone]

## 2015-01-01 NOTE — ED Notes (Signed)
Pt arrives from work via [employer]. Pt states he began having an uneasy feeling and then sat down for a break with a cup of coffee. Pt states he then began having a pinching feeling in his central chest to left chest along with diaphoresis and dizziness. Pt denies SOB, n/v, and radiation. Pt states he took 2 of nitro and a baby aspirin and that resolved his chest pain. Pt states ASA allergy, but he takes a baby aspirin daily. Pt is a [place] pt and has a cards appt in April.
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
## 2015-01-01 NOTE — Discharge Instructions (Signed)
Please call your doctor for a followup appointment within 24-48 hours. When you talk to your doctor please let them know that you were seen in the emergency department and have them acquire all of your records so that they can discuss the findings with you and formulate a treatment plan to fully care for your new and ongoing problems. ° °

## 2015-01-01 NOTE — ED Notes (Signed)
Cardiology at bedside.

## 2015-01-01 NOTE — ED Provider Notes (Signed)
Patient received at change of shift, cardiology provider physician assistant has seen the patient, discussed with team, the patient is felt to be low risk for an acute coronary event given the quality and description of the patient's symptoms. They have ordered a second troponin and have arranged an outpatient stress test, they state that the patient can go home if the troponin is negative.  Second troponin neg, pt informed, appears stable for d/c.  Johnna Acosta, MD 01/01/15 2021

## 2015-01-05 ENCOUNTER — Telehealth: Payer: Self-pay | Admitting: Cardiology

## 2015-01-05 NOTE — Telephone Encounter (Signed)
Returned call to patient he stated he is scheduled for a myoview 01/07/15 and needed to know his instructions.Myoview instructions reviewed with patient.Advised to hold atenolol 24 hrs before myoview.Myoview is scheduled 01/07/15 at 9:15 am at our [facility] office.Follow up appointment scheduled with Dr.Jordan 01/20/15 at 12:00 noon.Patient stated he wanted to ask Dr.Jordan if myoview accurate.Stated his brother n law who has 6 stents was told by his Dr a person with stents would never pass a stress test.Will let Dr.Jordan know.

## 2015-01-05 NOTE — Telephone Encounter (Signed)
New Message  Pt wife called wanting to know more information on Myoview sched for 3/3. Please call back and discuss.

## 2015-01-07 ENCOUNTER — Ambulatory Visit (HOSPITAL_COMMUNITY): Payer: Managed Care, Other (non HMO) | Attending: Internal Medicine | Admitting: Radiology

## 2015-01-07 DIAGNOSIS — I2510 Atherosclerotic heart disease of native coronary artery without angina pectoris: Secondary | ICD-10-CM

## 2015-01-07 DIAGNOSIS — R0789 Other chest pain: Secondary | ICD-10-CM | POA: Diagnosis not present

## 2015-01-07 MED ORDER — TECHNETIUM TC 99M SESTAMIBI GENERIC - CARDIOLITE
33.0000 | Freq: Once | INTRAVENOUS | Status: AC | PRN
  Administered 2015-01-07: 33 via INTRAVENOUS

## 2015-01-07 MED ORDER — TECHNETIUM TC 99M SESTAMIBI GENERIC - CARDIOLITE
11.0000 | Freq: Once | INTRAVENOUS | Status: AC | PRN
  Administered 2015-01-07: 11 via INTRAVENOUS

## 2015-01-07 MED ORDER — REGADENOSON 0.4 MG/5ML IV SOLN
0.4000 mg | Freq: Once | INTRAVENOUS | Status: AC
  Administered 2015-01-07: 0.4 mg via INTRAVENOUS

## 2015-01-07 NOTE — Progress Notes (Signed)
[facility] [address] [place], [place] [address] [phone]    Cardiology Nuclear Med Study  Todd [name] is a 54 y.o. male     MRN : [id number]     DOB: [birth year]  Procedure Date: 01/07/2015  Nuclear Med Background Indication for Stress Test:  Evaluation for Ischemia and [name] 2/16 CP, Negative enzymes History:  CAD-NSTEMI-STENT 2014  Cardiac Risk Factors: Hypertension  Symptoms:  Chest Pain and Fatigue   Nuclear Pre-Procedure Caffeine/Decaff Intake:  None NPO After: 9:00pm   Lungs:  clear O2 Sat: 95% on room air. IV 0.9% NS with Angio Cath:  22g  IV Site: R Hand  IV Started by:  Crissie Figures, RN  Chest Size (in):  46 Cup Size: n/a  Height: 5' 7.5" (1.715 m)  Weight:  198 lb (89.812 kg)  BMI:  Body mass index is 30.54 kg/(m^2). Tech Comments:  N/A    Nuclear Med Study 1 or 2 day study: 1 day  Stress Test Type:  Treadmill/Lexiscan  Reading MD: N/A  Order Authorizing Provider:  Peter Martinique, MD  Resting Radionuclide: Technetium 25m Sestamibi  Resting Radionuclide Dose: 11.0 mCi   Stress Radionuclide:  Technetium 5m Sestamibi  Stress Radionuclide Dose: 33.0 mCi           Stress Protocol Rest HR: 55 Stress HR: 116  Rest BP: 131/80 Stress BP: 191/97  Exercise Time (min): 9:07 METS: 8.40   Predicted Max HR: 164 bpm % Max HR: 70.73 bpm Rate Pressure Product: 22156   Dose of Adenosine (mg):  n/a Dose of Lexiscan: 0.4 mg  Dose of Atropine (mg): n/a Dose of Dobutamine: n/a mcg/kg/min (at max HR)  Stress Test Technologist: Perrin Maltese, EMT-P  Nuclear Technologist:  Earl Many, CNMT     Rest Procedure:  Myocardial perfusion imaging was performed at rest 45 minutes following the intravenous administration of Technetium 18m Sestamibi. Rest ECG: NSR, old inferior MI  Stress Procedure:  The patient received IV Lexiscan 0.4 mg over 15-seconds with concurrent low level exercise and then Technetium 82m Sestamibi was injected  at 30-seconds while the patient continued walking one more minute.  Quantitative spect images were obtained after a 45-minute delay. Stress ECG: No significant change from baseline ECG  QPS Raw Data Images:  Normal; no motion artifact; normal heart/lung ratio. Stress Images:  There is decreased uptake in the inferior wall. The defect is moderate to severe in intensity, but small to moderate in size. Rest Images:  there is partial improvement in inferior wall tracer uptake Subtraction (SDS):  These findings are consistent with ischemia.  There is a probable component of nontransmural scar. Transient Ischemic Dilatation (Normal <1.22):  1.05 Lung/Heart Ratio (Normal <0.45):  0.39  Quantitative Gated Spect Images QGS EDV:  146 ml QGS ESV:  64 ml  Impression Exercise Capacity:  Lexiscan with no exercise. BP Response:  Normal blood pressure response. Clinical Symptoms:  No significant symptoms noted. ECG Impression:  No significant ECG changes with Lexiscan. Comparison with Prior Nuclear Study: No previous nuclear study performed  Overall Impression:  Intermediate risk stress nuclear study with mixed ischemia (dominant) and scar (less significant) in the basal and mid inferior wall..  LV Ejection Fraction: 56%.  LV Wall Motion:  NL LV Function; NL Wall Motion   Sanda Klein, MD, [facility] [phone] office [phone] pager

## 2015-01-08 NOTE — Telephone Encounter (Signed)
Received call back from patient myoview results given.Advised to keep appointment with Dr.Jordan 01/20/15 12:00 noon.Stated he will be bringing forms from his work to be completed.

## 2015-01-08 NOTE — Telephone Encounter (Signed)
Returned call to patient no answer.LMTC. 

## 2015-01-14 ENCOUNTER — Ambulatory Visit (INDEPENDENT_AMBULATORY_CARE_PROVIDER_SITE_OTHER): Payer: Managed Care, Other (non HMO) | Admitting: Cardiology

## 2015-01-14 ENCOUNTER — Encounter: Payer: Self-pay | Admitting: Cardiology

## 2015-01-14 VITALS — BP 138/86 | HR 62 | Ht 67.5 in | Wt 207.9 lb

## 2015-01-14 DIAGNOSIS — E785 Hyperlipidemia, unspecified: Secondary | ICD-10-CM

## 2015-01-14 DIAGNOSIS — Z720 Tobacco use: Secondary | ICD-10-CM

## 2015-01-14 DIAGNOSIS — I25118 Atherosclerotic heart disease of native coronary artery with other forms of angina pectoris: Secondary | ICD-10-CM

## 2015-01-14 DIAGNOSIS — I10 Essential (primary) hypertension: Secondary | ICD-10-CM
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
## 2015-01-14 NOTE — Patient Instructions (Signed)
Continue your current therapy  I will see you in 6 months  You need to quit smoking   

## 2015-01-18 ENCOUNTER — Encounter: Payer: Self-pay | Admitting: Family Medicine

## 2015-01-20 ENCOUNTER — Ambulatory Visit: Payer: Self-pay | Admitting: Cardiology

## 2015-01-22 ENCOUNTER — Telehealth: Payer: Self-pay

## 2015-01-22 NOTE — Telephone Encounter (Signed)
Spoke to patient's wife.Advised patient ask would Dr.Jordan prescribe anxiety medication.Advised will need to have PCP prescribe.

## 2015-02-09 ENCOUNTER — Other Ambulatory Visit: Payer: Self-pay | Admitting: Family Medicine

## 2015-02-10 NOTE — Telephone Encounter (Signed)
Medication refilled per protocol. 

## 2015-02-15 ENCOUNTER — Ambulatory Visit: Payer: Self-pay | Admitting: Cardiology

## 2015-04-06 ENCOUNTER — Other Ambulatory Visit: Payer: Self-pay | Admitting: Family Medicine

## 2015-05-15 ENCOUNTER — Other Ambulatory Visit: Payer: Self-pay | Admitting: Family Medicine

## 2015-06-23 ENCOUNTER — Other Ambulatory Visit: Payer: Self-pay | Admitting: Family Medicine

## 2015-06-26 ENCOUNTER — Other Ambulatory Visit: Payer: Self-pay | Admitting: Family Medicine

## 2015-08-02 ENCOUNTER — Other Ambulatory Visit: Payer: Self-pay | Admitting: Family Medicine

## 2015-08-09 ENCOUNTER — Encounter: Payer: Self-pay | Admitting: *Deleted

## 2015-08-09 ENCOUNTER — Other Ambulatory Visit: Payer: Self-pay | Admitting: Family Medicine

## 2015-08-09 NOTE — Telephone Encounter (Signed)
Medication filled x1 with no refills.   Requires office visit before any further refills can be given.   Letter sent.  

## 2015-08-17 ENCOUNTER — Ambulatory Visit: Payer: Managed Care, Other (non HMO) | Admitting: Family Medicine

## 2015-08-24 ENCOUNTER — Ambulatory Visit (INDEPENDENT_AMBULATORY_CARE_PROVIDER_SITE_OTHER): Payer: Managed Care, Other (non HMO) | Admitting: Family Medicine

## 2015-08-24 ENCOUNTER — Encounter: Payer: Self-pay | Admitting: Family Medicine

## 2015-08-24 VITALS — BP 126/72 | HR 62 | Temp 98.0°F | Resp 14 | Ht 67.5 in | Wt 201.0 lb

## 2015-08-24 DIAGNOSIS — F17200 Nicotine dependence, unspecified, uncomplicated: Secondary | ICD-10-CM

## 2015-08-24 DIAGNOSIS — Z125 Encounter for screening for malignant neoplasm of prostate: Secondary | ICD-10-CM | POA: Diagnosis not present

## 2015-08-24 DIAGNOSIS — I10 Essential (primary) hypertension: Secondary | ICD-10-CM

## 2015-08-24 DIAGNOSIS — E785 Hyperlipidemia, unspecified: Secondary | ICD-10-CM | POA: Diagnosis not present

## 2015-08-24 DIAGNOSIS — Z720 Tobacco use: Secondary | ICD-10-CM | POA: Diagnosis not present

## 2015-08-24 LAB — COMPLETE METABOLIC PANEL WITH GFR
ALK PHOS: 57 U/L (ref 40–115)
ALT: 24 U/L (ref 9–46)
AST: 22 U/L (ref 10–35)
Albumin: 4.1 g/dL (ref 3.6–5.1)
BUN: 13 mg/dL (ref 7–25)
CALCIUM: 8.5 mg/dL — AB (ref 8.6–10.3)
CO2: 25 mmol/L (ref 20–31)
Chloride: 105 mmol/L (ref 98–110)
Creat: 0.84 mg/dL (ref 0.70–1.33)
GFR, Est African American: >89 mL/min (ref >=60)
GFR, Est Non African American: >89 mL/min (ref >=60)
Glucose, Bld: 93 mg/dL (ref 70–99)
POTASSIUM: 4.2 mmol/L (ref 3.5–5.3)
Sodium: 139 mmol/L (ref 135–146)
Total Bilirubin: 0.5 mg/dL (ref 0.2–1.2)
Total Protein: 6.2 g/dL (ref 6.1–8.1)

## 2015-08-24 LAB — LIPID PANEL
CHOLESTEROL: 110 mg/dL — AB (ref 125–200)
HDL: 27 mg/dL — ABNORMAL LOW (ref >=40)
LDL Cholesterol: 60 mg/dL (ref <130)
Total CHOL/HDL Ratio: 4.1 Ratio (ref <=5.0)
Triglycerides: 114 mg/dL (ref <150)
VLDL: 23 mg/dL (ref <30)

## 2015-08-24 LAB — CBC WITH DIFFERENTIAL/PLATELET
BASOS PCT: 0 % (ref 0–1)
Basophils Absolute: 0 10*3/uL (ref 0.0–0.1)
Eosinophils Absolute: 0.3 10*3/uL (ref 0.0–0.7)
Eosinophils Relative: 3 % (ref 0–5)
HCT: 47.4 % (ref 39.0–52.0)
Hemoglobin: 16 g/dL (ref 13.0–17.0)
Lymphocytes Relative: 27 % (ref 12–46)
Lymphs Abs: 2.6 10*3/uL (ref 0.7–4.0)
MCH: 31.8 pg (ref 26.0–34.0)
MCHC: 33.8 g/dL (ref 30.0–36.0)
MCV: 94.2 fL (ref 78.0–100.0)
MONO ABS: 0.8 10*3/uL (ref 0.1–1.0)
MPV: 10.6 fL (ref 8.6–12.4)
Monocytes Relative: 8 % (ref 3–12)
NEUTROS ABS: 5.9 10*3/uL (ref 1.7–7.7)
NEUTROS PCT: 62 % (ref 43–77)
Platelets: 143 10*3/uL — ABNORMAL LOW (ref 150–400)
RBC: 5.03 MIL/uL (ref 4.22–5.81)
RDW: 13.4 % (ref 11.5–15.5)
WBC: 9.5 10*3/uL (ref 4.0–10.5)

## 2015-08-24 MED ORDER — ATORVASTATIN CALCIUM 80 MG PO TABS: ORAL_TABLET | ORAL | Status: DC

## 2015-08-24 MED ORDER — LOSARTAN POTASSIUM 50 MG PO TABS: 50.0000 mg | ORAL_TABLET | Freq: Every day | ORAL | Status: DC

## 2015-08-24 MED ORDER — FAMOTIDINE 20 MG PO TABS: 20.0000 mg | ORAL_TABLET | Freq: Every day | ORAL | Status: DC | PRN

## 2015-08-24 MED ORDER — ATENOLOL 50 MG PO TABS: 50.0000 mg | ORAL_TABLET | Freq: Every day | ORAL | Status: DC
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
## 2015-08-25 LAB — PSA: PSA: 0.25 ng/mL (ref <=4.00)

## 2015-08-26 ENCOUNTER — Encounter: Payer: Self-pay | Admitting: Family Medicine

## 2015-11-29 ENCOUNTER — Ambulatory Visit (INDEPENDENT_AMBULATORY_CARE_PROVIDER_SITE_OTHER): Payer: Managed Care, Other (non HMO) | Admitting: Family Medicine

## 2015-11-29 ENCOUNTER — Encounter: Payer: Self-pay | Admitting: Family Medicine

## 2015-11-29 VITALS — BP 118/60 | HR 72 | Temp 97.6°F | Resp 18 | Wt 208.0 lb

## 2015-11-29 DIAGNOSIS — M25512 Pain in left shoulder: Secondary | ICD-10-CM | POA: Diagnosis not present
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
## 2016-04-11 ENCOUNTER — Other Ambulatory Visit: Payer: Self-pay | Admitting: Family Medicine

## 2016-04-12 ENCOUNTER — Encounter: Payer: Self-pay | Admitting: Cardiology

## 2016-04-14 ENCOUNTER — Other Ambulatory Visit: Payer: Self-pay

## 2016-04-14 MED ORDER — ATORVASTATIN CALCIUM 80 MG PO TABS: ORAL_TABLET | ORAL | Status: DC

## 2016-04-17 ENCOUNTER — Ambulatory Visit (INDEPENDENT_AMBULATORY_CARE_PROVIDER_SITE_OTHER): Payer: Managed Care, Other (non HMO) | Admitting: Cardiology

## 2016-04-17 ENCOUNTER — Encounter: Payer: Self-pay | Admitting: Cardiology

## 2016-04-17 VITALS — BP 132/70 | HR 58 | Ht 67.0 in | Wt 209.2 lb

## 2016-04-17 DIAGNOSIS — I2510 Atherosclerotic heart disease of native coronary artery without angina pectoris: Secondary | ICD-10-CM

## 2016-04-17 DIAGNOSIS — I10 Essential (primary) hypertension: Secondary | ICD-10-CM | POA: Diagnosis not present

## 2016-04-17 DIAGNOSIS — E785 Hyperlipidemia, unspecified: Secondary | ICD-10-CM | POA: Diagnosis not present
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
## 2016-04-17 NOTE — Patient Instructions (Signed)
Continue your current therapy  I will see you in one year   

## 2016-04-19 ENCOUNTER — Other Ambulatory Visit: Payer: Self-pay

## 2016-04-19 MED ORDER — NITROGLYCERIN 0.4 MG SL SUBL: 0.4000 mg | SUBLINGUAL_TABLET | SUBLINGUAL | Status: DC | PRN

## 2016-04-22 ENCOUNTER — Encounter: Payer: Self-pay | Admitting: Family Medicine

## 2016-05-12 ENCOUNTER — Other Ambulatory Visit: Payer: Self-pay | Admitting: Family Medicine

## 2016-05-12 NOTE — Telephone Encounter (Signed)
Refill appropriate and filled per protocol. 

## 2016-05-22 ENCOUNTER — Encounter: Payer: Self-pay | Admitting: Family Medicine

## 2016-06-12 ENCOUNTER — Telehealth: Payer: Self-pay | Admitting: Family Medicine

## 2016-06-12 MED ORDER — ATENOLOL 100 MG PO TABS: 50.0000 mg | ORAL_TABLET | Freq: Every day | ORAL | 0 refills | Status: DC

## 2016-06-12 NOTE — Telephone Encounter (Signed)
ok 

## 2016-06-12 NOTE — Telephone Encounter (Signed)
Pharmacy report 50 gm Atenolol on manufacturer back order until end of August.  Wants OK to give pt 100 mg tablet and have him cut in half for 1 month.  Verbal order given for #15 to hold pt until supply problem resolves.

## 2016-06-30 ENCOUNTER — Other Ambulatory Visit: Payer: Self-pay | Admitting: Family Medicine

## 2016-06-30 NOTE — Telephone Encounter (Signed)
Refill appropriate and filled per protocol. 

## 2016-07-07 ENCOUNTER — Encounter: Payer: Self-pay | Admitting: Family Medicine

## 2016-07-14 ENCOUNTER — Other Ambulatory Visit: Payer: Self-pay | Admitting: Family Medicine

## 2016-07-14 MED ORDER — CARVEDILOL 12.5 MG PO TABS: 12.5000 mg | ORAL_TABLET | Freq: Two times a day (BID) | ORAL | 3 refills | Status: DC

## 2016-08-14 ENCOUNTER — Encounter: Payer: Self-pay | Admitting: Family Medicine

## 2016-09-12 ENCOUNTER — Other Ambulatory Visit: Payer: Self-pay | Admitting: Orthopedic Surgery

## 2016-09-12 DIAGNOSIS — M5136 Other intervertebral disc degeneration, lumbar region: Secondary | ICD-10-CM

## 2016-09-19 ENCOUNTER — Other Ambulatory Visit: Payer: Self-pay | Admitting: Family Medicine

## 2016-09-20 ENCOUNTER — Ambulatory Visit
Admission: RE | Admit: 2016-09-20 | Discharge: 2016-09-20 | Disposition: A | Discharge status: Home / Self Care | Payer: Worker's Compensation | Source: Ambulatory Visit | Attending: Orthopedic Surgery | Admitting: Orthopedic Surgery

## 2016-09-20 DIAGNOSIS — M5136 Other intervertebral disc degeneration, lumbar region: Secondary | ICD-10-CM

## 2016-09-20 MED ORDER — MEPERIDINE HCL 100 MG/ML IJ SOLN
75.0000 mg | Freq: Once | INTRAMUSCULAR | Status: AC
  Administered 2016-09-20: 75 mg via INTRAMUSCULAR

## 2016-09-20 MED ORDER — IOPAMIDOL (ISOVUE-M 200) INJECTION 41%
15.0000 mL | Freq: Once | INTRAMUSCULAR | Status: AC
  Administered 2016-09-20: 15 mL via INTRATHECAL

## 2016-09-20 MED ORDER — ONDANSETRON HCL 4 MG/2ML IJ SOLN
4.0000 mg | Freq: Once | INTRAMUSCULAR | Status: AC
  Administered 2016-09-20: 4 mg via INTRAMUSCULAR

## 2016-09-20 MED ORDER — DIAZEPAM 5 MG PO TABS
10.0000 mg | ORAL_TABLET | Freq: Once | ORAL | Status: AC
  Administered 2016-09-20: 10 mg via ORAL

## 2016-09-20 NOTE — Discharge Instructions (Signed)

## 2016-09-22 ENCOUNTER — Telehealth: Payer: Self-pay

## 2016-09-22 NOTE — Telephone Encounter (Signed)
Spoke with patient after he had a myelogram here 09/20/16.  He reports he was "up and down all day" during his 24 hours of strict bedrest but denies having a headache.  jkl

## 2016-10-14 ENCOUNTER — Other Ambulatory Visit: Payer: Self-pay | Admitting: Family Medicine

## 2016-11-28 ENCOUNTER — Other Ambulatory Visit: Payer: Self-pay | Admitting: Family Medicine

## 2016-12-31 ENCOUNTER — Other Ambulatory Visit: Payer: Self-pay | Admitting: Family Medicine

## 2017-02-13 ENCOUNTER — Ambulatory Visit (INDEPENDENT_AMBULATORY_CARE_PROVIDER_SITE_OTHER): Payer: Managed Care, Other (non HMO) | Admitting: Family Medicine

## 2017-02-13 ENCOUNTER — Other Ambulatory Visit: Payer: Self-pay | Admitting: Family Medicine

## 2017-02-13 ENCOUNTER — Encounter: Payer: Self-pay | Admitting: Family Medicine

## 2017-02-13 VITALS — BP 150/96 | HR 70 | Temp 97.8°F | Resp 16 | Wt 214.0 lb

## 2017-02-13 DIAGNOSIS — F17200 Nicotine dependence, unspecified, uncomplicated: Secondary | ICD-10-CM | POA: Diagnosis not present

## 2017-02-13 DIAGNOSIS — Z125 Encounter for screening for malignant neoplasm of prostate: Secondary | ICD-10-CM | POA: Diagnosis not present

## 2017-02-13 DIAGNOSIS — E7800 Pure hypercholesterolemia, unspecified: Secondary | ICD-10-CM | POA: Diagnosis not present

## 2017-02-13 DIAGNOSIS — Z0000 Encounter for general adult medical examination without abnormal findings: Secondary | ICD-10-CM | POA: Diagnosis not present

## 2017-02-13 DIAGNOSIS — I10 Essential (primary) hypertension: Secondary | ICD-10-CM

## 2017-02-13 DIAGNOSIS — I2510 Atherosclerotic heart disease of native coronary artery without angina pectoris: Secondary | ICD-10-CM

## 2017-02-13 LAB — COMPLETE METABOLIC PANEL WITH GFR
ALBUMIN: 4.3 g/dL (ref 3.6–5.1)
ALK PHOS: 69 U/L (ref 40–115)
ALT: 25 U/L (ref 9–46)
AST: 17 U/L (ref 10–35)
BILIRUBIN TOTAL: 0.7 mg/dL (ref 0.2–1.2)
BUN: 14 mg/dL (ref 7–25)
CO2: 25 mmol/L (ref 20–31)
CREATININE: 0.97 mg/dL (ref 0.70–1.33)
Calcium: 9.1 mg/dL (ref 8.6–10.3)
Chloride: 102 mmol/L (ref 98–110)
GFR, EST NON AFRICAN AMERICAN: 86 mL/min (ref >=60)
GFR, Est African American: >89 mL/min (ref >=60)
GLUCOSE: 150 mg/dL — AB (ref 70–99)
Potassium: 4 mmol/L (ref 3.5–5.3)
SODIUM: 137 mmol/L (ref 135–146)
TOTAL PROTEIN: 6.7 g/dL (ref 6.1–8.1)

## 2017-02-13 LAB — CBC WITH DIFFERENTIAL/PLATELET
BASOS ABS: 98 {cells}/uL (ref 0–200)
Basophils Relative: 1 %
EOS PCT: 3 %
Eosinophils Absolute: 294 cells/uL (ref 15–500)
HCT: 48.7 % (ref 38.5–50.0)
HEMOGLOBIN: 17 g/dL (ref 13.0–17.0)
LYMPHS ABS: 2940 {cells}/uL (ref 850–3900)
Lymphocytes Relative: 30 %
MCH: 32.8 pg (ref 27.0–33.0)
MCHC: 34.9 g/dL (ref 32.0–36.0)
MCV: 93.8 fL (ref 80.0–100.0)
MPV: 9.8 fL (ref 7.5–12.5)
Monocytes Absolute: 784 cells/uL (ref 200–950)
Monocytes Relative: 8 %
NEUTROS ABS: 5684 {cells}/uL (ref 1500–7800)
Neutrophils Relative %: 58 %
PLATELETS: 161 10*3/uL (ref 140–400)
RBC: 5.19 MIL/uL (ref 4.20–5.80)
RDW: 13.6 % (ref 11.0–15.0)
WBC: 9.8 10*3/uL (ref 3.8–10.8)

## 2017-02-13 LAB — LIPID PANEL
Cholesterol: 144 mg/dL (ref <200)
HDL: 34 mg/dL — ABNORMAL LOW (ref >40)
LDL CALC: 65 mg/dL (ref <100)
Total CHOL/HDL Ratio: 4.2 Ratio (ref <5.0)
Triglycerides: 223 mg/dL — ABNORMAL HIGH (ref <150)
VLDL: 45 mg/dL — ABNORMAL HIGH (ref <30)

## 2017-02-13 LAB — PSA: PSA: 0.2 ng/mL (ref <=4.0)
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
## 2017-02-14 LAB — HEPATITIS C ANTIBODY: HCV AB: NEGATIVE

## 2017-02-16 ENCOUNTER — Encounter: Payer: Self-pay | Admitting: Family Medicine

## 2017-02-16 DIAGNOSIS — E119 Type 2 diabetes mellitus without complications: Secondary | ICD-10-CM | POA: Insufficient documentation

## 2017-02-16 DIAGNOSIS — Z794 Long term (current) use of insulin: Secondary | ICD-10-CM | POA: Insufficient documentation

## 2017-02-16 LAB — HEMOGLOBIN A1C
HEMOGLOBIN A1C: 6.3 % — AB (ref <5.7)
Mean Plasma Glucose: 134 mg/dL

## 2017-02-28 ENCOUNTER — Encounter: Payer: Self-pay | Admitting: Family Medicine

## 2017-04-12 ENCOUNTER — Other Ambulatory Visit: Payer: Self-pay | Admitting: Family Medicine

## 2017-04-14 ENCOUNTER — Emergency Department (HOSPITAL_COMMUNITY)
Admission: EM | Admit: 2017-04-14 | Discharge: 2017-04-14 | Disposition: A | Discharge status: Home / Self Care | Payer: 59 | Attending: Emergency Medicine | Admitting: Emergency Medicine

## 2017-04-14 ENCOUNTER — Encounter (HOSPITAL_COMMUNITY): Payer: Self-pay | Admitting: Emergency Medicine

## 2017-04-14 DIAGNOSIS — Z7982 Long term (current) use of aspirin: Secondary | ICD-10-CM | POA: Insufficient documentation

## 2017-04-14 DIAGNOSIS — X32XXXA Exposure to sunlight, initial encounter: Secondary | ICD-10-CM | POA: Diagnosis not present

## 2017-04-14 DIAGNOSIS — L568 Other specified acute skin changes due to ultraviolet radiation: Secondary | ICD-10-CM | POA: Diagnosis not present

## 2017-04-14 DIAGNOSIS — E119 Type 2 diabetes mellitus without complications: Secondary | ICD-10-CM | POA: Diagnosis not present

## 2017-04-14 DIAGNOSIS — Z79899 Other long term (current) drug therapy: Secondary | ICD-10-CM | POA: Insufficient documentation

## 2017-04-14 DIAGNOSIS — R21 Rash and other nonspecific skin eruption: Secondary | ICD-10-CM

## 2017-04-14 DIAGNOSIS — I2510 Atherosclerotic heart disease of native coronary artery without angina pectoris: Secondary | ICD-10-CM | POA: Diagnosis not present

## 2017-04-14 DIAGNOSIS — Z85828 Personal history of other malignant neoplasm of skin: Secondary | ICD-10-CM | POA: Diagnosis not present

## 2017-04-14 DIAGNOSIS — F17210 Nicotine dependence, cigarettes, uncomplicated: Secondary | ICD-10-CM | POA: Diagnosis not present

## 2017-04-14 NOTE — ED Notes (Signed)
ED Provider at bedside. 

## 2017-04-14 NOTE — ED Triage Notes (Signed)
Pt states he has an allergic reaction, is having itchiness, rash and burning sensation all over his body that started today after spray on his skin some Banana Boat sun block today, pt try to treat with [place] Allergy med, Cortizone 10, Clobetazol Propianate ointment 0.05% and Loterimin AF antifugal. Not SOB or distress noticed.

## 2017-04-14 NOTE — ED Notes (Signed)
Pt departed in NAD, refused use of wheelchair.  

## 2017-04-14 NOTE — Discharge Instructions (Signed)
Symptoms should improve in about 2 weeks. Use aloe vera or calamine lotion for symptomatic relief. Continue home medications as previously prescribed. Return to ED for worsening symptoms, trouble breathing, trouble swallowing, bleeding or discharge from areas, fever.
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
## 2017-06-04 ENCOUNTER — Ambulatory Visit: Payer: Managed Care, Other (non HMO) | Admitting: Family Medicine

## 2017-07-18 ENCOUNTER — Other Ambulatory Visit: Payer: Self-pay | Admitting: Family Medicine

## 2017-10-04 ENCOUNTER — Other Ambulatory Visit: Payer: Self-pay | Admitting: Family Medicine

## 2017-11-03 ENCOUNTER — Other Ambulatory Visit: Payer: Self-pay | Admitting: Family Medicine

## 2017-11-16 ENCOUNTER — Ambulatory Visit: Payer: BLUE CROSS/BLUE SHIELD | Admitting: Family Medicine

## 2017-11-16 ENCOUNTER — Other Ambulatory Visit: Payer: Self-pay | Admitting: Family Medicine

## 2017-11-16 ENCOUNTER — Encounter: Payer: Self-pay | Admitting: Family Medicine

## 2017-11-16 VITALS — BP 130/74 | HR 60 | Temp 97.8°F | Resp 18 | Ht 67.0 in | Wt 216.0 lb

## 2017-11-16 DIAGNOSIS — E7800 Pure hypercholesterolemia, unspecified: Secondary | ICD-10-CM | POA: Diagnosis not present

## 2017-11-16 DIAGNOSIS — I2510 Atherosclerotic heart disease of native coronary artery without angina pectoris: Secondary | ICD-10-CM | POA: Diagnosis not present

## 2017-11-16 DIAGNOSIS — L301 Dyshidrosis [pompholyx]: Secondary | ICD-10-CM | POA: Diagnosis not present

## 2017-11-16 DIAGNOSIS — I10 Essential (primary) hypertension: Secondary | ICD-10-CM

## 2017-11-16 DIAGNOSIS — R319 Hematuria, unspecified: Secondary | ICD-10-CM

## 2017-11-16 DIAGNOSIS — R7303 Prediabetes: Secondary | ICD-10-CM

## 2017-11-16 DIAGNOSIS — R300 Dysuria: Secondary | ICD-10-CM | POA: Diagnosis not present

## 2017-11-16 DIAGNOSIS — K4090 Unilateral inguinal hernia, without obstruction or gangrene, not specified as recurrent: Secondary | ICD-10-CM

## 2017-11-16 DIAGNOSIS — N5082 Scrotal pain: Secondary | ICD-10-CM | POA: Diagnosis not present

## 2017-11-16 MED ORDER — TRIAMCINOLONE ACETONIDE 0.1 % EX CREA: 1.0000 "application " | TOPICAL_CREAM | Freq: Two times a day (BID) | CUTANEOUS | 0 refills | Status: DC

## 2017-11-16 MED ORDER — PREDNISONE 20 MG PO TABS: ORAL_TABLET | ORAL | 0 refills | Status: DC
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
## 2017-11-17 LAB — COMPLETE METABOLIC PANEL WITH GFR
AG RATIO: 1.6 (calc) (ref 1.0–2.5)
ALKALINE PHOSPHATASE (APISO): 69 U/L (ref 40–115)
ALT: 32 U/L (ref 9–46)
AST: 29 U/L (ref 10–35)
Albumin: 4.1 g/dL (ref 3.6–5.1)
BILIRUBIN TOTAL: 0.6 mg/dL (ref 0.2–1.2)
BUN: 8 mg/dL (ref 7–25)
CALCIUM: 8.8 mg/dL (ref 8.6–10.3)
CHLORIDE: 102 mmol/L (ref 98–110)
CO2: 25 mmol/L (ref 20–32)
Creat: 0.82 mg/dL (ref 0.70–1.33)
GFR, Est African American: 113 mL/min/{1.73_m2} (ref >=60)
GFR, Est Non African American: 97 mL/min/{1.73_m2} (ref >=60)
GLOBULIN: 2.5 g/dL (ref 1.9–3.7)
Glucose, Bld: 132 mg/dL — ABNORMAL HIGH (ref 65–99)
POTASSIUM: 3.9 mmol/L (ref 3.5–5.3)
SODIUM: 136 mmol/L (ref 135–146)
Total Protein: 6.6 g/dL (ref 6.1–8.1)

## 2017-11-17 LAB — CBC WITH DIFFERENTIAL/PLATELET
BASOS ABS: 80 {cells}/uL (ref 0–200)
Basophils Relative: 0.9 %
Eosinophils Absolute: 409 cells/uL (ref 15–500)
Eosinophils Relative: 4.6 %
HCT: 47.8 % (ref 38.5–50.0)
Hemoglobin: 16.2 g/dL (ref 13.2–17.1)
Lymphs Abs: 2492 cells/uL (ref 850–3900)
MCH: 32 pg (ref 27.0–33.0)
MCHC: 33.9 g/dL (ref 32.0–36.0)
MCV: 94.3 fL (ref 80.0–100.0)
MPV: 10.9 fL (ref 7.5–12.5)
Monocytes Relative: 10.3 %
NEUTROS PCT: 56.2 %
Neutro Abs: 5002 cells/uL (ref 1500–7800)
Platelets: 179 10*3/uL (ref 140–400)
RBC: 5.07 10*6/uL (ref 4.20–5.80)
RDW: 12.9 % (ref 11.0–15.0)
TOTAL LYMPHOCYTE: 28 %
WBC: 8.9 10*3/uL (ref 3.8–10.8)
WBCMIX: 917 {cells}/uL (ref 200–950)

## 2017-11-17 LAB — URINALYSIS, ROUTINE W REFLEX MICROSCOPIC
BACTERIA UA: NONE SEEN /HPF
Bilirubin Urine: NEGATIVE
Glucose, UA: NEGATIVE
Ketones, ur: NEGATIVE
Leukocytes, UA: NEGATIVE
Nitrite: NEGATIVE
PROTEIN: NEGATIVE
RBC / HPF: NONE SEEN /HPF (ref 0–2)
SQUAMOUS EPITHELIAL / LPF: NONE SEEN /HPF (ref <=5)
Specific Gravity, Urine: 1.003 (ref 1.001–1.03)
WBC UA: NONE SEEN /HPF (ref 0–5)
pH: 6.5 (ref 5.0–8.0)

## 2017-11-17 LAB — HEMOGLOBIN A1C
EAG (MMOL/L): 8.1 (calc)
Hgb A1c MFr Bld: 6.7 % of total Hgb — ABNORMAL HIGH (ref <5.7)
Mean Plasma Glucose: 146 (calc)

## 2017-11-17 LAB — LIPID PANEL
CHOLESTEROL: 128 mg/dL (ref <200)
HDL: 35 mg/dL — ABNORMAL LOW (ref >40)
LDL Cholesterol (Calc): 70 mg/dL (calc)
Non-HDL Cholesterol (Calc): 93 mg/dL (calc) (ref <130)
Total CHOL/HDL Ratio: 3.7 (calc) (ref <5.0)
Triglycerides: 151 mg/dL — ABNORMAL HIGH (ref <150)

## 2017-11-17 LAB — MICROSCOPIC MESSAGE

## 2017-11-19 ENCOUNTER — Encounter: Payer: Self-pay | Admitting: Family Medicine

## 2017-11-23 ENCOUNTER — Other Ambulatory Visit: Payer: Self-pay | Admitting: Family Medicine

## 2017-11-23 ENCOUNTER — Ambulatory Visit
Admission: RE | Admit: 2017-11-23 | Discharge: 2017-11-23 | Disposition: A | Discharge status: Home / Self Care | Payer: 59 | Source: Ambulatory Visit | Attending: Family Medicine | Admitting: Family Medicine

## 2017-11-23 DIAGNOSIS — K4090 Unilateral inguinal hernia, without obstruction or gangrene, not specified as recurrent: Secondary | ICD-10-CM

## 2017-11-23 DIAGNOSIS — N5082 Scrotal pain: Secondary | ICD-10-CM

## 2017-11-28 ENCOUNTER — Other Ambulatory Visit: Payer: Self-pay | Admitting: Family Medicine

## 2017-11-28 DIAGNOSIS — N433 Hydrocele, unspecified: Secondary | ICD-10-CM

## 2017-12-17 ENCOUNTER — Other Ambulatory Visit: Payer: Self-pay | Admitting: Family Medicine

## 2017-12-17 ENCOUNTER — Telehealth: Payer: Self-pay | Admitting: Family Medicine

## 2017-12-17 DIAGNOSIS — R21 Rash and other nonspecific skin eruption: Secondary | ICD-10-CM

## 2017-12-17 NOTE — Telephone Encounter (Signed)
pts wife called stating that he was give prednisone for a rash and was told that if it did not get better to call back and we would do a referral to dermatology.

## 2017-12-17 NOTE — Telephone Encounter (Signed)
Please consult dermatology

## 2017-12-18 NOTE — Telephone Encounter (Signed)
Referral placed.

## 2017-12-31 ENCOUNTER — Encounter: Payer: Self-pay | Admitting: Family Medicine

## 2018-02-14 ENCOUNTER — Other Ambulatory Visit: Payer: Self-pay | Admitting: Family Medicine

## 2018-02-27 ENCOUNTER — Other Ambulatory Visit: Payer: Self-pay | Admitting: Family Medicine
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
## 2018-04-10 ENCOUNTER — Ambulatory Visit: Payer: BLUE CROSS/BLUE SHIELD | Admitting: Cardiology

## 2018-04-10 ENCOUNTER — Encounter: Payer: Self-pay | Admitting: Cardiology

## 2018-04-10 VITALS — BP 138/82 | HR 68 | Ht 67.5 in | Wt 213.8 lb

## 2018-04-10 DIAGNOSIS — Z720 Tobacco use: Secondary | ICD-10-CM | POA: Diagnosis not present

## 2018-04-10 DIAGNOSIS — I10 Essential (primary) hypertension: Secondary | ICD-10-CM | POA: Diagnosis not present

## 2018-04-10 DIAGNOSIS — I2510 Atherosclerotic heart disease of native coronary artery without angina pectoris: Secondary | ICD-10-CM

## 2018-04-10 DIAGNOSIS — E7800 Pure hypercholesterolemia, unspecified: Secondary | ICD-10-CM

## 2018-04-10 MED ORDER — BUPROPION HCL ER (SR) 150 MG PO TB12: 150.0000 mg | ORAL_TABLET | Freq: Two times a day (BID) | ORAL | 3 refills | Status: DC

## 2018-04-10 MED ORDER — NITROGLYCERIN 0.4 MG SL SUBL: 0.4000 mg | SUBLINGUAL_TABLET | SUBLINGUAL | 6 refills | Status: DC | PRN

## 2018-04-10 NOTE — Patient Instructions (Addendum)
We will try Wellbutrin for smoking cessation. Start 150 mg daily for three days then 150 mg twice a day.  Continue your other therapy  You are clear for knee surgery  I will see you in one year
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
## 2018-04-22 NOTE — Progress Notes (Signed)
LOV/CARDIAC CLEARANCE DR.PETERJORDAN 04-10-18   Epic   EKG 04-10-18 Epic

## 2018-04-22 NOTE — Patient Instructions (Addendum)
Todd [name]  04/22/2018   Your procedure is scheduled on: 05-01-18   Report to [facility] Main  Entrance    Report to admitting at 10:00AM    Call this number if you have problems the morning of surgery [phone]     Remember: Do not eat food or drink liquids :After Midnight.     Take these medicines the morning of surgery with A SIP OF WATER: WELLBUTRIN, CARVEDILOL, FAMOTIDINE IF NEEDED                                You may not have any metal on your body including hair pins and              piercings  Do not wear jewelry, make-up, lotions, powders or perfumes, deodorant             Do not wear nail polish.  Do not shave  48 hours prior to surgery.     Do not bring valuables to the hospital. [place].  Contacts, dentures or bridgework may not be worn into surgery.  Leave suitcase in the car. After surgery it may be brought to your room.                 Please read over the following fact sheets you were given: _____________________________________________________________________             How to Manage Your Diabetes Before and After Surgery  Why is it important to control my blood sugar before and after surgery? . Improving blood sugar levels before and after surgery helps healing and can limit problems. . A way of improving blood sugar control is eating a healthy diet by: o  Eating less sugar and carbohydrates o  Increasing activity/exercise o  Talking with your doctor about reaching your blood sugar goals . High blood sugars (greater than 180 mg/dL) can raise your risk of infections and slow your recovery, so you will need to focus on controlling your diabetes during the weeks before surgery. . Make sure that the doctor who takes care of your diabetes knows about your planned surgery including the date and location.   Patient Signature:  Date:   Nurse Signature:  Date:   Reviewed  and Endorsed by [facility] Patient Education Committee, August 2015    [facility] - Preparing for Surgery Before surgery, you can play an important role.  Because skin is not sterile, your skin needs to be as free of germs as possible.  You can reduce the number of germs on your skin by washing with CHG (chlorahexidine gluconate) soap before surgery.  CHG is an antiseptic cleaner which kills germs and bonds with the skin to continue killing germs even after washing. Please DO NOT use if you have an allergy to CHG or antibacterial soaps.  If your skin becomes reddened/irritated stop using the CHG and inform your nurse when you arrive at Short Stay. Do not shave (including legs and underarms) for at least 48 hours prior to the first CHG shower.  You may shave your face/neck. Please follow these instructions carefully:  1.  Shower with CHG Soap the night before surgery and the  morning of Surgery.  2.  If you choose to wash your hair, wash your hair first as usual with your  normal  shampoo.  3.  After you shampoo, rinse your hair and body thoroughly to remove the  shampoo.                           4.  Use CHG as you would any other liquid soap.  You can apply chg directly  to the skin and wash                       Gently with a scrungie or clean washcloth.  5.  Apply the CHG Soap to your body ONLY FROM THE NECK DOWN.   Do not use on face/ open                           Wound or open sores. Avoid contact with eyes, ears mouth and genitals (private parts).                       Wash face,  Genitals (private parts) with your normal soap.             6.  Wash thoroughly, paying special attention to the area where your surgery  will be performed.  7.  Thoroughly rinse your body with warm water from the neck down.  8.  DO NOT shower/wash with your normal soap after using and rinsing off  the CHG Soap.                9.  Pat yourself dry with a clean towel.            10.  Wear clean pajamas.             11.  Place clean sheets on your bed the night of your first shower and do not  sleep with pets. Day of Surgery : Do not apply any lotions/deodorants the morning of surgery.  Please wear clean clothes to the hospital/surgery center.  FAILURE TO FOLLOW THESE INSTRUCTIONS MAY RESULT IN THE CANCELLATION OF YOUR SURGERY PATIENT SIGNATURE_________________________________  NURSE SIGNATURE__________________________________  ________________________________________________________________________   Todd [name]  An incentive spirometer is a tool that can help keep your lungs clear and active. This tool measures how well you are filling your lungs with each breath. Taking long deep breaths may help reverse or decrease the chance of developing breathing (pulmonary) problems (especially infection) following:  A long period of time when you are unable to move or be active. BEFORE THE PROCEDURE   If the spirometer includes an indicator to show your best effort, your nurse or respiratory therapist will set it to a desired goal.  If possible, sit up straight or lean slightly forward. Try not to slouch.  Hold the incentive spirometer in an upright position. INSTRUCTIONS FOR USE  1. Sit on the edge of your bed if possible, or sit up as far as you can in bed or on a chair. 2. Hold the incentive spirometer in an upright position. 3. Breathe out normally. 4. Place the mouthpiece in your mouth and seal your lips tightly around it. 5. Breathe in slowly and as deeply as possible, raising the piston or the ball toward the top of the column. 6. Hold your breath for 3-5 seconds or for as long as possible. Allow the piston or  ball to fall to the bottom of the column. 7. Remove the mouthpiece from your mouth and breathe out normally. 8. Rest for a few seconds and repeat Steps 1 through 7 at least 10 times every 1-2 hours when you are awake. Take your time and take a few normal breaths between deep  breaths. 9. The spirometer may include an indicator to show your best effort. Use the indicator as a goal to work toward during each repetition. 10. After each set of 10 deep breaths, practice coughing to be sure your lungs are clear. If you have an incision (the cut made at the time of surgery), support your incision when coughing by placing a pillow or rolled up towels firmly against it. Once you are able to get out of bed, walk around indoors and cough well. You may stop using the incentive spirometer when instructed by your caregiver.  RISKS AND COMPLICATIONS  Take your time so you do not get dizzy or light-headed.  If you are in pain, you may need to take or ask for pain medication before doing incentive spirometry. It is harder to take a deep breath if you are having pain. AFTER USE  Rest and breathe slowly and easily.  It can be helpful to keep track of a log of your progress. Your caregiver can provide you with a simple table to help with this. If you are using the spirometer at home, follow these instructions: [place] IF:   You are having difficultly using the spirometer.  You have trouble using the spirometer as often as instructed.  Your pain medication is not giving enough relief while using the spirometer.  You develop fever of 100.5 F (38.1 C) or higher. SEEK IMMEDIATE MEDICAL CARE IF:   You cough up bloody sputum that had not been present before.  You develop fever of 102 F (38.9 C) or greater.  You develop worsening pain at or near the incision site. MAKE SURE YOU:   Understand these instructions.  Will watch your condition.  Will get help right away if you are not doing well or get worse. Document Released: 03/05/2007 Document Revised: 01/15/2012 Document Reviewed: 05/06/2007 ExitCare Patient Information 2014 ExitCare, [place].   ________________________________________________________________________  WHAT IS A BLOOD TRANSFUSION? Blood  Transfusion Information  A transfusion is the replacement of blood or some of its parts. Blood is made up of multiple cells which provide different functions.  Red blood cells carry oxygen and are used for blood loss replacement.  White blood cells fight against infection.  Platelets control bleeding.  Plasma helps clot blood.  Other blood products are available for specialized needs, such as hemophilia or other clotting disorders. BEFORE THE TRANSFUSION  Who gives blood for transfusions?   Healthy volunteers who are fully evaluated to make sure their blood is safe. This is blood bank blood. Transfusion therapy is the safest it has ever been in the practice of medicine. Before blood is taken from a donor, a complete history is taken to make sure that person has no history of diseases nor engages in risky social behavior (examples are intravenous drug use or sexual activity with multiple partners). The donor's travel history is screened to minimize risk of transmitting infections, such as malaria. The donated blood is tested for signs of infectious diseases, such as HIV and hepatitis. The blood is then tested to be sure it is compatible with you in order to minimize the chance of a transfusion reaction. If you or  a relative donates blood, this is often done in anticipation of surgery and is not appropriate for emergency situations. It takes many days to process the donated blood. RISKS AND COMPLICATIONS Although transfusion therapy is very safe and saves many lives, the main dangers of transfusion include:   Getting an infectious disease.  Developing a transfusion reaction. This is an allergic reaction to something in the blood you were given. Every precaution is taken to prevent this. The decision to have a blood transfusion has been considered carefully by your caregiver before blood is given. Blood is not given unless the benefits outweigh the risks. AFTER THE TRANSFUSION  Right after  receiving a blood transfusion, you will usually feel much better and more energetic. This is especially true if your red blood cells have gotten low (anemic). The transfusion raises the level of the red blood cells which carry oxygen, and this usually causes an energy increase.  The nurse administering the transfusion will monitor you carefully for complications. HOME CARE INSTRUCTIONS  No special instructions are needed after a transfusion. You may find your energy is better. Speak with your caregiver about any limitations on activity for underlying diseases you may have. SEEK MEDICAL CARE IF:   Your condition is not improving after your transfusion.  You develop redness or irritation at the intravenous (IV) site. SEEK IMMEDIATE MEDICAL CARE IF:  Any of the following symptoms occur over the next 12 hours:  Shaking chills.  You have a temperature by mouth above 102 F (38.9 C), not controlled by medicine.  Chest, back, or muscle pain.  People around you feel you are not acting correctly or are confused.  Shortness of breath or difficulty breathing.  Dizziness and fainting.  You get a rash or develop hives.  You have a decrease in urine output.  Your urine turns a dark color or changes to pink, red, or brown. Any of the following symptoms occur over the next 10 days:  You have a temperature by mouth above 102 F (38.9 C), not controlled by medicine.  Shortness of breath.  Weakness after normal activity.  The white part of the eye turns yellow (jaundice).  You have a decrease in the amount of urine or are urinating less often.  Your urine turns a dark color or changes to pink, red, or brown. Document Released: 10/20/2000 Document Revised: 01/15/2012 Document Reviewed: 06/08/2008 [facility] Patient Information 2014 [place], [place].  _______________________________________________________________________

## 2018-04-24 ENCOUNTER — Encounter (HOSPITAL_COMMUNITY): Payer: Self-pay

## 2018-04-24 ENCOUNTER — Encounter (HOSPITAL_COMMUNITY)
Admission: RE | Admit: 2018-04-24 | Discharge: 2018-04-24 | Disposition: A | Discharge status: Home / Self Care | Payer: BLUE CROSS/BLUE SHIELD | Source: Ambulatory Visit | Attending: Orthopedic Surgery | Admitting: Orthopedic Surgery

## 2018-04-24 ENCOUNTER — Other Ambulatory Visit: Payer: Self-pay

## 2018-04-24 DIAGNOSIS — M1711 Unilateral primary osteoarthritis, right knee: Secondary | ICD-10-CM | POA: Diagnosis not present

## 2018-04-24 DIAGNOSIS — Z01812 Encounter for preprocedural laboratory examination: Secondary | ICD-10-CM | POA: Insufficient documentation

## 2018-04-24 HISTORY — DX: Disorder of the skin and subcutaneous tissue, unspecified: L98.9

## 2018-04-24 LAB — SURGICAL PCR SCREEN
MRSA, PCR: NEGATIVE
STAPHYLOCOCCUS AUREUS: POSITIVE — AB

## 2018-04-24 LAB — CBC
HCT: 47.5 % (ref 39.0–52.0)
Hemoglobin: 16.7 g/dL (ref 13.0–17.0)
MCH: 33.8 pg (ref 26.0–34.0)
MCHC: 35.2 g/dL (ref 30.0–36.0)
MCV: 96.2 fL (ref 78.0–100.0)
Platelets: 155 10*3/uL (ref 150–400)
RBC: 4.94 MIL/uL (ref 4.22–5.81)
RDW: 13.1 % (ref 11.5–15.5)
WBC: 8.8 10*3/uL (ref 4.0–10.5)

## 2018-04-24 LAB — PROTIME-INR
INR: 0.96
Prothrombin Time: 12.7 seconds (ref 11.4–15.2)

## 2018-04-24 LAB — COMPREHENSIVE METABOLIC PANEL
ALT: 33 U/L (ref 17–63)
AST: 26 U/L (ref 15–41)
Albumin: 4.2 g/dL (ref 3.5–5.0)
Alkaline Phosphatase: 62 U/L (ref 38–126)
Anion gap: 9 (ref 5–15)
BUN: 8 mg/dL (ref 6–20)
CO2: 26 mmol/L (ref 22–32)
Calcium: 9 mg/dL (ref 8.9–10.3)
Chloride: 103 mmol/L (ref 101–111)
Creatinine, Ser: 0.82 mg/dL (ref 0.61–1.24)
GFR calc Af Amer: >60 mL/min (ref >60)
GFR calc non Af Amer: >60 mL/min (ref >60)
Glucose, Bld: 119 mg/dL — ABNORMAL HIGH (ref 65–99)
Potassium: 3.9 mmol/L (ref 3.5–5.1)
Sodium: 138 mmol/L (ref 135–145)
Total Bilirubin: 1.3 mg/dL — ABNORMAL HIGH (ref 0.3–1.2)
Total Protein: 7.1 g/dL (ref 6.5–8.1)

## 2018-04-24 LAB — HEMOGLOBIN A1C
Hgb A1c MFr Bld: 7 % — ABNORMAL HIGH (ref 4.8–5.6)
Mean Plasma Glucose: 154.2 mg/dL

## 2018-04-24 LAB — APTT: aPTT: 28 seconds (ref 24–36)

## 2018-04-30 MED ORDER — BUPIVACAINE LIPOSOME 1.3 % IJ SUSP
20.0000 mL | Freq: Once | INTRAMUSCULAR | Status: AC
  Filled 2018-04-30: qty 20

## 2018-04-30 MED ORDER — TRANEXAMIC ACID 1000 MG/10ML IV SOLN
2000.0000 mg | INTRAVENOUS | Status: AC
  Filled 2018-04-30: qty 20

## 2018-05-01 ENCOUNTER — Encounter (HOSPITAL_COMMUNITY): Payer: Self-pay | Admitting: Anesthesiology

## 2018-05-01 ENCOUNTER — Inpatient Hospital Stay (HOSPITAL_COMMUNITY): Payer: BLUE CROSS/BLUE SHIELD | Admitting: Anesthesiology

## 2018-05-01 ENCOUNTER — Other Ambulatory Visit: Payer: Self-pay

## 2018-05-01 ENCOUNTER — Observation Stay (HOSPITAL_COMMUNITY)
Admission: RE | Admit: 2018-05-01 | Discharge: 2018-05-03 | DRG: 470 | Disposition: A | Discharge status: Home / Self Care | Payer: BLUE CROSS/BLUE SHIELD | Source: Ambulatory Visit | Attending: Orthopedic Surgery | Admitting: Orthopedic Surgery

## 2018-05-01 ENCOUNTER — Encounter (HOSPITAL_COMMUNITY)
Admission: RE | Disposition: A | Discharge status: Home / Self Care | Payer: Self-pay | Source: Ambulatory Visit | Attending: Orthopedic Surgery

## 2018-05-01 DIAGNOSIS — Z7902 Long term (current) use of antithrombotics/antiplatelets: Secondary | ICD-10-CM | POA: Diagnosis not present

## 2018-05-01 DIAGNOSIS — E669 Obesity, unspecified: Secondary | ICD-10-CM | POA: Diagnosis not present

## 2018-05-01 DIAGNOSIS — Z886 Allergy status to analgesic agent status: Secondary | ICD-10-CM | POA: Diagnosis not present

## 2018-05-01 DIAGNOSIS — I2510 Atherosclerotic heart disease of native coronary artery without angina pectoris: Secondary | ICD-10-CM | POA: Insufficient documentation

## 2018-05-01 DIAGNOSIS — E785 Hyperlipidemia, unspecified: Secondary | ICD-10-CM | POA: Diagnosis not present

## 2018-05-01 DIAGNOSIS — Z91040 Latex allergy status: Secondary | ICD-10-CM | POA: Insufficient documentation

## 2018-05-01 DIAGNOSIS — I119 Hypertensive heart disease without heart failure: Secondary | ICD-10-CM | POA: Insufficient documentation

## 2018-05-01 DIAGNOSIS — Z85828 Personal history of other malignant neoplasm of skin: Secondary | ICD-10-CM | POA: Insufficient documentation

## 2018-05-01 DIAGNOSIS — K920 Hematemesis: Secondary | ICD-10-CM | POA: Insufficient documentation

## 2018-05-01 DIAGNOSIS — R066 Hiccough: Secondary | ICD-10-CM | POA: Diagnosis not present

## 2018-05-01 DIAGNOSIS — M24561 Contracture, right knee: Secondary | ICD-10-CM | POA: Diagnosis not present

## 2018-05-01 DIAGNOSIS — F17210 Nicotine dependence, cigarettes, uncomplicated: Secondary | ICD-10-CM | POA: Diagnosis not present

## 2018-05-01 DIAGNOSIS — Z8711 Personal history of peptic ulcer disease: Secondary | ICD-10-CM | POA: Diagnosis not present

## 2018-05-01 DIAGNOSIS — Z79899 Other long term (current) drug therapy: Secondary | ICD-10-CM | POA: Insufficient documentation

## 2018-05-01 DIAGNOSIS — I252 Old myocardial infarction: Secondary | ICD-10-CM | POA: Insufficient documentation

## 2018-05-01 DIAGNOSIS — E119 Type 2 diabetes mellitus without complications: Secondary | ICD-10-CM | POA: Diagnosis not present

## 2018-05-01 DIAGNOSIS — Z01818 Encounter for other preprocedural examination: Secondary | ICD-10-CM | POA: Diagnosis not present

## 2018-05-01 DIAGNOSIS — Z7982 Long term (current) use of aspirin: Secondary | ICD-10-CM | POA: Diagnosis not present

## 2018-05-01 DIAGNOSIS — Z6833 Body mass index (BMI) 33.0-33.9, adult: Secondary | ICD-10-CM | POA: Insufficient documentation

## 2018-05-01 DIAGNOSIS — M1711 Unilateral primary osteoarthritis, right knee: Principal | ICD-10-CM | POA: Insufficient documentation

## 2018-05-01 DIAGNOSIS — Z955 Presence of coronary angioplasty implant and graft: Secondary | ICD-10-CM | POA: Insufficient documentation

## 2018-05-01 DIAGNOSIS — Z96651 Presence of right artificial knee joint: Secondary | ICD-10-CM

## 2018-05-01 HISTORY — PX: TOTAL KNEE ARTHROPLASTY: SHX125

## 2018-05-01 LAB — GLUCOSE, CAPILLARY
GLUCOSE-CAPILLARY: 136 mg/dL — AB (ref 70–99)
GLUCOSE-CAPILLARY: 195 mg/dL — AB (ref 70–99)
Glucose-Capillary: 264 mg/dL — ABNORMAL HIGH (ref 70–99)

## 2018-05-01 LAB — HEMOGLOBIN A1C
Hgb A1c MFr Bld: 6.9 % — ABNORMAL HIGH (ref 4.8–5.6)
Mean Plasma Glucose: 151.33 mg/dL

## 2018-05-01 LAB — TYPE AND SCREEN
ABO/RH(D): A NEG
Antibody Screen: NEGATIVE

## 2018-05-01 SURGERY — ARTHROPLASTY, KNEE, TOTAL
Anesthesia: General | Site: Knee | Laterality: Right

## 2018-05-01 MED ORDER — MENTHOL 3 MG MT LOZG: 1.0000 | LOZENGE | OROMUCOSAL | Status: DC | PRN

## 2018-05-01 MED ORDER — CARVEDILOL 12.5 MG PO TABS
12.5000 mg | ORAL_TABLET | Freq: Two times a day (BID) | ORAL | Status: DC
  Administered 2018-05-01 – 2018-05-03 (×4): 12.5 mg via ORAL
  Filled 2018-05-01 (×4): qty 1

## 2018-05-01 MED ORDER — ACETAMINOPHEN 160 MG/5ML PO SOLN: 325.0000 mg | ORAL | Status: DC | PRN

## 2018-05-01 MED ORDER — SODIUM CHLORIDE 0.9 % IV SOLN
INTRAVENOUS | Status: DC | PRN
  Administered 2018-05-01: 500 mL

## 2018-05-01 MED ORDER — SODIUM CHLORIDE 0.9 % IV SOLN
INTRAVENOUS | Status: AC
  Filled 2018-05-01: qty 500000

## 2018-05-01 MED ORDER — FENTANYL CITRATE (PF) 100 MCG/2ML IJ SOLN
INTRAMUSCULAR | Status: DC | PRN
  Administered 2018-05-01: 25 ug via INTRAVENOUS
  Administered 2018-05-01 (×2): 50 ug via INTRAVENOUS
  Administered 2018-05-01: 25 ug via INTRAVENOUS
  Administered 2018-05-01: 50 ug via INTRAVENOUS

## 2018-05-01 MED ORDER — SUCCINYLCHOLINE CHLORIDE 200 MG/10ML IV SOSY
PREFILLED_SYRINGE | INTRAVENOUS | Status: AC
Start: 2018-05-01 — End: ?
  Filled 2018-05-01: qty 10

## 2018-05-01 MED ORDER — OXYCODONE HCL 5 MG PO TABS
10.0000 mg | ORAL_TABLET | ORAL | Status: DC | PRN
  Administered 2018-05-01 (×2): 10 mg via ORAL
  Filled 2018-05-01 (×2): qty 2

## 2018-05-01 MED ORDER — BUPIVACAINE LIPOSOME 1.3 % IJ SUSP
INTRAMUSCULAR | Status: DC | PRN
  Administered 2018-05-01: 20 mL

## 2018-05-01 MED ORDER — MEPERIDINE HCL 50 MG/ML IJ SOLN: 6.2500 mg | INTRAMUSCULAR | Status: DC | PRN

## 2018-05-01 MED ORDER — NITROGLYCERIN 0.4 MG SL SUBL: 0.4000 mg | SUBLINGUAL_TABLET | SUBLINGUAL | Status: DC | PRN

## 2018-05-01 MED ORDER — PHENOL 1.4 % MT LIQD
1.0000 | OROMUCOSAL | Status: DC | PRN
  Filled 2018-05-01: qty 177

## 2018-05-01 MED ORDER — SUGAMMADEX SODIUM 200 MG/2ML IV SOLN
INTRAVENOUS | Status: DC | PRN
  Administered 2018-05-01: 200 mg via INTRAVENOUS

## 2018-05-01 MED ORDER — METOCLOPRAMIDE HCL 5 MG/ML IJ SOLN
5.0000 mg | Freq: Three times a day (TID) | INTRAMUSCULAR | Status: DC | PRN
  Administered 2018-05-01 – 2018-05-02 (×2): 10 mg via INTRAVENOUS
  Filled 2018-05-01 (×2): qty 2

## 2018-05-01 MED ORDER — LACTATED RINGERS IV SOLN
INTRAVENOUS | Status: DC
  Administered 2018-05-01 (×2): via INTRAVENOUS

## 2018-05-01 MED ORDER — INSULIN ASPART 100 UNIT/ML ~~LOC~~ SOLN
0.0000 [IU] | Freq: Three times a day (TID) | SUBCUTANEOUS | Status: DC
  Administered 2018-05-02 (×2): 3 [IU] via SUBCUTANEOUS
  Administered 2018-05-02: 8 [IU] via SUBCUTANEOUS
  Administered 2018-05-03: 2 [IU] via SUBCUTANEOUS

## 2018-05-01 MED ORDER — ONDANSETRON HCL 4 MG/2ML IJ SOLN
INTRAMUSCULAR | Status: DC | PRN
  Administered 2018-05-01: 4 mg via INTRAVENOUS

## 2018-05-01 MED ORDER — RIVAROXABAN 10 MG PO TABS
10.0000 mg | ORAL_TABLET | Freq: Every day | ORAL | Status: DC
  Administered 2018-05-02 – 2018-05-03 (×2): 10 mg via ORAL
  Filled 2018-05-01 (×2): qty 1

## 2018-05-01 MED ORDER — FENTANYL CITRATE (PF) 100 MCG/2ML IJ SOLN
INTRAMUSCULAR | Status: AC
Start: 2018-05-01 — End: ?
  Filled 2018-05-01: qty 2

## 2018-05-01 MED ORDER — STERILE WATER FOR IRRIGATION IR SOLN
Status: DC | PRN
  Administered 2018-05-01: 2000 mL

## 2018-05-01 MED ORDER — ACETAMINOPHEN 500 MG PO TABS
1000.0000 mg | ORAL_TABLET | Freq: Four times a day (QID) | ORAL | Status: AC
  Administered 2018-05-01 – 2018-05-02 (×3): 1000 mg via ORAL
  Filled 2018-05-01 (×3): qty 2

## 2018-05-01 MED ORDER — CHLORHEXIDINE GLUCONATE 4 % EX LIQD: 60.0000 mL | Freq: Once | CUTANEOUS | Status: DC

## 2018-05-01 MED ORDER — LACTATED RINGERS IV SOLN: INTRAVENOUS | Status: DC

## 2018-05-01 MED ORDER — PROPOFOL 10 MG/ML IV BOLUS
INTRAVENOUS | Status: AC
  Filled 2018-05-01: qty 20

## 2018-05-01 MED ORDER — MIDAZOLAM HCL 2 MG/2ML IJ SOLN
1.0000 mg | INTRAMUSCULAR | Status: AC
  Administered 2018-05-01 (×2): 2 mg via INTRAVENOUS
  Filled 2018-05-01 (×2): qty 2

## 2018-05-01 MED ORDER — VANCOMYCIN HCL IN DEXTROSE 1-5 GM/200ML-% IV SOLN
1000.0000 mg | INTRAVENOUS | Status: AC
  Administered 2018-05-01: 1000 mg via INTRAVENOUS
  Filled 2018-05-01: qty 200

## 2018-05-01 MED ORDER — FENTANYL CITRATE (PF) 100 MCG/2ML IJ SOLN: 25.0000 ug | INTRAMUSCULAR | Status: DC | PRN

## 2018-05-01 MED ORDER — LOSARTAN POTASSIUM 50 MG PO TABS
50.0000 mg | ORAL_TABLET | Freq: Every day | ORAL | Status: DC
  Administered 2018-05-02 – 2018-05-03 (×2): 50 mg via ORAL
  Filled 2018-05-01 (×3): qty 1

## 2018-05-01 MED ORDER — VANCOMYCIN HCL IN DEXTROSE 1-5 GM/200ML-% IV SOLN
1000.0000 mg | Freq: Two times a day (BID) | INTRAVENOUS | Status: AC
  Administered 2018-05-02: 1000 mg via INTRAVENOUS
  Filled 2018-05-01: qty 200

## 2018-05-01 MED ORDER — LIDOCAINE 2% (20 MG/ML) 5 ML SYRINGE
INTRAMUSCULAR | Status: DC | PRN
  Administered 2018-05-01: 50 mg via INTRAVENOUS

## 2018-05-01 MED ORDER — FLEET ENEMA 7-19 GM/118ML RE ENEM: 1.0000 | ENEMA | Freq: Once | RECTAL | Status: DC | PRN

## 2018-05-01 MED ORDER — SUGAMMADEX SODIUM 200 MG/2ML IV SOLN
INTRAVENOUS | Status: AC
  Filled 2018-05-01: qty 2

## 2018-05-01 MED ORDER — ACETAMINOPHEN 325 MG PO TABS: 325.0000 mg | ORAL_TABLET | ORAL | Status: DC | PRN

## 2018-05-01 MED ORDER — SODIUM CHLORIDE 0.9 % IJ SOLN
INTRAMUSCULAR | Status: AC
  Filled 2018-05-01: qty 50

## 2018-05-01 MED ORDER — BUPROPION HCL ER (SR) 150 MG PO TB12
150.0000 mg | ORAL_TABLET | Freq: Two times a day (BID) | ORAL | Status: DC
  Administered 2018-05-01 – 2018-05-03 (×4): 150 mg via ORAL
  Filled 2018-05-01 (×4): qty 1

## 2018-05-01 MED ORDER — FENTANYL CITRATE (PF) 100 MCG/2ML IJ SOLN
INTRAMUSCULAR | Status: AC
  Filled 2018-05-01: qty 4

## 2018-05-01 MED ORDER — ONDANSETRON HCL 4 MG PO TABS: 4.0000 mg | ORAL_TABLET | Freq: Four times a day (QID) | ORAL | Status: DC | PRN

## 2018-05-01 MED ORDER — ACETAMINOPHEN 325 MG PO TABS
325.0000 mg | ORAL_TABLET | Freq: Four times a day (QID) | ORAL | Status: DC | PRN
  Administered 2018-05-02: 650 mg via ORAL
  Filled 2018-05-01: qty 2

## 2018-05-01 MED ORDER — ONDANSETRON HCL 4 MG/2ML IJ SOLN: 4.0000 mg | Freq: Once | INTRAMUSCULAR | Status: DC | PRN

## 2018-05-01 MED ORDER — FENTANYL CITRATE (PF) 100 MCG/2ML IJ SOLN
INTRAMUSCULAR | Status: AC
  Filled 2018-05-01: qty 2

## 2018-05-01 MED ORDER — ATORVASTATIN CALCIUM 40 MG PO TABS
80.0000 mg | ORAL_TABLET | Freq: Every evening | ORAL | Status: DC
  Administered 2018-05-01 – 2018-05-02 (×2): 80 mg via ORAL
  Filled 2018-05-01 (×2): qty 2

## 2018-05-01 MED ORDER — GABAPENTIN 300 MG PO CAPS
300.0000 mg | ORAL_CAPSULE | Freq: Once | ORAL | Status: AC
  Administered 2018-05-01: 300 mg via ORAL
  Filled 2018-05-01: qty 1

## 2018-05-01 MED ORDER — GLYCOPYRROLATE 0.2 MG/ML IV SOSY
PREFILLED_SYRINGE | INTRAVENOUS | Status: AC
  Filled 2018-05-01: qty 5

## 2018-05-01 MED ORDER — OXYCODONE HCL 5 MG PO TABS: 5.0000 mg | ORAL_TABLET | Freq: Once | ORAL | Status: DC | PRN

## 2018-05-01 MED ORDER — PROPOFOL 10 MG/ML IV BOLUS
INTRAVENOUS | Status: DC | PRN
  Administered 2018-05-01: 130 mg via INTRAVENOUS

## 2018-05-01 MED ORDER — FERROUS SULFATE 325 (65 FE) MG PO TABS
325.0000 mg | ORAL_TABLET | Freq: Three times a day (TID) | ORAL | Status: DC
  Administered 2018-05-02 – 2018-05-03 (×3): 325 mg via ORAL
  Filled 2018-05-01 (×4): qty 1

## 2018-05-01 MED ORDER — GLYCOPYRROLATE PF 0.2 MG/ML IJ SOSY
PREFILLED_SYRINGE | INTRAMUSCULAR | Status: DC | PRN
  Administered 2018-05-01: .2 mg via INTRAVENOUS

## 2018-05-01 MED ORDER — ONDANSETRON HCL 4 MG/2ML IJ SOLN
4.0000 mg | Freq: Four times a day (QID) | INTRAMUSCULAR | Status: DC | PRN
  Administered 2018-05-01 – 2018-05-02 (×2): 4 mg via INTRAVENOUS
  Filled 2018-05-01 (×2): qty 2

## 2018-05-01 MED ORDER — OXYCODONE HCL 5 MG/5ML PO SOLN: 5.0000 mg | Freq: Once | ORAL | Status: DC | PRN

## 2018-05-01 MED ORDER — DEXAMETHASONE SODIUM PHOSPHATE 10 MG/ML IJ SOLN
8.0000 mg | Freq: Once | INTRAMUSCULAR | Status: AC
  Administered 2018-05-01: 10 mg via INTRAVENOUS

## 2018-05-01 MED ORDER — ROCURONIUM BROMIDE 10 MG/ML (PF) SYRINGE
PREFILLED_SYRINGE | INTRAVENOUS | Status: DC | PRN
  Administered 2018-05-01: 10 mg via INTRAVENOUS
  Administered 2018-05-01: 50 mg via INTRAVENOUS

## 2018-05-01 MED ORDER — DOCUSATE SODIUM 100 MG PO CAPS
100.0000 mg | ORAL_CAPSULE | Freq: Two times a day (BID) | ORAL | Status: DC
  Administered 2018-05-01: 100 mg via ORAL
  Filled 2018-05-01 (×4): qty 1

## 2018-05-01 MED ORDER — TRANEXAMIC ACID 1000 MG/10ML IV SOLN
INTRAVENOUS | Status: DC | PRN
  Administered 2018-05-01: 2000 mg via TOPICAL

## 2018-05-01 MED ORDER — SODIUM CHLORIDE 0.9 % IR SOLN
Status: DC | PRN
  Administered 2018-05-01: 1000 mL

## 2018-05-01 MED ORDER — ALUM & MAG HYDROXIDE-SIMETH 200-200-20 MG/5ML PO SUSP: 30.0000 mL | ORAL | Status: DC | PRN

## 2018-05-01 MED ORDER — SUCCINYLCHOLINE CHLORIDE 200 MG/10ML IV SOSY
PREFILLED_SYRINGE | INTRAVENOUS | Status: DC | PRN
  Administered 2018-05-01: 200 mg via INTRAVENOUS

## 2018-05-01 MED ORDER — HYDROMORPHONE HCL 1 MG/ML IJ SOLN
0.5000 mg | INTRAMUSCULAR | Status: DC | PRN
  Administered 2018-05-02: 1 mg via INTRAVENOUS
  Filled 2018-05-01: qty 1

## 2018-05-01 MED ORDER — GABAPENTIN 300 MG PO CAPS
300.0000 mg | ORAL_CAPSULE | Freq: Three times a day (TID) | ORAL | Status: DC
  Administered 2018-05-01 – 2018-05-03 (×6): 300 mg via ORAL
  Filled 2018-05-01 (×6): qty 1

## 2018-05-01 MED ORDER — BISACODYL 5 MG PO TBEC
5.0000 mg | DELAYED_RELEASE_TABLET | Freq: Every day | ORAL | Status: DC | PRN
Start: 2018-05-01 — End: 2018-05-03

## 2018-05-01 MED ORDER — ROPIVACAINE HCL 7.5 MG/ML IJ SOLN
INTRAMUSCULAR | Status: DC | PRN
  Administered 2018-05-01: 25 mL via PERINEURAL

## 2018-05-01 MED ORDER — SODIUM CHLORIDE 0.9 % IJ SOLN
INTRAMUSCULAR | Status: DC | PRN
Start: 2018-05-01 — End: 2018-05-01
  Administered 2018-05-01: 20 mL

## 2018-05-01 MED ORDER — POLYETHYLENE GLYCOL 3350 17 G PO PACK: 17.0000 g | PACK | Freq: Every day | ORAL | Status: DC | PRN

## 2018-05-01 MED ORDER — FENTANYL CITRATE (PF) 100 MCG/2ML IJ SOLN
50.0000 ug | INTRAMUSCULAR | Status: DC
  Administered 2018-05-01: 100 ug via INTRAVENOUS
  Filled 2018-05-01: qty 2

## 2018-05-01 MED ORDER — METOCLOPRAMIDE HCL 5 MG PO TABS: 5.0000 mg | ORAL_TABLET | Freq: Three times a day (TID) | ORAL | Status: DC | PRN

## 2018-05-01 SURGICAL SUPPLY — 73 items
BAG DECANTER FOR FLEXI CONT (MISCELLANEOUS) ×4 IMPLANT
BAG ZIPLOCK 12X15 (MISCELLANEOUS) IMPLANT
BANDAGE ACE 4X5 VEL STRL LF (GAUZE/BANDAGES/DRESSINGS) ×2 IMPLANT
BANDAGE ACE 6X5 VEL STRL LF (GAUZE/BANDAGES/DRESSINGS) ×2 IMPLANT
BLADE SAG 18X100X1.27 (BLADE) ×2 IMPLANT
BLADE SAW SGTL 11.0X1.19X90.0M (BLADE) ×2 IMPLANT
BNDG GAUZE ELAST 4 BULKY (GAUZE/BANDAGES/DRESSINGS) ×2 IMPLANT
BONE CEMENT GENTAMICIN (Cement) ×4 IMPLANT
CAP KNEE TOTAL 3 SIGMA ×2 IMPLANT
CEMENT BONE GENTAMICIN 40 (Cement) ×2 IMPLANT
COVER SURGICAL LIGHT HANDLE (MISCELLANEOUS) ×2 IMPLANT
CUFF TOURN SGL QUICK 34 (TOURNIQUET CUFF) ×1
CUFF TRNQT CYL 34X4X40X1 (TOURNIQUET CUFF) ×1 IMPLANT
DECANTER SPIKE VIAL GLASS SM (MISCELLANEOUS) ×2 IMPLANT
DRAPE INCISE IOBAN 66X45 STRL (DRAPES) IMPLANT
DRAPE U-SHAPE 47X51 STRL (DRAPES) ×2 IMPLANT
DRSG ADAPTIC 3X8 NADH LF (GAUZE/BANDAGES/DRESSINGS) ×2 IMPLANT
DRSG PAD ABDOMINAL 8X10 ST (GAUZE/BANDAGES/DRESSINGS) ×4 IMPLANT
DURAPREP 26ML APPLICATOR (WOUND CARE) ×2 IMPLANT
ELECT BLADE TIP CTD 4 INCH (ELECTRODE) ×2 IMPLANT
ELECT REM PT RETURN 15FT ADLT (MISCELLANEOUS) ×2 IMPLANT
EVACUATOR 1/8 PVC DRAIN (DRAIN) ×2 IMPLANT
FACESHIELD WRAPAROUND (MASK) ×4 IMPLANT
GAUZE SPONGE 4X4 12PLY STRL (GAUZE/BANDAGES/DRESSINGS) ×2 IMPLANT
GLOVE BIOGEL PI IND STRL 6.5 (GLOVE) ×1 IMPLANT
GLOVE BIOGEL PI IND STRL 7.0 (GLOVE) ×1 IMPLANT
GLOVE BIOGEL PI IND STRL 7.5 (GLOVE) ×2 IMPLANT
GLOVE BIOGEL PI IND STRL 8.5 (GLOVE) ×1 IMPLANT
GLOVE BIOGEL PI INDICATOR 6.5 (GLOVE) ×1
GLOVE BIOGEL PI INDICATOR 7.0 (GLOVE) ×1
GLOVE BIOGEL PI INDICATOR 7.5 (GLOVE) ×2
GLOVE BIOGEL PI INDICATOR 8.5 (GLOVE) ×1
GLOVE ECLIPSE 8.0 STRL XLNG CF (GLOVE) IMPLANT
GLOVE SURG SS PI 6.5 STRL IVOR (GLOVE) ×2 IMPLANT
GLOVE SURG SS PI 7.5 STRL IVOR (GLOVE) ×4 IMPLANT
GLOVE SURG SS PI 8.0 STRL IVOR (GLOVE) ×2 IMPLANT
GOWN STRL REUS W/TWL LRG LVL3 (GOWN DISPOSABLE) ×2 IMPLANT
GOWN STRL REUS W/TWL XL LVL3 (GOWN DISPOSABLE) ×6 IMPLANT
HANDPIECE INTERPULSE COAX TIP (DISPOSABLE) ×1
HEMOSTAT SPONGE AVITENE ULTRA (HEMOSTASIS) ×2 IMPLANT
HOLDER FOLEY CATH W/STRAP (MISCELLANEOUS) ×2 IMPLANT
IMMOBILIZER KNEE 20 (SOFTGOODS) ×2
IMMOBILIZER KNEE 20 THIGH 36 (SOFTGOODS) ×1 IMPLANT
MANIFOLD NEPTUNE II (INSTRUMENTS) ×2 IMPLANT
NDL SAFETY ECLIPSE 18X1.5 (NEEDLE) IMPLANT
NEEDLE HYPO 18GX1.5 SHARP (NEEDLE)
NEEDLE HYPO 21X1.5 SAFETY (NEEDLE) ×2 IMPLANT
NEEDLE HYPO 22GX1.5 SAFETY (NEEDLE) IMPLANT
NS IRRIG 1000ML POUR BTL (IV SOLUTION) IMPLANT
PACK TOTAL KNEE CUSTOM (KITS) ×2 IMPLANT
PAD ABD 8X10 STRL (GAUZE/BANDAGES/DRESSINGS) ×2 IMPLANT
PADDING CAST COTTON 6X4 STRL (CAST SUPPLIES) ×2 IMPLANT
PENCIL SMOKE EVAC W/HOLSTER (ELECTROSURGICAL) ×2 IMPLANT
POSITIONER SURGICAL ARM (MISCELLANEOUS) ×2 IMPLANT
SET HNDPC FAN SPRY TIP SCT (DISPOSABLE) ×1 IMPLANT
SET PAD KNEE POSITIONER (MISCELLANEOUS) ×2 IMPLANT
SPONGE LAP 18X18 RF (DISPOSABLE) IMPLANT
STRIP CLOSURE SKIN 1/2X4 (GAUZE/BANDAGES/DRESSINGS) ×2 IMPLANT
SUT BONE WAX W31G (SUTURE) ×2 IMPLANT
SUT MNCRL AB 4-0 PS2 18 (SUTURE) ×2 IMPLANT
SUT STRATAFIX 0 PDS 27 VIOLET (SUTURE) ×2
SUT VIC AB 1 CT1 27 (SUTURE) ×2
SUT VIC AB 1 CT1 27XBRD ANTBC (SUTURE) ×2 IMPLANT
SUT VIC AB 2-0 CT1 27 (SUTURE) ×3
SUT VIC AB 2-0 CT1 TAPERPNT 27 (SUTURE) ×3 IMPLANT
SUTURE STRATFX 0 PDS 27 VIOLET (SUTURE) ×1 IMPLANT
SYR 20CC LL (SYRINGE) ×4 IMPLANT
SYR 3ML LL SCALE MARK (SYRINGE) IMPLANT
TOWER CARTRIDGE SMART MIX (DISPOSABLE) ×2 IMPLANT
TRAY FOLEY BAG SILVER LF 16FR (CATHETERS) ×2 IMPLANT
WATER STERILE IRR 1000ML POUR (IV SOLUTION) ×4 IMPLANT
WRAP KNEE MAXI GEL POST OP (GAUZE/BANDAGES/DRESSINGS) ×2 IMPLANT
YANKAUER SUCT BULB TIP 10FT TU (MISCELLANEOUS) ×2 IMPLANT

## 2018-05-01 NOTE — Transfer of Care (Signed)
Immediate Anesthesia Transfer of Care Note  Patient: Todd [name]  Procedure(s) Performed: Procedure(s): RIGHT TOTAL KNEE ARTHROPLASTY (Right)  Patient Location: PACU  Anesthesia Type:General  Level of Consciousness:  sedated, patient cooperative and responds to stimulation  Airway & Oxygen Therapy:Patient Spontanous Breathing and Patient connected to face mask oxgen  Post-op Assessment:  Report given to PACU RN and Post -op Vital signs reviewed and stable  Post vital signs:  Reviewed and stable  Last Vitals:  Vitals:   05/01/18 1203 05/01/18 1204  BP:    Pulse: 60 62  Resp: 14 13  Temp:    SpO2: 69% 22%    Complications: No apparent anesthesia complications

## 2018-05-01 NOTE — Anesthesia Preprocedure Evaluation (Addendum)
Anesthesia Evaluation  Patient identified by MRN, date of birth, ID band Patient awake    Reviewed: Allergy & Precautions, H&P , NPO status , Patient's Chart, lab work & pertinent test results, reviewed documented beta blocker date and time   History of Anesthesia Complications (+) PROLONGED EMERGENCE and history of anesthetic complications  Airway Mallampati: II  TM Distance: >3 FB Neck ROM: Full    Dental  (+) Dental Advisory Given, Poor Dentition, Edentulous Upper,    Pulmonary Current Smoker,    breath sounds clear to auscultation       Cardiovascular hypertension, + CAD, + Past MI and + Cardiac Stents   Rhythm:Regular Rate:Normal  NSTEMI with DES to LAD; 100% nondominant RCA with collaterals - EF is normal.    Neuro/Psych    GI/Hepatic Neg liver ROS,   Endo/Other  diabetes, Type 2  Renal/GU      Musculoskeletal  (+) Arthritis , Osteoarthritis,    Abdominal   Peds  Hematology   Anesthesia Other Findings   Reproductive/Obstetrics                            Anesthesia Physical  Anesthesia Plan  ASA: III  Anesthesia Plan: General   Post-op Pain Management: GA combined w/ Regional for post-op pain   Induction: Intravenous  PONV Risk Score and Plan: 2 and Treatment may vary due to age or medical condition, Ondansetron and Dexamethasone  Airway Management Planned: Oral ETT and LMA  Additional Equipment: None  Intra-op Plan:   Post-operative Plan:   Informed Consent: I have reviewed the patients History and Physical, chart, labs and discussed the procedure including the risks, benefits and alternatives for the proposed anesthesia with the patient or authorized representative who has indicated his/her understanding and acceptance.   Dental advisory given  Plan Discussed with: CRNA, Anesthesiologist and Surgeon  Anesthesia Plan Comments: (  )      Anesthesia Quick  Evaluation

## 2018-05-01 NOTE — Progress Notes (Signed)
AssistedDr. Oddono with right, ultrasound guided, adductor canal block. Side rails up, monitors on throughout procedure. See vital signs in flow sheet. Tolerated Procedure well.  

## 2018-05-01 NOTE — Brief Op Note (Signed)
05/01/2018  2:38 PM  PATIENT:  Todd [name]  57 y.o. 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  POST-OPERATIVE DIAGNOSIS:  Same as Pre-Op  PROCEDURE:  Procedure(s): RIGHT TOTAL KNEE ARTHROPLASTY (Right) with Release of contractures,  SURGEON:  Surgeon(s) and Role:    [name], MD - Primary  PHYSICIAN ASSISTANT: Ardeen Jourdain PA  ASSISTANTS: Ardeen Jourdain PA   ANESTHESIA:   general and an Engineer, technical sales.  EBL:  50 mL   BLOOD ADMINISTERED:none  DRAINS: (One) Hemovact drain(s) in the Right Knee with  Suction Open   LOCAL MEDICATIONS USED:  OTHER 20cc of Exparel mixed with 20cc of Normal Saline.  SPECIMEN:  No Specimen  DISPOSITION OF SPECIMEN:  N/A  COUNTS:  YES  TOURNIQUET:  * Missing tourniquet times found for documented tourniquets in log: [id number] *  DICTATION: .Other Dictation: Dictation Number [id number]  PLAN OF CARE: Admit to inpatient   PATIENT DISPOSITION:  Stable in OR   Delay start of Pharmacological VTE agent (>24hrs) due to surgical blood loss or risk of bleeding: yes

## 2018-05-01 NOTE — Op Note (Signed)
NAMELAREN, WHALING MEDICAL RECORD [id number] ACCOUNT [id number] DATE OF BIRTH:December 29, 1960 FACILITY: [facility] LOCATION: [facility] PHYSICIAN:Xerxes Agrusa Fransico Setters, MD  OPERATIVE REPORT  DATE OF PROCEDURE:  05/01/2018  SURGEON:  Kipp Brood. Gladstone Lighter, MD  ASSISTANT:  Ardeen Jourdain, PA.   PREOPERATIVE DIAGNOSES:   1.  Severe primary osteoarthritis with bone-on-bone right knee. 2.  Flexion contracture, right knee.  POSTOPERATIVE DIAGNOSES:   1.  Severe primary osteoarthritis with bone-on-bone right knee. 2.  Flexion contracture, right knee.  OPERATION: 1.  Release of flexion contractures, right knee. 2...  Right total knee arthroplasty utilizing the DePuy system.  All three components were cemented.  The sizes used was a size 38 patella with 3 pegs.  The femoral component was a size right posterior cruciate sacrificing type.  The tibial tray was a  size 4 tray.  The insert was a size 4, mm thickness rotating platform, polyethylene tray.  DESCRIPTION OF PROCEDURE:  Under general anesthesia, routine orthopedic prep and draping of the right lower extremity was carried out.  The patient had 1 gram of vancomycin.  The preop nasal swab did show some staph.  At this time, the appropriate  timeout was carried out.  I also marked the appropriate right leg in the holding area.  Leg then was exsanguinated with an Esmarch after the prep.  The tourniquet was elevated at 325 mmHg.  The knee then was flexed and placed in the [facility] knee holder.   An anterior approach to the knee was carried out to flaps were created.  I then inserted a self-retaining retractor.  A medial patellar incision was made in the usual fashion.  The patella was reflected laterally.  At this particular point, I did a  medial and lateral meniscectomy and excised the anterior and posterior cruciate ligaments.  The knee was extremely scarred down.  He had a severe chronic synovitis.  We did a synovectomy as well.  Following that, we then  made our initial cut in the tibia  4 mm thickness from the medial side.  It was used as a guide.  After we cut the tibial plateau, we then went on and did our notch cut of the distal femur.  Prior to that, I should say the initial procedure in regards to our cuts was the femoral condyle  which was measured to be a size 4.  We did our anterior, posterior chamfering cuts for a size right femoral component.  After that was done and the tibia was prepared, we then did our notch cut on the distal femur.  Following that, we inserted our lamina  spreaders, we removed the posterior spurs in the condyle and checked for any loose bodies.  We then inserted our spacer block and we had a nice fit with a 10 mm thickness block.  We then removed that, irrigated the knee out and then went on to continue  to prepare the tibial plateau.  We did our appropriate initial drill hole and then our notch cut of the tibial plateau.  The femoral notch cut then was made as I mentioned earlier.  We then inserted our trial components, reduced the knee and had an  excellent fit.  We then did a resurfacing procedure on the patella for a size 38 patella.  We did a synovectomy as well.  After this, all trial components were removed.  We thoroughly water picked out the knee and cemented all 3 components in  simultaneously.  After the cement was hardened,  we did a meticulous debridement of any loose pieces of cement.  We water picked the knee checked for cement again and water picked the knee out once again after the cement hardened.  Of note we then  inserted our temporary tray again to check for our space and we elected to use a 10 mm thickness rotating platform.  After the knee was cleared out, we inserted our permanent 10 mm thickness polyethylene rotating platform size 4 reduced the knee and had  an excellent fit.  We then inserted a Hemovac drain, closed the knee in layers in the usual fashion.  The subcu was closed with a running locking  subcuticular suture.  Sterile dressings were applied.  Once again, the surgeon, Dr. Gladstone Lighter and assistant  Ardeen Jourdain, [place].  Martin Majestic  D:05/01/2018 T:05/01/2018 JOB:[id number]

## 2018-05-01 NOTE — Anesthesia Procedure Notes (Addendum)
Procedure Name: Intubation Date/Time: 05/01/2018 12:55 PM Performed by: Lavina Hamman, CRNA Pre-anesthesia Checklist: Patient identified, Emergency Drugs available, Suction available, Patient being monitored and Timeout performed Patient Re-evaluated:Patient Re-evaluated prior to induction Oxygen Delivery Method: Circle system utilized Preoxygenation: Pre-oxygenation with 100% oxygen Induction Type: IV induction Ventilation: Mask ventilation without difficulty and Oral airway inserted - appropriate to patient size Laryngoscope Size: Mac and 4 Grade View: Grade II Tube type: Oral Tube size: 7.5 mm Number of attempts: 1 Airway Equipment and Method: Stylet Placement Confirmation: ETT inserted through vocal cords under direct vision,  positive ETCO2,  CO2 detector and breath sounds checked- equal and bilateral Secured at: 22 cm Tube secured with: Tape Dental Injury: Teeth and Oropharynx as per pre-operative assessment

## 2018-05-01 NOTE — Anesthesia Procedure Notes (Signed)
Anesthesia Regional Block: Adductor canal block   Pre-Anesthetic Checklist: ,, timeout performed, Correct Patient, Correct Site, Correct Laterality, Correct Procedure, Correct Position, site marked, Risks and benefits discussed,  Surgical consent,  Pre-op evaluation,  At surgeon's request and post-op pain management  Laterality: Right  Prep: chloraprep       Needles:  Injection technique: Single-shot  Needle Type: Echogenic Stimulator Needle     Needle Length: 5cm  Needle Gauge: 22     Additional Needles:   Procedures:, nerve stimulator,,, ultrasound used (permanent image in chart),,,,  Narrative:  Start time: 05/01/2018 12:00 PM End time: 05/01/2018 12:05 PM Injection made incrementally with aspirations every 5 mL.  Performed by: Personally  Anesthesiologist: Janeece Riggers, MD  Additional Notes: Functioning IV was confirmed and monitors were applied.  A 59mm 22ga Arrow echogenic stimulator needle was used. Sterile prep and drape,hand hygiene and sterile gloves were used. Ultrasound guidance: relevant anatomy identified, needle position confirmed, local anesthetic spread visualized around nerve(s)., vascular puncture avoided.  Image printed for medical record. Negative aspiration and negative test dose prior to incremental administration of local anesthetic. The patient tolerated the procedure well.

## 2018-05-01 NOTE — Interval H&P Note (Signed)
History and Physical Interval Note:  05/01/2018 12:05 PM  Todd [name]  has presented today for surgery, with the diagnosis of right knee osteoarthritis  The various methods of treatment have been discussed with the patient and family. After consideration of risks, benefits and other options for treatment, the patient has consented to  Procedure(s): RIGHT TOTAL KNEE ARTHROPLASTY (Right) as a surgical intervention .  The patient's history has been reviewed, patient examined, no change in status, stable for surgery.  I have reviewed the patient's chart and labs.  Questions were answered to the patient's satisfaction.     [name]

## 2018-05-01 NOTE — Anesthesia Postprocedure Evaluation (Signed)
Anesthesia Post Note  Patient: Ammar Moffatt Pranger  Procedure(s) Performed: RIGHT TOTAL KNEE ARTHROPLASTY (Right Knee)     Patient location during evaluation: PACU Anesthesia Type: General Level of consciousness: awake and alert Pain management: pain level controlled Vital Signs Assessment: post-procedure vital signs reviewed and stable Respiratory status: spontaneous breathing, nonlabored ventilation, respiratory function stable and patient connected to nasal cannula oxygen Cardiovascular status: blood pressure returned to baseline and stable Postop Assessment: no apparent nausea or vomiting Anesthetic complications: no    Last Vitals:  Vitals:   05/01/18 1515 05/01/18 1530  BP: (!) 164/102 (!) 172/98  Pulse: 66 61  Resp: 15 16  Temp:    SpO2: 99% 99%    Last Pain:  Vitals:   05/01/18 1509  PainSc: 0-No pain                 Colleen Donahoe

## 2018-05-02 ENCOUNTER — Encounter (HOSPITAL_COMMUNITY): Payer: Self-pay | Admitting: Orthopedic Surgery

## 2018-05-02 DIAGNOSIS — M1711 Unilateral primary osteoarthritis, right knee: Secondary | ICD-10-CM | POA: Diagnosis not present

## 2018-05-02 LAB — BASIC METABOLIC PANEL
Anion gap: 9 (ref 5–15)
BUN: 11 mg/dL (ref 6–20)
CALCIUM: 8.7 mg/dL — AB (ref 8.9–10.3)
CO2: 26 mmol/L (ref 22–32)
Chloride: 98 mmol/L (ref 98–111)
Creatinine, Ser: 0.88 mg/dL (ref 0.61–1.24)
Glucose, Bld: 218 mg/dL — ABNORMAL HIGH (ref 70–99)
Potassium: 4.5 mmol/L (ref 3.5–5.1)
SODIUM: 133 mmol/L — AB (ref 135–145)

## 2018-05-02 LAB — CBC
HEMATOCRIT: 40.6 % (ref 39.0–52.0)
Hemoglobin: 14.3 g/dL (ref 13.0–17.0)
MCH: 33.8 pg (ref 26.0–34.0)
MCHC: 35.2 g/dL (ref 30.0–36.0)
MCV: 96 fL (ref 78.0–100.0)
PLATELETS: 170 10*3/uL (ref 150–400)
RBC: 4.23 MIL/uL (ref 4.22–5.81)
RDW: 13 % (ref 11.5–15.5)
WBC: 18.7 10*3/uL — ABNORMAL HIGH (ref 4.0–10.5)

## 2018-05-02 LAB — GLUCOSE, CAPILLARY
GLUCOSE-CAPILLARY: 286 mg/dL — AB (ref 70–99)
GLUCOSE-CAPILLARY: 181 mg/dL — AB (ref 70–99)
Glucose-Capillary: 178 mg/dL — ABNORMAL HIGH (ref 70–99)

## 2018-05-02 MED ORDER — HYDROCODONE-ACETAMINOPHEN 7.5-325 MG PO TABS
1.0000 | ORAL_TABLET | Freq: Four times a day (QID) | ORAL | Status: DC | PRN
  Administered 2018-05-02 (×4): 1 via ORAL
  Administered 2018-05-03 (×2): 2 via ORAL
  Filled 2018-05-02: qty 2
  Filled 2018-05-02 (×2): qty 1
  Filled 2018-05-02: qty 2
  Filled 2018-05-02 (×3): qty 1
  Filled 2018-05-02: qty 2

## 2018-05-02 MED ORDER — HYDROCODONE-ACETAMINOPHEN 7.5-325 MG PO TABS
1.0000 | ORAL_TABLET | Freq: Four times a day (QID) | ORAL | Status: DC | PRN
Start: 2018-05-02 — End: 2018-05-02

## 2018-05-02 MED ORDER — OXYCODONE HCL 5 MG PO TABS: 5.0000 mg | ORAL_TABLET | ORAL | Status: DC | PRN

## 2018-05-02 MED ORDER — CALCIUM CARBONATE ANTACID 500 MG PO CHEW
1.0000 | CHEWABLE_TABLET | Freq: Three times a day (TID) | ORAL | Status: DC
  Administered 2018-05-02 – 2018-05-03 (×4): 200 mg via ORAL
  Filled 2018-05-02 (×4): qty 1

## 2018-05-02 NOTE — Evaluation (Addendum)
Physical Therapy Evaluation Patient Details Name: Todd [name] MRN: [id number] DOB: Aug 19, 1961 Today's Date: 05/02/2018   History of Present Illness  s/p R TKA  Clinical Impression  Pt is s/p TKA resulting in the deficits listed below (see PT Problem List). Pt ambulated 28' with RW, distance limited by R knee pain. Initiated HEP. Good progress expected.  Pt will benefit from skilled PT to increase their independence and safety with mobility to allow discharge to the venue listed below.      Follow Up Recommendations Follow surgeon's recommendation for DC plan and follow-up therapies    Equipment Recommendations  Rolling walker with 5" wheels    Recommendations for Other Services       Precautions / Restrictions Precautions Precautions: Knee- reviewed no pillow under knee Required Braces or Orthoses: Knee Immobilizer - Right Restrictions Weight Bearing Restrictions: No Other Position/Activity Restrictions: WBAT      Mobility  Bed Mobility               General bed mobility comments: up in recliner  Transfers Overall transfer level: Needs assistance Equipment used: Rolling walker (2 wheeled) Transfers: Sit to/from Stand Sit to Stand: Min assist         General transfer comment: steadying assistance and cues for UE/LE placement  Ambulation/Gait Ambulation/Gait assistance: Min guard Gait Distance (Feet): 28 Feet Assistive device: Rolling walker (2 wheeled) Gait Pattern/deviations: Step-to pattern;Decreased step length - right;Decreased step length - left;Antalgic Gait velocity: decr   General Gait Details: VCs sequencing, distance limited by pain  Stairs            Wheelchair Mobility    Modified Rankin (Stroke Patients Only)       Balance Overall balance assessment: Modified Independent                                           Pertinent Vitals/Pain Pain Assessment: 0-10 Pain Score: 10-Worst pain ever Pain Location: R  knee with weight bearing Pain Descriptors / Indicators: Aching Pain Intervention(s): Limited activity within patient's tolerance;Monitored during session;Premedicated before session;Ice applied    Home Living Family/patient expects to be discharged to:: Private residence Living Arrangements: Spouse/significant other;Children Available Help at Discharge: Family Type of Home: House Home Access: Stairs to enter   Technical brewer of Steps: 2 Home Layout: One level Home Equipment: Shower seat Additional Comments: wife has walker; wife sometimes uses. Wife uses quad cane mostly    Prior Function Level of Independence: Independent               Hand Dominance        Extremity/Trunk Assessment   Upper Extremity Assessment Upper Extremity Assessment: Overall WFL for tasks assessed    Lower Extremity Assessment Lower Extremity Assessment: RLE deficits/detail RLE Deficits / Details: 10-35* AAROM R knee, SLR 2/5 RLE Sensation: WNL    Cervical / Trunk Assessment Cervical / Trunk Assessment: Normal  Communication   Communication: No difficulties  Cognition Arousal/Alertness: Awake/alert Behavior During Therapy: WFL for tasks assessed/performed Overall Cognitive Status: Within Functional Limits for tasks assessed                                        General Comments      Exercises Total Joint Exercises Ankle Circles/Pumps: AROM;Both;10 reps;Supine  Quad Sets: AROM;Right;Supine;10 reps Short Arc Quad: AAROM;AROM;Right;10 reps;Supine Heel Slides: AAROM;Right;10 reps;Supine Goniometric ROM: 10-35* AAROM R knee   Assessment/Plan    PT Assessment Patient needs continued PT services  PT Problem List Decreased strength;Decreased range of motion;Decreased mobility;Decreased activity tolerance;Decreased balance;Pain       PT Treatment Interventions DME instruction;Gait training;Stair training;Therapeutic exercise;Therapeutic activities;Functional  mobility training;Balance training;Patient/family education    PT Goals (Current goals can be found in the Care Plan section)  Acute Rehab PT Goals Patient Stated Goal: less pain; return to [facility], work on cars PT Goal Formulation: With patient/family Time For Goal Achievement: 05/09/18 Potential to Achieve Goals: Good    Frequency 7X/week   Barriers to discharge        Co-evaluation               AM-PAC PT "6 Clicks" Daily Activity  Outcome Measure Difficulty turning over in bed (including adjusting bedclothes, sheets and blankets)?: A Lot Difficulty moving from lying on back to sitting on the side of the bed? : Unable Difficulty sitting down on and standing up from a chair with arms (e.g., wheelchair, bedside commode, etc,.)?: Unable Help needed moving to and from a bed to chair (including a wheelchair)?: A Lot Help needed walking in hospital room?: A Little Help needed climbing 3-5 steps with a railing? : Total 6 Click Score: 10    End of Session Equipment Utilized During Treatment: Gait belt Activity Tolerance: Patient limited by pain Patient left: in chair;with call bell/phone within reach;with bed alarm set;with chair alarm set Nurse Communication: Mobility status PT Visit Diagnosis: Muscle weakness (generalized) (M62.81);Difficulty in walking, not elsewhere classified (R26.2);Pain Pain - Right/Left: Right Pain - part of body: Knee    Time: 4034-7425 PT Time Calculation (min) (ACUTE ONLY): 21 min   Charges:   PT Evaluation $PT Eval Low Complexity: 1 Low     PT G Codes:          Philomena Doheny 05/02/2018, 11:11 AM [phone]

## 2018-05-02 NOTE — Plan of Care (Signed)
Reviewed plan of care with pt and family, specifically pain control, safety precautions, IS use, and importance of notifying RN with any questions or concerns. Pt attentive and verbalized understanding of all education.

## 2018-05-02 NOTE — Progress Notes (Signed)
Physical Therapy Treatment Patient Details Name: Todd [name] MRN: [id number] DOB: 08-30-1961 Today's Date: 05/02/2018    History of Present Illness s/p R TKA    PT Comments    Pt tolerated increased ambulation distance of 93' with RW, distance limited by pain. Performed TKA exercises with min assist.   Follow Up Recommendations  Follow surgeon's recommendation for DC plan and follow-up therapies     Equipment Recommendations  Rolling walker with 5" wheels    Recommendations for Other Services       Precautions / Restrictions Precautions Precautions: Knee Precaution Booklet Issued: Yes (comment) Precaution Comments: reviewed no pillow under knee Required Braces or Orthoses: Knee Immobilizer - Right Restrictions Weight Bearing Restrictions: No Other Position/Activity Restrictions: WBAT    Mobility  Bed Mobility               General bed mobility comments: up in recliner  Transfers Overall transfer level: Needs assistance Equipment used: Rolling walker (2 wheeled) Transfers: Sit to/from Stand Sit to Stand: Min assist         General transfer comment: steadying assistance and cues for UE/LE placement  Ambulation/Gait Ambulation/Gait assistance: Min guard Gait Distance (Feet): 70 Feet Assistive device: Rolling walker (2 wheeled) Gait Pattern/deviations: Step-to pattern;Decreased step length - right;Decreased step length - left;Antalgic Gait velocity: decr   General Gait Details: VCs sequencing, distance limited by pain   Stairs             Wheelchair Mobility    Modified Rankin (Stroke Patients Only)       Balance Overall balance assessment: Modified Independent                                          Cognition Arousal/Alertness: Awake/alert Behavior During Therapy: WFL for tasks assessed/performed Overall Cognitive Status: Within Functional Limits for tasks assessed                                         Exercises Total Joint Exercises Ankle Circles/Pumps: AROM;Both;10 reps;Supine Quad Sets: AROM;Right;Supine;10 reps Short Arc Quad: AAROM;AROM;Right;10 reps;Supine Heel Slides: AAROM;Right;10 reps;Supine Straight Leg Raises: AAROM;Right;Supine(7 reps, limited by pain) Goniometric ROM: 5-40* AAROM R knee    General Comments        Pertinent Vitals/Pain Pain Assessment: 0-10 Pain Score: 10-Worst pain ever Pain Location: R knee with weight bearing Pain Descriptors / Indicators: Aching Pain Intervention(s): Limited activity within patient's tolerance;Monitored during session;Premedicated before session;Ice applied    Home Living Family/patient expects to be discharged to:: Private residence Living Arrangements: Spouse/significant other;Children Available Help at Discharge: Family Type of Home: House Home Access: Stairs to enter   Home Layout: One level Home Equipment: Shower seat Additional Comments: wife has walker; wife sometimes uses. Wife uses quad cane mostly    Prior Function Level of Independence: Independent          PT Goals (current goals can now be found in the care plan section) Acute Rehab PT Goals Patient Stated Goal: less pain; return to [facility], work on cars PT Goal Formulation: With patient/family Time For Goal Achievement: 05/09/18 Potential to Achieve Goals: Good Progress towards PT goals: Progressing toward goals    Frequency    7X/week      PT Plan Current plan remains appropriate  Co-evaluation              AM-PAC PT "6 Clicks" Daily Activity  Outcome Measure  Difficulty turning over in bed (including adjusting bedclothes, sheets and blankets)?: A Lot Difficulty moving from lying on back to sitting on the side of the bed? : Unable Difficulty sitting down on and standing up from a chair with arms (e.g., wheelchair, bedside commode, etc,.)?: Unable Help needed moving to and from a bed to chair (including a wheelchair)?: A  Lot Help needed walking in hospital room?: A Little Help needed climbing 3-5 steps with a railing? : Total 6 Click Score: 10    End of Session Equipment Utilized During Treatment: Gait belt Activity Tolerance: Patient limited by pain Patient left: in chair;with call bell/phone within reach;with bed alarm set;with chair alarm set Nurse Communication: Mobility status PT Visit Diagnosis: Muscle weakness (generalized) (M62.81);Difficulty in walking, not elsewhere classified (R26.2);Pain Pain - Right/Left: Right Pain - part of body: Knee     Time: 1364-3837 PT Time Calculation (min) (ACUTE ONLY): 16 min  Charges:  $Gait Training: 8-22 mins                    G Codes:          Blondell Reveal Kistler 05/02/2018, 1:09 PM [phone]

## 2018-05-02 NOTE — Evaluation (Signed)
Occupational Therapy Evaluation Patient Details Name: Todd [name] MRN: [id number] DOB: Jun 03, 1961 Today's Date: 05/02/2018    History of Present Illness s/p R TKA   Clinical Impression   This 57 year old man was admitted for the above sx.  He will benefit from continued OT in acute to further educate on bathroom transfers and assess need for DME vs none    Follow Up Recommendations  Supervision/Assistance - 24 hour    Equipment Recommendations  (tba: pt has a high commode)    Recommendations for Other Services       Precautions / Restrictions Precautions Precautions: Knee Required Braces or Orthoses: Knee Immobilizer - Right Restrictions Weight Bearing Restrictions: No Other Position/Activity Restrictions: WBAT      Mobility Bed Mobility               General bed mobility comments: min A for RLE for OOB  Transfers Overall transfer level: Needs assistance Equipment used: Rolling walker (2 wheeled) Transfers: Sit to/from Stand Sit to Stand: Min assist         General transfer comment: steadying assistance and cues for UE/LE placement    Balance                                           ADL either performed or assessed with clinical judgement   ADL Overall ADL's : Needs assistance/impaired             Lower Body Bathing: Moderate assistance;Sit to/from stand       Lower Body Dressing: Maximal assistance;Sit to/from stand   Toilet Transfer: Minimal assistance;Stand-pivot;RW(to chair)             General ADL Comments: pt needs set up for UB adls.  Wife will assist with adls as needed.  Educated on knee precautions.  Pt has a high commode at home with nothing to push up from. did not practice due to pain     Vision         Perception     Praxis      Pertinent Vitals/Pain Pain Assessment: 0-10 Pain Score: 7  Pain Location: R knee with weight bearing Pain Descriptors / Indicators: Aching Pain Intervention(s):  Limited activity within patient's tolerance;Monitored during session;Premedicated before session;Repositioned;Patient requesting pain meds-RN notified;Ice applied     Hand Dominance     Extremity/Trunk Assessment Upper Extremity Assessment Upper Extremity Assessment: Overall WFL for tasks assessed           Communication Communication Communication: No difficulties   Cognition Arousal/Alertness: Awake/alert Behavior During Therapy: WFL for tasks assessed/performed Overall Cognitive Status: Within Functional Limits for tasks assessed                                     General Comments       Exercises     Shoulder Instructions      Home Living Family/patient expects to be discharged to:: Private residence Living Arrangements: Spouse/significant other;Children Available Help at Discharge: Family Type of Home: House             Bathroom Shower/Tub: Walk-in shower   Bathroom Toilet: Handicapped height     Home Equipment: Clinical cytogeneticist - 2 wheels   Additional Comments: wife has walker; wife sometimes uses. Wife uses quad cane mostly  Prior Functioning/Environment Level of Independence: Independent                 OT Problem List: Decreased strength;Decreased activity tolerance;Pain;Decreased knowledge of use of DME or AE      OT Treatment/Interventions: Self-care/ADL training;DME and/or AE instruction;Patient/family education    OT Goals(Current goals can be found in the care plan section) Acute Rehab OT Goals Patient Stated Goal: less pain; return to PLOF OT Goal Formulation: With patient Time For Goal Achievement: 05/16/18 Potential to Achieve Goals: Good ADL Goals Pt Will Transfer to Toilet: with supervision;ambulating(high commode vs 3;1 over this) Pt Will Perform Tub/Shower Transfer: Shower transfer;with min guard assist;ambulating;shower seat  OT Frequency: Min 2X/week   Barriers to D/C:            Co-evaluation               AM-PAC PT "6 Clicks" Daily Activity     Outcome Measure Help from another person eating meals?: None Help from another person taking care of personal grooming?: A Little Help from another person toileting, which includes using toliet, bedpan, or urinal?: A Little Help from another person bathing (including washing, rinsing, drying)?: A Lot Help from another person to put on and taking off regular upper body clothing?: A Little Help from another person to put on and taking off regular lower body clothing?: A Lot 6 Click Score: 17   End of Session    Activity Tolerance: Patient tolerated treatment well Patient left: in chair;with call bell/phone within reach;with chair alarm set;with family/visitor present  OT Visit Diagnosis: Pain Pain - Right/Left: Right Pain - part of body: Knee                Time: 7106-2694 OT Time Calculation (min): 30 min Charges:  OT General Charges $OT Visit: 1 Visit OT Evaluation $OT Eval Low Complexity: 1 Low OT Treatments $Therapeutic Activity: 8-22 mins G-Codes:     [place], OTR/L [phone] 05/02/2018  Jamoni Hewes 05/02/2018, 11:03 AM

## 2018-05-02 NOTE — Progress Notes (Signed)
Noted PT recc-f/u surgeons recc. No CM needs.

## 2018-05-02 NOTE — Discharge Instructions (Addendum)
Dr. [name] [facility] [address] [place], [place] [address] [phone]  TOTAL KNEE REPLACEMENT POSTOPERATIVE DIRECTIONS  Knee Rehabilitation, Guidelines Following Surgery  Results after knee surgery are often greatly improved when you follow the exercise, range of motion and muscle strengthening exercises prescribed by your doctor. Safety measures are also important to protect the knee from further injury. Any time any of these exercises cause you to have increased pain or swelling in your knee joint, decrease the amount until you are comfortable again and slowly increase them. If you have problems or questions, call your caregiver or physical therapist for advice.   HOME CARE INSTRUCTIONS  Remove items at home which could result in a fall. This includes throw rugs or furniture in walking pathways.   ICE to the affected knee every three hours for 30 minutes at a time and then as needed for pain and swelling.  Continue to use ice on the knee for pain and swelling from surgery. You may notice swelling that will progress down to the foot and ankle.  This is normal after surgery.  Elevate the leg when you are not up walking on it.    Continue to use the breathing machine which will help keep your temperature down.  It is common for your temperature to cycle up and down following surgery, especially at night when you are not up moving around and exerting yourself.  The breathing machine keeps your lungs expanded and your temperature down.  Do not place pillow under knee, focus on keeping the knee straight while resting  DIET You may resume your previous home diet once your are discharged from the hospital.  DRESSING / WOUND CARE / SHOWERING You may change your dressing every day with sterile gauze.  Please use good hand washing techniques before changing the dressing.  Do not use any lotions or creams on the incision until instructed by your surgeon. You may start  showering once you are discharged home but do not submerge the incision under water. Just pat the incision dry and apply a dry gauze dressing on daily. Change the surgical dressing daily and reapply a dry dressing each time.  ACTIVITY Walk with your walker as instructed. Use walker as long as suggested by your caregivers. Avoid periods of inactivity such as sitting longer than an hour when not asleep. This helps prevent blood clots.  You may resume a sexual relationship in one month or when given the OK by your doctor.  You may return to work once you are cleared by your doctor.  Do not drive a car for 6 weeks or until released by you surgeon.  Do not drive while taking narcotics.  WEIGHT BEARING Weight bearing as tolerated with assist device (walker, cane, etc) as directed, use it as long as suggested by your surgeon or therapist, typically at least 4-6 weeks.  POSTOPERATIVE CONSTIPATION PROTOCOL Constipation - defined medically as fewer than three stools per week and severe constipation as less than one stool per week.  One of the most common issues patients have following surgery is constipation.  Even if you have a regular bowel pattern at home, your normal regimen is likely to be disrupted due to multiple reasons following surgery.  Combination of anesthesia, postoperative narcotics, change in appetite and fluid intake all can affect your bowels.  In order to avoid complications following surgery, here are some recommendations in order to help you during your recovery period.  Colace (docusate) -  Pick up an over-the-counter form of Colace or another stool softener and take twice a day as long as you are requiring postoperative pain medications.  Take with a full glass of water daily.  If you experience loose stools or diarrhea, hold the colace until you stool forms back up.  If your symptoms do not get better within 1 week or if they get worse, check with your doctor.  Dulcolax (bisacodyl)  - Pick up over-the-counter and take as directed by the product packaging as needed to assist with the movement of your bowels.  Take with a full glass of water.  Use this product as needed if not relieved by Colace only.   MiraLax (polyethylene glycol) - Pick up over-the-counter to have on hand.  MiraLax is a solution that will increase the amount of water in your bowels to assist with bowel movements.  Take as directed and can mix with a glass of water, juice, soda, coffee, or tea.  Take if you go more than two days without a movement. Do not use MiraLax more than once per day. Call your doctor if you are still constipated or irregular after using this medication for 7 days in a row.  If you continue to have problems with postoperative constipation, please contact the office for further assistance and recommendations.  If you experience "the worst abdominal pain ever" or develop nausea or vomiting, please contact the office immediatly for further recommendations for treatment.  ITCHING  If you experience itching with your medications, try taking only a single pain pill, or even half a pain pill at a time.  You can also use Benadryl over the counter for itching or also to help with sleep.   TED HOSE STOCKINGS Wear the elastic stockings on both legs for three weeks following surgery during the day but you may remove then at night for sleeping.  MEDICATIONS See your medication summary on the After Visit Summary that the nursing staff will review with you prior to discharge.  You may have some home medications which will be placed on hold until you complete the course of blood thinner medication.  It is important for you to complete the blood thinner medication as prescribed by your surgeon.  Continue your approved medications as instructed at time of discharge.  PRECAUTIONS If you experience chest pain or shortness of breath - call 911 immediately for transfer to the hospital emergency department.    If you develop a fever greater that 101 F, purulent drainage from wound, increased redness or drainage from wound, foul odor from the wound/dressing, or calf pain - CONTACT YOUR SURGEON.                                                   FOLLOW-UP APPOINTMENTS Make sure you keep all of your appointments after your operation with your surgeon and caregivers. You should call the office at the above phone number and make an appointment for approximately two weeks after the date of your surgery or on the date instructed by your surgeon outlined in the "After Visit Summary".   RANGE OF MOTION AND STRENGTHENING EXERCISES  Rehabilitation of the knee is important following a knee injury or an operation. After just a few days of immobilization, the muscles of the thigh which control the knee become weakened and shrink (atrophy).  Knee exercises are designed to build up the tone and strength of the thigh muscles and to improve knee motion. Often times heat used for twenty to thirty minutes before working out will loosen up your tissues and help with improving the range of motion but do not use heat for the first two weeks following surgery. These exercises can be done on a training (exercise) mat, on the floor, on a table or on a bed. Use what ever works the best and is most comfortable for you Knee exercises include:  Leg Lifts - While your knee is still immobilized in a splint or cast, you can do straight leg raises. Lift the leg to 60 degrees, hold for 3 sec, and slowly lower the leg. Repeat 10-20 times 2-3 times daily. Perform this exercise against resistance later as your knee gets better.  Quad and Hamstring Sets - Tighten up the muscle on the front of the thigh (Quad) and hold for 5-10 sec. Repeat this 10-20 times hourly. Hamstring sets are done by pushing the foot backward against an object and holding for 5-10 sec. Repeat as with quad sets.   Leg Slides: Lying on your back, slowly slide your foot toward  your buttocks, bending your knee up off the floor (only go as far as is comfortable). Then slowly slide your foot back down until your leg is flat on the floor again.  Angel Wings: Lying on your back spread your legs to the side as far apart as you can without causing discomfort.  A rehabilitation program following serious knee injuries can speed recovery and prevent re-injury in the future due to weakened muscles. Contact your doctor or a physical therapist for more information on knee rehabilitation.   IF YOU ARE TRANSFERRED TO A SKILLED REHAB FACILITY If the patient is transferred to a skilled rehab facility following release from the hospital, a list of the current medications will be sent to the facility for the patient to continue.  When discharged from the skilled rehab facility, please have the facility set up the patient's [place] prior to being released. Also, the skilled facility will be responsible for providing the patient with their medications at time of release from the facility to include their pain medication, the muscle relaxants, and their blood thinner medication. If the patient is still at the rehab facility at time of the two week follow up appointment, the skilled rehab facility will also need to assist the patient in arranging follow up appointment in our office and any transportation needs.  MAKE SURE YOU:  Understand these instructions.  Get help right away if you are not doing well or get worse.    Pick up stool softner and laxative for home use following surgery while on pain medications. Do not submerge incision under water. Please use good hand washing techniques while changing dressing each day. May shower starting three days after surgery. Please use a clean towel to pat the incision dry following showers. Continue to use ice for pain and swelling after surgery. Do not use any lotions or creams on the incision until instructed by your  surgeon.  Information on my medicine - XARELTO (Rivaroxaban)  This medication education was reviewed with me or my healthcare representative as part of my discharge preparation.  The pharmacist that spoke with me during my hospital stay was:  Minda Ditto, [facility]  Why was Xarelto prescribed for you? Xarelto was prescribed for you to reduce the risk of  blood clots forming after orthopedic surgery. The medical term for these abnormal blood clots is venous thromboembolism (VTE).  What do you need to know about xarelto ? Take your Xarelto ONCE DAILY at the same time every day. You may take it either with or without food.  If you have difficulty swallowing the tablet whole, you may crush it and mix in applesauce just prior to taking your dose.  Take Xarelto exactly as prescribed by your doctor and DO NOT stop taking Xarelto without talking to the doctor who prescribed the medication.  Stopping without other VTE prevention medication to take the place of Xarelto may increase your risk of developing a clot.  After discharge, you should have regular check-up appointments with your healthcare provider that is prescribing your Xarelto.    What do you do if you miss a dose? If you miss a dose, take it as soon as you remember on the same day then continue your regularly scheduled once daily regimen the next day. Do not take two doses of Xarelto on the same day.   Important Safety Information A possible side effect of Xarelto is bleeding. You should call your healthcare provider right away if you experience any of the following: ? Bleeding from an injury or your nose that does not stop. ? Unusual colored urine (red or dark brown) or unusual colored stools (red or black). ? Unusual bruising for unknown reasons. ? A serious fall or if you hit your head (even if there is no bleeding).  Some medicines may interact with Xarelto and might increase your risk of bleeding while on Xarelto. To help  avoid this, consult your healthcare provider or pharmacist prior to using any new prescription or non-prescription medications, including herbals, vitamins, non-steroidal anti-inflammatory drugs (NSAIDs) and supplements.  This website has more information on Xarelto: [email or web address]

## 2018-05-02 NOTE — Progress Notes (Signed)
Subjective: 1 Day Post-Op Procedure(s) (LRB): RIGHT TOTAL KNEE ARTHROPLASTY (Right) Patient reports pain as moderate.   Patient seen in rounds with Dr. Gladstone Lighter. Patient is well, but has had some minor complaints of nausea/vomiting. He is feeling better in that regard this morning. No SOB or chest pain.  We will start therapy today.  Plan is to go Home after hospital stay.  Objective: Vital signs in last 24 hours: Temp:  [97.6 F (36.4 C)-97.8 F (36.6 C)] 97.8 F (36.6 C) (06/27 0515) Pulse Rate:  [59-86] 84 (06/27 0515) Resp:  [10-21] 17 (06/27 0515) BP: (131-172)/(79-102) 136/80 (06/27 0515) SpO2:  [93 %-100 %] 97 % (06/27 0515) Weight:  [97.1 kg (214 lb)] 97.1 kg (214 lb) (06/26 1015)  Intake/Output from previous day:  Intake/Output Summary (Last 24 hours) at 05/02/2018 8127 Last data filed at 05/02/2018 0600 Gross per 24 hour  Intake 2600 ml  Output 2885 ml  Net -285 ml     Labs: Recent Labs    05/02/18 0510  HGB 14.3   Recent Labs    05/02/18 0510  WBC 18.7*  RBC 4.23  HCT 40.6  PLT 170   Recent Labs    05/02/18 0510  NA 133*  K 4.5  CL 98  CO2 26  BUN 11  CREATININE 0.88  GLUCOSE 218*  CALCIUM 8.7*    EXAM General - Patient is Alert and Oriented Extremity - Neurologically intact Intact pulses distally Dorsiflexion/Plantar flexion intact No cellulitis present Compartment soft Dressing - dressing C/D/I Motor Function - intact, moving foot and toes well on exam.  Hemovac pulled without difficulty.  Past Medical History:  Diagnosis Date  . Arthritis   . CAD (coronary artery disease)    NSTEMI with DES to LAD; 100% nondominant RCA with collaterals - EF is normal.   . 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 Complication of anesthesia    slow to wake up   . Diabetes mellitus type II, non insulin dependent ([place])    DENIES ; STATES HIS PCP TOLD HIM " IM JUST A PRIME CANDIDATE FOR IT"   . HLD (hyperlipidemia)   .  Hydrocele in adult    confirmed by [place], elected to monitor after seeing urology.   . Hyperlipidemia   . Hypertension   . Skin abnormalities    affecting right hand thumb, pointer, and index fingers and palm; left hand thumb pointer and index finger. peeling/cracked/blister skin , warm to touch. scattered areas of superficial skin loss esp to R/L anterior index digits. glossy appearance and edema, reports itchiness, numbness, and pain, no drainage, occurs intermittently, ongoing for years, unsure of triggers, temp.relief w/steroids   . Tobacco abuse     Assessment/Plan: 1 Day Post-Op Procedure(s) (LRB): RIGHT TOTAL KNEE ARTHROPLASTY (Right) Active Problems:   H/O total knee replacement, right  Estimated body mass index is 33.52 kg/m as calculated from the following:   Height as of this encounter: 5\' 7"  (1.702 m).   Weight as of this encounter: 97.1 kg (214 lb). Advance diet Up with therapy D/C IV fluids   Anticipated LOS equal to or greater than 2 midnights due to - Age 29 and older with one or more of the following:  - Obesity  - Expected need for hospital services (PT, OT, Nursing) required for safe  discharge  - Anticipated need for postoperative skilled nursing care or inpatient rehab  - Active co-morbidities: Diabetes and Coronary Artery Disease  OR   - Unanticipated findings during/Post Surgery: None  - Patient is a high risk of re-admission due to: None   DVT Prophylaxis - Xarelto Weight-Bearing as tolerated D/C O2 and Pulse OX and try on Room Air  Will start outpatient therapy today. Plan for DC home tomorrow if he progresses well and is meeting goals.   Ardeen Jourdain, PA-C Orthopaedic Surgery 05/02/2018, 7:12 AM

## 2018-05-03 DIAGNOSIS — M1711 Unilateral primary osteoarthritis, right knee: Secondary | ICD-10-CM | POA: Diagnosis not present

## 2018-05-03 LAB — BASIC METABOLIC PANEL
Anion gap: 7 (ref 5–15)
BUN: 10 mg/dL (ref 6–20)
CO2: 29 mmol/L (ref 22–32)
CREATININE: 0.86 mg/dL (ref 0.61–1.24)
Calcium: 8.4 mg/dL — ABNORMAL LOW (ref 8.9–10.3)
Chloride: 100 mmol/L (ref 98–111)
GFR calc Af Amer: >60 mL/min (ref >60)
GFR calc non Af Amer: >60 mL/min (ref >60)
GLUCOSE: 178 mg/dL — AB (ref 70–99)
Potassium: 3.4 mmol/L — ABNORMAL LOW (ref 3.5–5.1)
Sodium: 136 mmol/L (ref 135–145)

## 2018-05-03 LAB — GLUCOSE, CAPILLARY
Glucose-Capillary: 186 mg/dL — ABNORMAL HIGH (ref 70–99)
Glucose-Capillary: 149 mg/dL — ABNORMAL HIGH (ref 70–99)

## 2018-05-03 LAB — CBC
HCT: 36.6 % — ABNORMAL LOW (ref 39.0–52.0)
Hemoglobin: 12.5 g/dL — ABNORMAL LOW (ref 13.0–17.0)
MCH: 33.2 pg (ref 26.0–34.0)
MCHC: 34.2 g/dL (ref 30.0–36.0)
MCV: 97.3 fL (ref 78.0–100.0)
Platelets: 148 10*3/uL — ABNORMAL LOW (ref 150–400)
RBC: 3.76 MIL/uL — AB (ref 4.22–5.81)
RDW: 13.1 % (ref 11.5–15.5)
WBC: 11.6 10*3/uL — ABNORMAL HIGH (ref 4.0–10.5)

## 2018-05-03 MED ORDER — POTASSIUM CHLORIDE CRYS ER 15 MEQ PO TBCR: 30.0000 meq | EXTENDED_RELEASE_TABLET | Freq: Two times a day (BID) | ORAL | 0 refills | Status: DC

## 2018-05-03 MED ORDER — POLYETHYLENE GLYCOL 3350 17 G PO PACK: 17.0000 g | PACK | Freq: Every day | ORAL | 0 refills | Status: DC | PRN

## 2018-05-03 MED ORDER — HYDROCODONE-ACETAMINOPHEN 7.5-325 MG PO TABS: 1.0000 | ORAL_TABLET | Freq: Four times a day (QID) | ORAL | 0 refills | Status: DC | PRN

## 2018-05-03 MED ORDER — GABAPENTIN 300 MG PO CAPS
300.0000 mg | ORAL_CAPSULE | Freq: Three times a day (TID) | ORAL | 0 refills | Status: DC
Start: 2018-05-03 — End: 2019-12-01

## 2018-05-03 MED ORDER — RIVAROXABAN 10 MG PO TABS: 10.0000 mg | ORAL_TABLET | Freq: Every day | ORAL | 0 refills | Status: DC

## 2018-05-03 MED ORDER — POTASSIUM CHLORIDE CRYS ER 20 MEQ PO TBCR
30.0000 meq | EXTENDED_RELEASE_TABLET | Freq: Two times a day (BID) | ORAL | Status: DC
  Administered 2018-05-03: 30 meq via ORAL
  Filled 2018-05-03: qty 1

## 2018-05-03 MED ORDER — DOCUSATE SODIUM 100 MG PO CAPS: 100.0000 mg | ORAL_CAPSULE | Freq: Two times a day (BID) | ORAL | 0 refills | Status: DC

## 2018-05-03 NOTE — Progress Notes (Addendum)
   Subjective: 2 Days Post-Op Procedure(s) (LRB): RIGHT TOTAL KNEE ARTHROPLASTY (Right) Patient reports pain as mild.   Patient seen in rounds for Dr. Gladstone Lighter. Patient is well, and has had no acute complaints or problems other than hiccups. No SOB or chest pain. No issues overnight. Worked with therapy yesterday. Walked 70 feet. Voiding well. Reports flatus. Plan is to go Home after hospital stay.  Objective: Vital signs in last 24 hours: Temp:  [97.4 F (36.3 C)-98.1 F (36.7 C)] 98 F (36.7 C) (06/28 0437) Pulse Rate:  [72-93] 75 (06/28 0437) Resp:  [16-20] 20 (06/28 0437) BP: (122-149)/(66-90) 148/84 (06/28 0437) SpO2:  [95 %-99 %] 99 % (06/28 0437)  Intake/Output from previous day:  Intake/Output Summary (Last 24 hours) at 05/03/2018 0722 Last data filed at 05/03/2018 0600 Gross per 24 hour  Intake 1920 ml  Output 3275 ml  Net -1355 ml     Labs: Recent Labs    05/02/18 0510 05/03/18 0514  HGB 14.3 12.5*   Recent Labs    05/02/18 0510 05/03/18 0514  WBC 18.7* 11.6*  RBC 4.23 3.76*  HCT 40.6 36.6*  PLT 170 148*   Recent Labs    05/02/18 0510 05/03/18 0514  NA 133* 136  K 4.5 3.4*  CL 98 100  CO2 26 29  BUN 11 10  CREATININE 0.88 0.86  GLUCOSE 218* 178*  CALCIUM 8.7* 8.4*   EXAM General - Patient is Alert and Oriented Extremity - Neurologically intact Intact pulses distally Dorsiflexion/Plantar flexion intact No cellulitis present Compartment soft Dressing/Incision - clean, dry, no drainage Motor Function - intact, moving foot and toes well on exam.   Past Medical History:  Diagnosis Date  . Arthritis   . CAD (coronary artery disease)    NSTEMI with DES to LAD; 100% nondominant RCA with collaterals - EF is normal.   . 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 Complication of anesthesia    slow to wake up   . Diabetes mellitus type II, non insulin dependent ([place])    DENIES ; STATES HIS PCP TOLD HIM " IM JUST A PRIME CANDIDATE  FOR IT"   . HLD (hyperlipidemia)   . Hydrocele in adult    confirmed by [place], elected to monitor after seeing urology.   . Hyperlipidemia   . Hypertension   . Skin abnormalities    affecting right hand thumb, pointer, and index fingers and palm; left hand thumb pointer and index finger. peeling/cracked/blister skin , warm to touch. scattered areas of superficial skin loss esp to R/L anterior index digits. glossy appearance and edema, reports itchiness, numbness, and pain, no drainage, occurs intermittently, ongoing for years, unsure of triggers, temp.relief w/steroids   . Tobacco abuse     Assessment/Plan: 2 Days Post-Op Procedure(s) (LRB): RIGHT TOTAL KNEE ARTHROPLASTY (Right) Active Problems:   H/O total knee replacement, right  Estimated body mass index is 33.52 kg/m as calculated from the following:   Height as of this encounter: 5\' 7"  (1.702 m).   Weight as of this encounter: 97.1 kg (214 lb). Advance diet Up with therapy  DVT Prophylaxis - Xarelto Weight-Bearing as tolerated   Continue with therapy today. Plan for DC home today with outpatient therapy if he is meeting goals. Supplement potassium. Robaxin stopped to help reduce hiccups. Follow up in office in 2 weeks.   Ardeen Jourdain, PA-C Orthopaedic Surgery 05/03/2018, 7:22 AM

## 2018-05-03 NOTE — Progress Notes (Signed)
OT Cancellation Note  Patient Details Name: Todd [name] MRN: [id number] DOB: 12-08-1960   Cancelled Treatment:    Reason Eval/Treat Not Completed: Fatigue/lethargy limiting ability to participate.  PT sleeping soundly. Will return  [place] 05/03/2018, [place], OTR/L [phone] 05/03/2018

## 2018-05-03 NOTE — Discharge Summary (Signed)
Physician Discharge Summary   Patient ID: Todd [name] MRN: [id number] DOB/AGE: 59-Dec-1962 9 y.o.  Admit date: 05/01/2018 Discharge date: 05/03/2018  Primary Diagnosis: Primary osteoarthritis right knee   Admission Diagnoses:  Past Medical History:  Diagnosis Date  . Arthritis   . CAD (coronary artery disease)    NSTEMI with DES to LAD; 100% nondominant RCA with collaterals - EF is normal.   . 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 Complication of anesthesia    slow to wake up   . Diabetes mellitus type II, non insulin dependent ([place])    DENIES ; STATES HIS PCP TOLD HIM " IM JUST A PRIME CANDIDATE FOR IT"   . HLD (hyperlipidemia)   . Hydrocele in adult    confirmed by [place], elected to monitor after seeing urology.   . Hyperlipidemia   . Hypertension   . Skin abnormalities    affecting right hand thumb, pointer, and index fingers and palm; left hand thumb pointer and index finger. peeling/cracked/blister skin , warm to touch. scattered areas of superficial skin loss esp to R/L anterior index digits. glossy appearance and edema, reports itchiness, numbness, and pain, no drainage, occurs intermittently, ongoing for years, unsure of triggers, temp.relief w/steroids   . Tobacco abuse    Discharge Diagnoses:   Active Problems:   H/O total knee replacement, right  Estimated body mass index is 33.52 kg/m as calculated from the following:   Height as of this encounter: _0  (1.702 m).   Weight as of this encounter: 97.1 kg (214 lb).  Procedure:  Procedure(s) (LRB): RIGHT TOTAL KNEE ARTHROPLASTY (Right)   Consults: None  HPI: Todd [name], 57 y.o. male, has a history of pain and functional disability in the right knee due to arthritis and has failed non-surgical conservative treatments for greater than 12 weeks to includeNSAID's and/or analgesics, corticosteriod injections, viscosupplementation injections, flexibility and strengthening excercises and activity  modification.  Onset of symptoms was gradual, starting 5 years ago with gradually worsening course since that time. The patient noted prior procedures on the knee to include  arthroscopy and menisectomy on the right knee(s).  Patient currently rates pain in the right knee(s) at 7 out of 10 with activity. Patient has night pain, worsening of pain with activity and weight bearing, pain that interferes with activities of daily living, pain with passive range of motion, crepitus and joint swelling.  Patient has evidence of periarticular osteophytes and joint space narrowing by imaging studies.There is no active infection.    Laboratory Data: Admission on 05/01/2018, Discharged on 05/03/2018  Component Date Value Ref Range Status  . Glucose-Capillary 05/01/2018 136* 70 - 99 mg/dL Final  . Comment 1 05/01/2018 Notify RN   Final  . Comment 2 05/01/2018 Document in Chart   Final  . Glucose-Capillary 05/01/2018 195* 70 - 99 mg/dL Final  . Comment 1 05/01/2018 Notify RN   Final  . WBC 05/02/2018 18.7* 4.0 - 10.5 K/uL Final  . RBC 05/02/2018 4.23  4.22 - 5.81 MIL/uL Final  . Hemoglobin 05/02/2018 14.3  13.0 - 17.0 g/dL Final  . HCT 05/02/2018 40.6  39.0 - 52.0 % Final  . MCV 05/02/2018 96.0  78.0 - 100.0 fL Final  . MCH 05/02/2018 33.8  26.0 - 34.0 pg Final  . MCHC 05/02/2018 35.2  30.0 - 36.0 g/dL Final  . RDW 05/02/2018 13.0  11.5 - 15.5 % Final  . Platelets 05/02/2018 170  150 -  400 K/uL Final   Performed at [facility] [address], [place], [place] [address]  . Sodium 05/02/2018 133* 135 - 145 mmol/L Final  . Potassium 05/02/2018 4.5  3.5 - 5.1 mmol/L Final  . Chloride 05/02/2018 98  98 - 111 mmol/L Final   Please note change in reference range.  . CO2 05/02/2018 26  22 - 32 mmol/L Final  . Glucose, Bld 05/02/2018 218* 70 - 99 mg/dL Final   Please note change in reference range.  . BUN 05/02/2018 11  6 - 20 mg/dL Final   Please note change in reference range.  .  Creatinine, Ser 05/02/2018 0.88  0.61 - 1.24 mg/dL Final  . Calcium 05/02/2018 8.7* 8.9 - 10.3 mg/dL Final  . GFR calc non Af Amer 05/02/2018 >60  >60 mL/min Final  . GFR calc Af Amer 05/02/2018 >60  >60 mL/min Final   Comment: (NOTE) The eGFR has been calculated using the CKD EPI equation. This calculation has not been validated in all clinical situations. eGFR's persistently <60 mL/min signify possible Chronic Kidney Disease.   Georgiann Hahn gap 05/02/2018 9  5 - 15 Final   Performed at [facility], [place] [address], [place], [place] [address]  . Hgb A1c MFr Bld 05/01/2018 6.9* 4.8 - 5.6 % Final   Comment: (NOTE) Pre diabetes:          5.7%-6.4% Diabetes:              >6.4% Glycemic control for   <7.0% adults with diabetes   . Mean Plasma Glucose 05/01/2018 151.33  mg/dL Final   Performed at [facility] [address], [place], [place] [address]  . Glucose-Capillary 05/01/2018 264* 70 - 99 mg/dL Final  . Glucose-Capillary 05/02/2018 178* 70 - 99 mg/dL Final  . Glucose-Capillary 05/02/2018 286* 70 - 99 mg/dL Final  . Glucose-Capillary 05/02/2018 181* 70 - 99 mg/dL Final  . WBC 05/03/2018 11.6* 4.0 - 10.5 K/uL Final  . RBC 05/03/2018 3.76* 4.22 - 5.81 MIL/uL Final  . Hemoglobin 05/03/2018 12.5* 13.0 - 17.0 g/dL Final  . HCT 05/03/2018 36.6* 39.0 - 52.0 % Final  . MCV 05/03/2018 97.3  78.0 - 100.0 fL Final  . MCH 05/03/2018 33.2  26.0 - 34.0 pg Final  . MCHC 05/03/2018 34.2  30.0 - 36.0 g/dL Final  . RDW 05/03/2018 13.1  11.5 - 15.5 % Final  . Platelets 05/03/2018 148* 150 - 400 K/uL Final   Performed at [facility], [place] [address] [place], [place] [address]  . Sodium 05/03/2018 136  135 - 145 mmol/L Final  . Potassium 05/03/2018 3.4* 3.5 - 5.1 mmol/L Final   Comment: DELTA CHECK NOTED REPEATED TO VERIFY   . Chloride 05/03/2018 100  98 - 111 mmol/L Final   Please note change in reference range.  . CO2 05/03/2018 29  22 - 32 mmol/L  Final  . Glucose, Bld 05/03/2018 178* 70 - 99 mg/dL Final   Please note change in reference range.  . BUN 05/03/2018 10  6 - 20 mg/dL Final   Please note change in reference range.  . Creatinine, Ser 05/03/2018 0.86  0.61 - 1.24 mg/dL Final  . Calcium 05/03/2018 8.4* 8.9 - 10.3 mg/dL Final  . GFR calc non Af Amer 05/03/2018 >60  >60 mL/min Final  . GFR calc Af Amer 05/03/2018 >60  >60 mL/min Final   Comment: (NOTE) The eGFR has been calculated using  the CKD EPI equation. This calculation has not been validated in all clinical situations. eGFR's persistently <60 mL/min signify possible Chronic Kidney Disease.   Georgiann Hahn gap 05/03/2018 7  5 - 15 Final   Performed at [facility], [place] [address] [place], [place] [address]  . Glucose-Capillary 05/03/2018 149* 70 - 99 mg/dL Final  Hospital Outpatient Visit on 04/24/2018  Component Date Value Ref Range Status  . Hgb A1c MFr Bld 04/24/2018 7.0* 4.8 - 5.6 % Final   Comment: (NOTE) Pre diabetes:          5.7%-6.4% Diabetes:              >6.4% Glycemic control for   <7.0% adults with diabetes   . Mean Plasma Glucose 04/24/2018 154.2  mg/dL Final   Performed at [facility] [address], [place], [place] [address]  . aPTT 04/24/2018 28  24 - 36 seconds Final   Performed at [facility], [place] [address], [place], [place] [address]  . WBC 04/24/2018 8.8  4.0 - 10.5 K/uL Final  . RBC 04/24/2018 4.94  4.22 - 5.81 MIL/uL Final  . Hemoglobin 04/24/2018 16.7  13.0 - 17.0 g/dL Final  . HCT 04/24/2018 47.5  39.0 - 52.0 % Final  . MCV 04/24/2018 96.2  78.0 - 100.0 fL Final  . MCH 04/24/2018 33.8  26.0 - 34.0 pg Final  . MCHC 04/24/2018 35.2  30.0 - 36.0 g/dL Final  . RDW 04/24/2018 13.1  11.5 - 15.5 % Final  . Platelets 04/24/2018 155  150 - 400 K/uL Final   Performed at [facility], [place] [address], [place], [place] [address]  . Sodium 04/24/2018 138  135 - 145 mmol/L Final  .  Potassium 04/24/2018 3.9  3.5 - 5.1 mmol/L Final  . Chloride 04/24/2018 103  101 - 111 mmol/L Final  . CO2 04/24/2018 26  22 - 32 mmol/L Final  . Glucose, Bld 04/24/2018 119* 65 - 99 mg/dL Final  . BUN 04/24/2018 8  6 - 20 mg/dL Final  . Creatinine, Ser 04/24/2018 0.82  0.61 - 1.24 mg/dL Final  . Calcium 04/24/2018 9.0  8.9 - 10.3 mg/dL Final  . Total Protein 04/24/2018 7.1  6.5 - 8.1 g/dL Final  . Albumin 04/24/2018 4.2  3.5 - 5.0 g/dL Final  . AST 04/24/2018 26  15 - 41 U/L Final  . ALT 04/24/2018 33  17 - 63 U/L Final  . Alkaline Phosphatase 04/24/2018 62  38 - 126 U/L Final  . Total Bilirubin 04/24/2018 1.3* 0.3 - 1.2 mg/dL Final  . GFR calc non Af Amer 04/24/2018 >60  >60 mL/min Final  . GFR calc Af Amer 04/24/2018 >60  >60 mL/min Final   Comment: (NOTE) The eGFR has been calculated using the CKD EPI equation. This calculation has not been validated in all clinical situations. eGFR's persistently <60 mL/min signify possible Chronic Kidney Disease.   Georgiann Hahn gap 04/24/2018 9  5 - 15 Final   Performed at [facility], [place] [address], [place], [place] [address]  . Prothrombin Time 04/24/2018 12.7  11.4 - 15.2 seconds Final  . INR 04/24/2018 0.96   Final   Performed at [facility], [place] [address] [place], [place] [address]  . ABO/RH(D) 04/24/2018 A NEG   Final  . Antibody Screen 04/24/2018 NEG   Final  . Sample Expiration 04/24/2018 05/04/2018   Final  . Extend  sample reason 04/24/2018    Final                   Value:NO TRANSFUSIONS OR PREGNANCY IN THE PAST 3 MONTHS Performed at [facility] [address], [place], [place] [address]   . MRSA, PCR 04/24/2018 NEGATIVE  NEGATIVE Final  . Staphylococcus aureus 04/24/2018 POSITIVE* NEGATIVE Final   Comment: (NOTE) The Xpert SA Assay (FDA approved for NASAL specimens in patients 45 years of age and older), is one component of a comprehensive surveillance program. It is not  intended to diagnose infection nor to guide or monitor treatment. Performed at [facility], [place] [address], [place], [place] [address]       Hospital Course: Todd [name] is a 57 y.o. who was admitted to [facility]. They were brought to the operating room on 05/01/2018 and underwent Procedure(s): RIGHT TOTAL KNEE ARTHROPLASTY.  Patient tolerated the procedure well and was later transferred to the recovery room and then to the orthopaedic floor for postoperative care.  They were given PO and IV analgesics for pain control following their surgery.  They were given 24 hours of postoperative antibiotics of  Anti-infectives (From admission, onward)   Start     Dose/Rate Route Frequency Ordered Stop   05/01/18 2300  vancomycin (VANCOCIN) IVPB 1000 mg/200 mL premix     1,000 mg 200 mL/hr over 60 Minutes Intravenous Every 12 hours 05/01/18 1635 05/02/18 0100   05/01/18 1420  polymyxin B 500,000 Units, bacitracin 50,000 Units in sodium chloride 0.9 % 500 mL irrigation  Status:  Discontinued       As needed 05/01/18 1420 05/01/18 1506   05/01/18 1015  vancomycin (VANCOCIN) IVPB 1000 mg/200 mL premix     1,000 mg 200 mL/hr over 60 Minutes Intravenous On call to O.R. 05/01/18 1014 05/01/18 1204     and started on DVT prophylaxis in the form of Xarelto.   PT and OT were ordered for total joint protocol.  Discharge planning consulted to help with postop disposition and equipment needs.  Patient had a fair night on the evening of surgery.  They started to get up OOB with therapy on day one. Hemovac drain was pulled without difficulty.  Continued to work with therapy into day two.  Dressing was changed on day two and the incision was clean and dry after some initial bloody drainage from the hemovac site. The patient had progressed with therapy and meeting their goals.  Incision was healing well.  Patient was seen in rounds and was ready to go home.   Diet: Cardiac diet and  Diabetic diet Activity:WBAT Follow-up:in 2 weeks Disposition - Home Discharged Condition: stable   Discharge Instructions    Call MD / Call 911   Complete by:  As directed    If you experience chest pain or shortness of breath, CALL 911 and be transported to the hospital emergency room.  If you develope a fever above 101 F, pus (white drainage) or increased drainage or redness at the wound, or calf pain, call your surgeon's office.   Constipation Prevention   Complete by:  As directed    Drink plenty of fluids.  Prune juice may be helpful.  You may use a stool softener, such as Colace (over the counter) 100 mg twice a day.  Use MiraLax (over the counter) for constipation as needed.   Diet - low sodium heart healthy   Complete by:  As  directed    Diet Carb Modified   Complete by:  As directed    Discharge instructions   Complete by:  As directed    Dr. [name] [facility] [address] [place], [place] [address] [phone]  TOTAL KNEE REPLACEMENT POSTOPERATIVE DIRECTIONS  Knee Rehabilitation, Guidelines Following Surgery  Results after knee surgery are often greatly improved when you follow the exercise, range of motion and muscle strengthening exercises prescribed by your doctor. Safety measures are also important to protect the knee from further injury. Any time any of these exercises cause you to have increased pain or swelling in your knee joint, decrease the amount until you are comfortable again and slowly increase them. If you have problems or questions, call your caregiver or physical therapist for advice.   HOME CARE INSTRUCTIONS  Remove items at home which could result in a fall. This includes throw rugs or furniture in walking pathways.  ICE to the affected knee every three hours for 30 minutes at a time and then as needed for pain and swelling.  Continue to use ice on the knee for pain and swelling from surgery. You may notice swelling that will  progress down to the foot and ankle.  This is normal after surgery.  Elevate the leg when you are not up walking on it.   Continue to use the breathing machine which will help keep your temperature down.  It is common for your temperature to cycle up and down following surgery, especially at night when you are not up moving around and exerting yourself.  The breathing machine keeps your lungs expanded and your temperature down. Do not place pillow under knee, focus on keeping the knee straight while resting  DIET You may resume your previous home diet once your are discharged from the hospital.  DRESSING / WOUND CARE / SHOWERING You may change your dressing every day with sterile gauze.  Please use good hand washing techniques before changing the dressing.  Do not use any lotions or creams on the incision until instructed by your surgeon. You may start showering once you are discharged home but do not submerge the incision under water. Just pat the incision dry and apply a dry gauze dressing on daily. Change the surgical dressing daily and reapply a dry dressing each time.  ACTIVITY Walk with your walker as instructed. Use walker as long as suggested by your caregivers. Avoid periods of inactivity such as sitting longer than an hour when not asleep. This helps prevent blood clots.  You may resume a sexual relationship in one month or when given the OK by your doctor.  You may return to work once you are cleared by your doctor.  Do not drive a car for 6 weeks or until released by you surgeon.  Do not drive while taking narcotics.  WEIGHT BEARING Weight bearing as tolerated with assist device (walker, cane, etc) as directed, use it as long as suggested by your surgeon or therapist, typically at least 4-6 weeks.  POSTOPERATIVE CONSTIPATION PROTOCOL Constipation - defined medically as fewer than three stools per week and severe constipation as less than one stool per week.  One of the most  common issues patients have following surgery is constipation.  Even if you have a regular bowel pattern at home, your normal regimen is likely to be disrupted due to multiple reasons following surgery.  Combination of anesthesia, postoperative narcotics, change in appetite and fluid intake all can affect  your bowels.  In order to avoid complications following surgery, here are some recommendations in order to help you during your recovery period.  Colace (docusate) - Pick up an over-the-counter form of Colace or another stool softener and take twice a day as long as you are requiring postoperative pain medications.  Take with a full glass of water daily.  If you experience loose stools or diarrhea, hold the colace until you stool forms back up.  If your symptoms do not get better within 1 week or if they get worse, check with your doctor.  Dulcolax (bisacodyl) - Pick up over-the-counter and take as directed by the product packaging as needed to assist with the movement of your bowels.  Take with a full glass of water.  Use this product as needed if not relieved by Colace only.   MiraLax (polyethylene glycol) - Pick up over-the-counter to have on hand.  MiraLax is a solution that will increase the amount of water in your bowels to assist with bowel movements.  Take as directed and can mix with a glass of water, juice, soda, coffee, or tea.  Take if you go more than two days without a movement. Do not use MiraLax more than once per day. Call your doctor if you are still constipated or irregular after using this medication for 7 days in a row.  If you continue to have problems with postoperative constipation, please contact the office for further assistance and recommendations.  If you experience "the worst abdominal pain ever" or develop nausea or vomiting, please contact the office immediatly for further recommendations for treatment.  ITCHING  If you experience itching with your medications, try taking  only a single pain pill, or even half a pain pill at a time.  You can also use Benadryl over the counter for itching or also to help with sleep.   TED HOSE STOCKINGS Wear the elastic stockings on both legs for three weeks following surgery during the day but you may remove then at night for sleeping.  MEDICATIONS See your medication summary on the "After Visit Summary" that the nursing staff will review with you prior to discharge.  You may have some home medications which will be placed on hold until you complete the course of blood thinner medication.  It is important for you to complete the blood thinner medication as prescribed by your surgeon.  Continue your approved medications as instructed at time of discharge.  PRECAUTIONS If you experience chest pain or shortness of breath - call 911 immediately for transfer to the hospital emergency department.  If you develop a fever greater that 101 F, purulent drainage from wound, increased redness or drainage from wound, foul odor from the wound/dressing, or calf pain - CONTACT YOUR SURGEON.                                                   FOLLOW-UP APPOINTMENTS Make sure you keep all of your appointments after your operation with your surgeon and caregivers. You should call the office at the above phone number and make an appointment for approximately two weeks after the date of your surgery or on the date instructed by your surgeon outlined in the "After Visit Summary".   RANGE OF MOTION AND STRENGTHENING EXERCISES  Rehabilitation of the knee is important following a knee  injury or an operation. After just a few days of immobilization, the muscles of the thigh which control the knee become weakened and shrink (atrophy). Knee exercises are designed to build up the tone and strength of the thigh muscles and to improve knee motion. Often times heat used for twenty to thirty minutes before working out will loosen up your tissues and help with improving  the range of motion but do not use heat for the first two weeks following surgery. These exercises can be done on a training (exercise) mat, on the floor, on a table or on a bed. Use what ever works the best and is most comfortable for you Knee exercises include:  Leg Lifts - While your knee is still immobilized in a splint or cast, you can do straight leg raises. Lift the leg to 60 degrees, hold for 3 sec, and slowly lower the leg. Repeat 10-20 times 2-3 times daily. Perform this exercise against resistance later as your knee gets better.  Quad and Hamstring Sets - Tighten up the muscle on the front of the thigh (Quad) and hold for 5-10 sec. Repeat this 10-20 times hourly. Hamstring sets are done by pushing the foot backward against an object and holding for 5-10 sec. Repeat as with quad sets.  Leg Slides: Lying on your back, slowly slide your foot toward your buttocks, bending your knee up off the floor (only go as far as is comfortable). Then slowly slide your foot back down until your leg is flat on the floor again. Angel Wings: Lying on your back spread your legs to the side as far apart as you can without causing discomfort.  A rehabilitation program following serious knee injuries can speed recovery and prevent re-injury in the future due to weakened muscles. Contact your doctor or a physical therapist for more information on knee rehabilitation.   IF YOU ARE TRANSFERRED TO A SKILLED REHAB FACILITY If the patient is transferred to a skilled rehab facility following release from the hospital, a list of the current medications will be sent to the facility for the patient to continue.  When discharged from the skilled rehab facility, please have the facility set up the patient's [place] prior to being released. Also, the skilled facility will be responsible for providing the patient with their medications at time of release from the facility to include their pain medication, the  muscle relaxants, and their blood thinner medication. If the patient is still at the rehab facility at time of the two week follow up appointment, the skilled rehab facility will also need to assist the patient in arranging follow up appointment in our office and any transportation needs.  MAKE SURE YOU:  Understand these instructions.  Get help right away if you are not doing well or get worse.    Pick up stool softner and laxative for home use following surgery while on pain medications. Do not submerge incision under water. Please use good hand washing techniques while changing dressing each day. May shower starting three days after surgery. Please use a clean towel to pat the incision dry following showers. Continue to use ice for pain and swelling after surgery. Do not use any lotions or creams on the incision until instructed by your surgeon.   Increase activity slowly as tolerated   Complete by:  As directed      Allergies as of 05/03/2018      Reactions   Dust Mite Extract Anaphylaxis  Other Swelling, Rash, Other (See Comments)   grass   Aspirin Nausea Only, Other (See Comments)   Patient can tolerate 81 mg ONLY   Latex Other (See Comments)   Unknown   Ibuprofen Nausea And Vomiting, Other (See Comments)   Causes Severe headaches      Medication List    STOP taking these medications   aspirin EC 81 MG tablet   vitamin E 400 UNIT capsule     TAKE these medications   alum & mag hydroxide-simeth 200-200-20 MG/5ML suspension Commonly known as:  MAALOX/MYLANTA Take 30 mLs by mouth every 6 (six) hours as needed for indigestion or heartburn.   atorvastatin 80 MG tablet Commonly known as:  LIPITOR Take 80 mg by mouth every evening.   buPROPion 150 MG 12 hr tablet Commonly known as:  WELLBUTRIN SR Take 1 tablet (150 mg total) by mouth 2 (two) times daily.   calcium carbonate 500 MG chewable tablet Commonly known as:  TUMS - dosed in mg elemental calcium [name] 2  tablets by mouth daily as needed for indigestion or heartburn.   carvedilol 12.5 MG tablet Commonly known as:  COREG TAKE ONE TABLET BY MOUTH TWICE A DAY WITH MEALS   diphenhydrAMINE 25 MG tablet Commonly known as:  BENADRYL Take 1 tablet (25 mg total) by mouth every 6 (six) hours as needed for itching or allergies (Rash).   docusate sodium 100 MG capsule Commonly known as:  COLACE Take 1 capsule (100 mg total) by mouth 2 (two) times daily.   EPINEPHrine 0.3 mg/0.3 mL Soaj injection Commonly known as:  EPIPEN 2-PAK Inject 0.3 mLs (0.3 mg total) into the muscle once as needed (for severe allergic reaction). CAll 911 immediately if you have to use this medicine   famotidine 20 MG tablet Commonly known as:  PEPCID TAKE 1 TABLET BY MOUTH DAILY AS NEEDED FOR HEARTBURN OR INDIGESTION.   gabapentin 300 MG capsule Commonly known as:  NEURONTIN Take 1 capsule (300 mg total) by mouth 3 (three) times daily.   HYDROcodone-acetaminophen 7.5-325 MG tablet Commonly known as:  NORCO Take 1-2 tablets by mouth every 6 (six) hours as needed for moderate pain.   losartan 50 MG tablet Commonly known as:  COZAAR TAKE ONE TABLET BY MOUTH ONE TIME DAILY   nitroGLYCERIN 0.4 MG SL tablet Commonly known as:  NITROSTAT Place 1 tablet (0.4 mg total) under the tongue every 5 (five) minutes as needed for chest pain.   polyethylene glycol packet Commonly known as:  MIRALAX / GLYCOLAX Take 17 g by mouth daily as needed for mild constipation.   potassium chloride SA 15 MEQ tablet Commonly known as:  KLOR-CON M15 Take 2 tablets (30 mEq total) by mouth 2 (two) times daily.   rivaroxaban 10 MG Tabs tablet Commonly known as:  XARELTO Take 1 tablet (10 mg total) by mouth daily with breakfast.   triamcinolone cream 0.1 % Commonly known as:  KENALOG Apply 1 application topically 2 (two) times daily. What changed:    when to take this  reasons to take this      Follow-up Information    [name], MD. Schedule an appointment as soon as possible for a visit in 2 week(s).   Specialty:  Orthopedic Surgery Contact information: [address] [place] [address] [phone]           Signed: Ardeen Jourdain, PA-C Orthopaedic Surgery 05/03/2018, 2:38 PM

## 2018-05-03 NOTE — Progress Notes (Signed)
Physical Therapy Treatment Patient Details Name: Todd [name] MRN: [id number] DOB: [birth year] Today's Date: 05/03/2018    History of Present Illness s/p R TKA    PT Comments    Pt progressing well; pt son and dtr present for session and feel comfortable with d/c today; all areas addressed this session, RN made aware  Follow Up Recommendations  Follow surgeon's recommendation for DC plan and follow-up therapies     Equipment Recommendations  Rolling walker with 5" wheels    Recommendations for Other Services       Precautions / Restrictions Precautions Precautions: Knee Precaution Comments: reviewed no pillow under knee Required Braces or Orthoses: Knee Immobilizer - Right Restrictions Weight Bearing Restrictions: No Other Position/Activity Restrictions: WBAT    Mobility  Bed Mobility Overal bed mobility: Needs Assistance Bed Mobility: Supine to Sit     Supine to sit: Supervision        Transfers Overall transfer level: Needs assistance Equipment used: Rolling walker (2 wheeled) Transfers: Sit to/from Stand Sit to Stand: Supervision         General transfer comment: cues for hand placement and RLE position  Ambulation/Gait Ambulation/Gait assistance: Supervision Gait Distance (Feet): 150 Feet Assistive device: Rolling walker (2 wheeled) Gait Pattern/deviations: Step-to pattern;Decreased step length - right;Decreased step length - left;Antalgic Gait velocity: decr   General Gait Details: verbal cues for sequence and use of UEs for better pain control with WBing op LE   Stairs Stairs: Yes Stairs assistance: Min assist Stair Management: No rails;Step to pattern;With walker;Backwards Number of Stairs: 2 General stair comments: cues for technique and sequence   Wheelchair Mobility    Modified Rankin (Stroke Patients Only)       Balance Overall balance assessment: Needs assistance   Sitting balance-Leahy Scale: Good     Standing balance  support: Bilateral upper extremity supported;Single extremity supported;During functional activity Standing balance-Leahy Scale: Poor Standing balance comment: reliant on UEs for safe static stand                            Cognition Arousal/Alertness: Awake/alert Behavior During Therapy: WFL for tasks assessed/performed Overall Cognitive Status: Within Functional Limits for tasks assessed                                        Exercises Total Joint Exercises Ankle Circles/Pumps: AROM;Both;10 reps;Supine Quad Sets: AROM;Right;Supine;10 reps Heel Slides: AAROM;Right;10 reps;Supine Straight Leg Raises: AAROM;Right;Supine Goniometric ROM: ~ 10* to 60* right knee flexion    General Comments        Pertinent Vitals/Pain Pain Assessment: 0-10 Pain Score: 4  Pain Location: R knee with weight bearing Pain Descriptors / Indicators: Aching Pain Intervention(s): Limited activity within patient's tolerance;Monitored during session;Premedicated before session;Ice applied    Home Living                      Prior Function            PT Goals (current goals can now be found in the care plan section) Acute Rehab PT Goals Patient Stated Goal: less pain; return to PLOF, work on cars PT Goal Formulation: With patient/family Time For Goal Achievement: 05/09/18 Potential to Achieve Goals: Good Progress towards PT goals: Progressing toward goals    Frequency    7X/week      PT Plan  Current plan remains appropriate    Co-evaluation              AM-PAC PT "6 Clicks" Daily Activity  Outcome Measure  Difficulty turning over in bed (including adjusting bedclothes, sheets and blankets)?: A Lot Difficulty moving from lying on back to sitting on the side of the bed? : A Lot Difficulty sitting down on and standing up from a chair with arms (e.g., wheelchair, bedside commode, etc,.)?: A Little Help needed moving to and from a bed to chair  (including a wheelchair)?: A Little Help needed walking in hospital room?: A Little Help needed climbing 3-5 steps with a railing? : A Little 6 Click Score: 16    End of Session Equipment Utilized During Treatment: Gait belt;Right knee immobilizer Activity Tolerance: Patient tolerated treatment well Patient left: in bed;with call bell/phone within reach;with family/visitor present Nurse Communication: Other (comment)(ready for d/c) PT Visit Diagnosis: Muscle weakness (generalized) (M62.81);Difficulty in walking, not elsewhere classified (R26.2);Pain Pain - Right/Left: Right Pain - part of body: Knee     Time: 0511-0211 PT Time Calculation (min) (ACUTE ONLY): 35 min  Charges:  $Gait Training: 8-22 mins $Therapeutic Exercise: 8-22 mins                    G CodesKenyon Ana, PT Pager: [phone] 05/03/2018    Kenyon Ana 05/03/2018, 10:32 AM

## 2018-05-03 NOTE — Progress Notes (Signed)
DC plan: Pt has outpatient therapy appointment scheduled. Orders for RW and 3in1 received. [facility] alerted of need for DME. Marney Doctor RN,BSN,NCM [phone]

## 2018-05-06 ENCOUNTER — Other Ambulatory Visit (HOSPITAL_COMMUNITY): Payer: Self-pay | Admitting: Orthopedic Surgery

## 2018-05-06 ENCOUNTER — Observation Stay (HOSPITAL_COMMUNITY)
Admission: EM | Admit: 2018-05-06 | Discharge: 2018-05-07 | Disposition: A | Discharge status: Home / Self Care | Payer: BLUE CROSS/BLUE SHIELD | Attending: Internal Medicine | Admitting: Internal Medicine

## 2018-05-06 ENCOUNTER — Emergency Department (HOSPITAL_COMMUNITY): Payer: BLUE CROSS/BLUE SHIELD

## 2018-05-06 ENCOUNTER — Ambulatory Visit (HOSPITAL_COMMUNITY)
Admission: RE | Admit: 2018-05-06 | Discharge: 2018-05-06 | Disposition: A | Discharge status: Home / Self Care | Payer: BLUE CROSS/BLUE SHIELD | Source: Ambulatory Visit | Attending: Cardiovascular Disease | Admitting: Cardiovascular Disease

## 2018-05-06 ENCOUNTER — Other Ambulatory Visit: Payer: Self-pay

## 2018-05-06 ENCOUNTER — Encounter (HOSPITAL_COMMUNITY): Payer: Self-pay | Admitting: *Deleted

## 2018-05-06 DIAGNOSIS — M79661 Pain in right lower leg: Secondary | ICD-10-CM

## 2018-05-06 DIAGNOSIS — K920 Hematemesis: Secondary | ICD-10-CM

## 2018-05-06 DIAGNOSIS — D62 Acute posthemorrhagic anemia: Secondary | ICD-10-CM | POA: Diagnosis not present

## 2018-05-06 DIAGNOSIS — E785 Hyperlipidemia, unspecified: Secondary | ICD-10-CM | POA: Insufficient documentation

## 2018-05-06 DIAGNOSIS — E119 Type 2 diabetes mellitus without complications: Secondary | ICD-10-CM | POA: Diagnosis not present

## 2018-05-06 DIAGNOSIS — K2211 Ulcer of esophagus with bleeding: Secondary | ICD-10-CM | POA: Diagnosis not present

## 2018-05-06 DIAGNOSIS — F17290 Nicotine dependence, other tobacco product, uncomplicated: Secondary | ICD-10-CM | POA: Insufficient documentation

## 2018-05-06 DIAGNOSIS — M79662 Pain in left lower leg: Secondary | ICD-10-CM | POA: Diagnosis not present

## 2018-05-06 DIAGNOSIS — M79604 Pain in right leg: Secondary | ICD-10-CM | POA: Insufficient documentation

## 2018-05-06 DIAGNOSIS — Z85828 Personal history of other malignant neoplasm of skin: Secondary | ICD-10-CM | POA: Diagnosis not present

## 2018-05-06 DIAGNOSIS — F17210 Nicotine dependence, cigarettes, uncomplicated: Secondary | ICD-10-CM | POA: Insufficient documentation

## 2018-05-06 DIAGNOSIS — M7989 Other specified soft tissue disorders: Secondary | ICD-10-CM | POA: Diagnosis not present

## 2018-05-06 DIAGNOSIS — K2100 Gastro-esophageal reflux disease with esophagitis, without bleeding: Secondary | ICD-10-CM

## 2018-05-06 DIAGNOSIS — M79605 Pain in left leg: Secondary | ICD-10-CM | POA: Diagnosis not present

## 2018-05-06 DIAGNOSIS — I252 Old myocardial infarction: Secondary | ICD-10-CM | POA: Insufficient documentation

## 2018-05-06 DIAGNOSIS — E7800 Pure hypercholesterolemia, unspecified: Secondary | ICD-10-CM | POA: Diagnosis not present

## 2018-05-06 DIAGNOSIS — Z7901 Long term (current) use of anticoagulants: Secondary | ICD-10-CM | POA: Insufficient documentation

## 2018-05-06 DIAGNOSIS — Z955 Presence of coronary angioplasty implant and graft: Secondary | ICD-10-CM | POA: Diagnosis not present

## 2018-05-06 DIAGNOSIS — I10 Essential (primary) hypertension: Secondary | ICD-10-CM | POA: Insufficient documentation

## 2018-05-06 DIAGNOSIS — Z79899 Other long term (current) drug therapy: Secondary | ICD-10-CM | POA: Insufficient documentation

## 2018-05-06 DIAGNOSIS — Z96651 Presence of right artificial knee joint: Secondary | ICD-10-CM | POA: Insufficient documentation

## 2018-05-06 DIAGNOSIS — Z794 Long term (current) use of insulin: Secondary | ICD-10-CM

## 2018-05-06 DIAGNOSIS — F17200 Nicotine dependence, unspecified, uncomplicated: Secondary | ICD-10-CM | POA: Diagnosis not present

## 2018-05-06 DIAGNOSIS — K5900 Constipation, unspecified: Secondary | ICD-10-CM | POA: Insufficient documentation

## 2018-05-06 DIAGNOSIS — Z720 Tobacco use: Secondary | ICD-10-CM | POA: Diagnosis present

## 2018-05-06 DIAGNOSIS — E1140 Type 2 diabetes mellitus with diabetic neuropathy, unspecified: Secondary | ICD-10-CM | POA: Insufficient documentation

## 2018-05-06 DIAGNOSIS — Z8711 Personal history of peptic ulcer disease: Secondary | ICD-10-CM | POA: Diagnosis not present

## 2018-05-06 DIAGNOSIS — Z7982 Long term (current) use of aspirin: Secondary | ICD-10-CM | POA: Diagnosis not present

## 2018-05-06 DIAGNOSIS — R066 Hiccough: Secondary | ICD-10-CM | POA: Insufficient documentation

## 2018-05-06 DIAGNOSIS — I152 Hypertension secondary to endocrine disorders: Secondary | ICD-10-CM | POA: Diagnosis present

## 2018-05-06 DIAGNOSIS — I2510 Atherosclerotic heart disease of native coronary artery without angina pectoris: Secondary | ICD-10-CM | POA: Diagnosis not present

## 2018-05-06 LAB — COMPREHENSIVE METABOLIC PANEL
ALT: 35 U/L (ref 0–44)
AST: 26 U/L (ref 15–41)
Albumin: 3.1 g/dL — ABNORMAL LOW (ref 3.5–5.0)
Alkaline Phosphatase: 50 U/L (ref 38–126)
Anion gap: 10 (ref 5–15)
BUN: 10 mg/dL (ref 6–20)
CHLORIDE: 97 mmol/L — AB (ref 98–111)
CO2: 26 mmol/L (ref 22–32)
CREATININE: 0.67 mg/dL (ref 0.61–1.24)
Calcium: 8.5 mg/dL — ABNORMAL LOW (ref 8.9–10.3)
Glucose, Bld: 183 mg/dL — ABNORMAL HIGH (ref 70–99)
POTASSIUM: 3.7 mmol/L (ref 3.5–5.1)
Sodium: 133 mmol/L — ABNORMAL LOW (ref 135–145)
TOTAL PROTEIN: 6.4 g/dL — AB (ref 6.5–8.1)
Total Bilirubin: 0.6 mg/dL (ref 0.3–1.2)

## 2018-05-06 LAB — CBC WITH DIFFERENTIAL/PLATELET
BASOS ABS: 0 10*3/uL (ref 0.0–0.1)
BASOS PCT: 0 %
EOS ABS: 0.2 10*3/uL (ref 0.0–0.7)
Eosinophils Relative: 2 %
HEMATOCRIT: 31 % — AB (ref 39.0–52.0)
Hemoglobin: 10.9 g/dL — ABNORMAL LOW (ref 13.0–17.0)
Lymphocytes Relative: 14 %
Lymphs Abs: 1.5 10*3/uL (ref 0.7–4.0)
MCH: 33.1 pg (ref 26.0–34.0)
MCHC: 35.2 g/dL (ref 30.0–36.0)
MCV: 94.2 fL (ref 78.0–100.0)
MONO ABS: 1.4 10*3/uL — AB (ref 0.1–1.0)
MONOS PCT: 13 %
NEUTROS ABS: 7.5 10*3/uL (ref 1.7–7.7)
Neutrophils Relative %: 71 %
PLATELETS: 241 10*3/uL (ref 150–400)
RBC: 3.29 MIL/uL — ABNORMAL LOW (ref 4.22–5.81)
RDW: 12.6 % (ref 11.5–15.5)
WBC: 10.6 10*3/uL — ABNORMAL HIGH (ref 4.0–10.5)

## 2018-05-06 LAB — LIPASE, BLOOD: Lipase: 22 U/L (ref 11–51)

## 2018-05-06 LAB — CBC
HCT: 31.6 % — ABNORMAL LOW (ref 39.0–52.0)
Hemoglobin: 11.2 g/dL — ABNORMAL LOW (ref 13.0–17.0)
MCH: 33.4 pg (ref 26.0–34.0)
MCHC: 35.4 g/dL (ref 30.0–36.0)
MCV: 94.3 fL (ref 78.0–100.0)
PLATELETS: 242 10*3/uL (ref 150–400)
RBC: 3.35 MIL/uL — AB (ref 4.22–5.81)
RDW: 12.8 % (ref 11.5–15.5)
WBC: 10.3 10*3/uL (ref 4.0–10.5)

## 2018-05-06 LAB — GLUCOSE, CAPILLARY: GLUCOSE-CAPILLARY: 167 mg/dL — AB (ref 70–99)

## 2018-05-06 LAB — ETHANOL

## 2018-05-06 MED ORDER — FAMOTIDINE IN NACL 20-0.9 MG/50ML-% IV SOLN
20.0000 mg | Freq: Once | INTRAVENOUS | Status: AC
  Administered 2018-05-06: 20 mg via INTRAVENOUS
  Filled 2018-05-06: qty 50

## 2018-05-06 MED ORDER — BUPROPION HCL ER (SR) 150 MG PO TB12
ORAL_TABLET | ORAL | Status: AC
  Filled 2018-05-06: qty 1

## 2018-05-06 MED ORDER — CARVEDILOL 12.5 MG PO TABS
12.5000 mg | ORAL_TABLET | Freq: Two times a day (BID) | ORAL | Status: DC
  Administered 2018-05-07: 12.5 mg via ORAL
  Filled 2018-05-06: qty 1

## 2018-05-06 MED ORDER — SODIUM CHLORIDE 0.9 % IV BOLUS
1000.0000 mL | Freq: Once | INTRAVENOUS | Status: AC
  Administered 2018-05-06: 1000 mL via INTRAVENOUS

## 2018-05-06 MED ORDER — ACETAMINOPHEN 650 MG RE SUPP: 650.0000 mg | Freq: Four times a day (QID) | RECTAL | Status: DC | PRN

## 2018-05-06 MED ORDER — HYDROCODONE-ACETAMINOPHEN 7.5-325 MG PO TABS
1.0000 | ORAL_TABLET | Freq: Four times a day (QID) | ORAL | Status: DC | PRN
  Administered 2018-05-06 – 2018-05-07 (×3): 2 via ORAL
  Filled 2018-05-06 (×3): qty 2

## 2018-05-06 MED ORDER — SODIUM CHLORIDE 0.9 % IV SOLN
80.0000 mg | Freq: Once | INTRAVENOUS | Status: AC
  Administered 2018-05-06: 80 mg via INTRAVENOUS
  Filled 2018-05-06: qty 80

## 2018-05-06 MED ORDER — ONDANSETRON HCL 4 MG PO TABS: 4.0000 mg | ORAL_TABLET | Freq: Four times a day (QID) | ORAL | Status: DC | PRN

## 2018-05-06 MED ORDER — INSULIN ASPART 100 UNIT/ML ~~LOC~~ SOLN
0.0000 [IU] | SUBCUTANEOUS | Status: DC
  Administered 2018-05-06: 2 [IU] via SUBCUTANEOUS
  Administered 2018-05-07: 1 [IU] via SUBCUTANEOUS
  Administered 2018-05-07: 2 [IU] via SUBCUTANEOUS

## 2018-05-06 MED ORDER — PANTOPRAZOLE SODIUM 40 MG IV SOLR: 40.0000 mg | Freq: Two times a day (BID) | INTRAVENOUS | Status: DC

## 2018-05-06 MED ORDER — SODIUM CHLORIDE 0.9 % IV SOLN
INTRAVENOUS | Status: AC
  Administered 2018-05-06: 23:00:00 via INTRAVENOUS

## 2018-05-06 MED ORDER — ONDANSETRON HCL 4 MG/2ML IJ SOLN: 4.0000 mg | Freq: Four times a day (QID) | INTRAMUSCULAR | Status: DC | PRN

## 2018-05-06 MED ORDER — BUPROPION HCL ER (SR) 150 MG PO TB12
150.0000 mg | ORAL_TABLET | Freq: Two times a day (BID) | ORAL | Status: DC
  Administered 2018-05-06 – 2018-05-07 (×2): 150 mg via ORAL
  Filled 2018-05-06: qty 1

## 2018-05-06 MED ORDER — ACETAMINOPHEN 325 MG PO TABS: 650.0000 mg | ORAL_TABLET | Freq: Four times a day (QID) | ORAL | Status: DC | PRN

## 2018-05-06 MED ORDER — ATORVASTATIN CALCIUM 40 MG PO TABS
80.0000 mg | ORAL_TABLET | Freq: Every evening | ORAL | Status: DC
  Administered 2018-05-06: 80 mg via ORAL
  Filled 2018-05-06: qty 2

## 2018-05-06 MED ORDER — LORAZEPAM 2 MG/ML IJ SOLN
1.0000 mg | Freq: Once | INTRAMUSCULAR | Status: AC
  Administered 2018-05-06: 1 mg via INTRAVENOUS
  Filled 2018-05-06: qty 1

## 2018-05-06 MED ORDER — GABAPENTIN 300 MG PO CAPS
300.0000 mg | ORAL_CAPSULE | Freq: Three times a day (TID) | ORAL | Status: DC
  Administered 2018-05-06 – 2018-05-07 (×2): 300 mg via ORAL
  Filled 2018-05-06: qty 1

## 2018-05-06 MED ORDER — SODIUM CHLORIDE 0.9 % IV SOLN
8.0000 mg/h | INTRAVENOUS | Status: DC
  Administered 2018-05-06: 8 mg/h via INTRAVENOUS
  Filled 2018-05-06 (×4): qty 80

## 2018-05-06 MED ORDER — GABAPENTIN 300 MG PO CAPS
ORAL_CAPSULE | ORAL | Status: AC
  Filled 2018-05-06: qty 1

## 2018-05-06 NOTE — ED Triage Notes (Signed)
Pt reports he called Dr. Cleatis Polka office. Pt is on xarelto and has started spitting up blood.  Pt reports he just had surgery.  Pt reports he just had his right leg checked by doppler for a clot.  Pt is concerned about an blood in emesis and possible ulcer

## 2018-05-06 NOTE — ED Notes (Signed)
Patient refused 2nd IV placement.
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
## 2018-05-06 NOTE — ED Notes (Signed)
ED TO INPATIENT HANDOFF REPORT  Name/Age/Gender Todd [name] 57 y.o. male  Code Status Code Status History    Date Active Date Inactive Code Status Order ID Comments User Context   05/01/2018 1635 05/03/2018 1425 Full Code [id number]  [name], MD Inpatient   09/20/2016 1141 09/21/2016 0315 Full Code [id number]  Logan Bores, MD HOV   01/11/2012 2112 01/12/2012 2025 Full Code [id number]  Roswell Nickel, RN Inpatient    Advance Directive Documentation     Most Recent Value  Type of Advance Directive  Healthcare Power of Attorney, Living will  Pre-existing out of facility DNR order (yellow form or pink MOST form)  -  "MOST" Form in Place?  -      Home/SNF/Other Home  Chief Complaint vomting blood from surgery  Level of Care/Admitting Diagnosis ED Disposition    ED Disposition Condition Hurt: [place] [[id number]]  Level of Care: Stepdown [14]  Admit to SDU based on following criteria: Hemodynamic compromise or significant risk of instability:  Patient requiring short term acute titration and management of vasoactive drips, and invasive monitoring (i.e., CVP and Arterial line).  Diagnosis: Upper GI bleed [941740]  Admitting Physician: Toy Baker [[id number]]  Attending Physician: Toy Baker [[id number]]  Estimated length of stay: 3 - 4 days  Certification:: I certify this patient will need inpatient services for at least 2 midnights  PT Class (Do Not Modify): Inpatient [101]  PT Acc Code (Do Not Modify): Private [1]       Medical History Past Medical History:  Diagnosis Date  . Arthritis   . CAD (coronary artery disease)    NSTEMI with DES to LAD; 100% nondominant RCA with collaterals - EF is normal.   . 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 Complication of anesthesia    slow to wake up   . Diabetes mellitus type II, non insulin dependent ([place])    DENIES ; STATES HIS PCP TOLD HIM " IM JUST A PRIME  CANDIDATE FOR IT"   . HLD (hyperlipidemia)   . Hydrocele in adult    confirmed by [place], elected to monitor after seeing urology.   . Hyperlipidemia   . Hypertension   . Skin abnormalities    affecting right hand thumb, pointer, and index fingers and palm; left hand thumb pointer and index finger. peeling/cracked/blister skin , warm to touch. scattered areas of superficial skin loss esp to R/L anterior index digits. glossy appearance and edema, reports itchiness, numbness, and pain, no drainage, occurs intermittently, ongoing for years, unsure of triggers, temp.relief w/steroids   . Tobacco abuse     Allergies Allergies  Allergen Reactions  . Dust Mite Extract Anaphylaxis  . Other Swelling, Rash and Other (See Comments)    grass  . Aspirin Nausea Only and Other (See Comments)    Patient can tolerate 81 mg ONLY  . Latex Other (See Comments)    Unknown  . Ibuprofen Nausea And Vomiting and Other (See Comments)    Causes Severe headaches    IV Location/Drains/Wounds Patient Lines/Drains/Airways Status   Active Line/Drains/Airways    Name:   Placement date:   Placement time:   Site:   Days:   Peripheral IV 05/06/18 Left Antecubital   05/06/18    1813    Antecubital   less than 1   Incision (Closed) 05/01/18 Knee Right   05/01/18    1401  5          Labs/Imaging Results for orders placed or performed during the hospital encounter of 05/06/18 (from the past 48 hour(s))  Comprehensive metabolic panel     Status: Abnormal   Collection Time: 05/06/18  6:08 PM  Result Value Ref Range   Sodium 133 (L) 135 - 145 mmol/L   Potassium 3.7 3.5 - 5.1 mmol/L   Chloride 97 (L) 98 - 111 mmol/L    Comment: Please note change in reference range.   CO2 26 22 - 32 mmol/L   Glucose, Bld 183 (H) 70 - 99 mg/dL    Comment: Please note change in reference range.   BUN 10 6 - 20 mg/dL    Comment: Please note change in reference range.   Creatinine, Ser 0.67 0.61 - 1.24 mg/dL   Calcium 8.5 (L) 8.9  - 10.3 mg/dL   Total Protein 6.4 (L) 6.5 - 8.1 g/dL   Albumin 3.1 (L) 3.5 - 5.0 g/dL   AST 26 15 - 41 U/L   ALT 35 0 - 44 U/L    Comment: Please note change in reference range.   Alkaline Phosphatase 50 38 - 126 U/L   Total Bilirubin 0.6 0.3 - 1.2 mg/dL   GFR calc non Af Amer >60 >60 mL/min   GFR calc Af Amer >60 >60 mL/min    Comment: (NOTE) The eGFR has been calculated using the CKD EPI equation. This calculation has not been validated in all clinical situations. eGFR's persistently <60 mL/min signify possible Chronic Kidney Disease.    Anion gap 10 5 - 15    Comment: Performed at [facility], [place] [address] [place], [place] [address]  Lipase, blood     Status: None   Collection Time: 05/06/18  6:08 PM  Result Value Ref Range   Lipase 22 11 - 51 U/L    Comment: Performed at [facility], [place] [address], [place], [place] [address]  CBC with Differential     Status: Abnormal   Collection Time: 05/06/18  6:08 PM  Result Value Ref Range   WBC 10.6 (H) 4.0 - 10.5 K/uL   RBC 3.29 (L) 4.22 - 5.81 MIL/uL   Hemoglobin 10.9 (L) 13.0 - 17.0 g/dL   HCT 31.0 (L) 39.0 - 52.0 %   MCV 94.2 78.0 - 100.0 fL   MCH 33.1 26.0 - 34.0 pg   MCHC 35.2 30.0 - 36.0 g/dL   RDW 12.6 11.5 - 15.5 %   Platelets 241 150 - 400 K/uL   Neutrophils Relative % 71 %   Neutro Abs 7.5 1.7 - 7.7 K/uL   Lymphocytes Relative 14 %   Lymphs Abs 1.5 0.7 - 4.0 K/uL   Monocytes Relative 13 %   Monocytes Absolute 1.4 (H) 0.1 - 1.0 K/uL   Eosinophils Relative 2 %   Eosinophils Absolute 0.2 0.0 - 0.7 K/uL   Basophils Relative 0 %   Basophils Absolute 0.0 0.0 - 0.1 K/uL    Comment: Performed at [facility], [place] [address] [place], [place] [address]  Ethanol     Status: None   Collection Time: 05/06/18  6:17 PM  Result Value Ref Range   Alcohol, Ethyl (B) <10 <10 mg/dL    Comment: (NOTE) Lowest detectable limit for serum alcohol is 10 mg/dL. For medical  purposes only. Performed at [facility], [place] [address], [place], [place] [address]    Dg Abdomen  1 View  Result Date: 05/06/2018 CLINICAL DATA:  Vomiting blood, history of ulcer EXAM: ABDOMEN - 1 VIEW COMPARISON:  None. FINDINGS: Lung bases demonstrate no acute opacity. No free air beneath the diaphragm. Nonobstructed gas pattern with moderate stool in the colon. IMPRESSION: Negative. Electronically Signed   By: Donavan Foil M.D.   On: 05/06/2018 18:52    Pending Labs Unresulted Labs (From admission, onward)   Start     Ordered   05/07/18 0500  Comprehensive metabolic panel  Once,   R    Comments:  Cal MD for K<3.5 or >5.0    05/06/18 2211   05/07/18 0500  CBC  Once,   R    Comments:  Call for hg <8.0    05/06/18 2211   Signed and Held  Hemoglobin A1c  Tomorrow morning,   R    Comments:  To assess prior glycemic control    Signed and Held   Signed and Held  Type and screen [place]  Once,   R    Comments:  [place]    Signed and Held   Signed and Held  CBC  Now then every 8 hours,   R    Comments:  Call md if Hg<8    Signed and Held   Signed and Held  HIV antibody (Routine Testing)  Tomorrow morning,   R     Signed and Held   Signed and Held  Magnesium  Tomorrow morning,   R    Comments:  Call MD if <1.5    Signed and Held   Signed and Held  Phosphorus  Tomorrow morning,   R     Signed and Held   Signed and Held  TSH  Once,   R    Comments:  Cancel if already done within 1 month and notify MD    Signed and Held      Vitals/Pain Today's Vitals   05/06/18 2000 05/06/18 2130 05/06/18 2200 05/06/18 2230  BP: 138/60 (!) 141/96 (!) 164/70 (!) 161/82  Pulse: 72 77 76 78  Resp:  15  20  Temp:      TempSrc:      SpO2: 100% 97% 98% 97%  Weight:      Height:      PainSc:        Isolation Precautions No active isolations  Medications Medications  pantoprazole (PROTONIX) 80 mg in sodium chloride 0.9  % 250 mL (0.32 mg/mL) infusion (8 mg/hr Intravenous New Bag/Given 05/06/18 2127)  pantoprazole (PROTONIX) injection 40 mg (has no administration in time range)  atorvastatin (LIPITOR) tablet 80 mg (has no administration in time range)  buPROPion (WELLBUTRIN SR) 12 hr tablet 150 mg (150 mg Oral Given 05/06/18 2238)  carvedilol (COREG) tablet 12.5 mg (has no administration in time range)  gabapentin (NEURONTIN) capsule 300 mg (300 mg Oral Given 05/06/18 2238)  HYDROcodone-acetaminophen (NORCO) 7.5-325 MG per tablet 1-2 tablet (2 tablets Oral Given 05/06/18 2217)  acetaminophen (TYLENOL) tablet 650 mg (has no administration in time range)    Or  acetaminophen (TYLENOL) suppository 650 mg (has no administration in time range)  ondansetron (ZOFRAN) tablet 4 mg (has no administration in time range)    Or  ondansetron (ZOFRAN) injection 4 mg (has no administration in time range)  famotidine (PEPCID) IVPB 20 mg premix (20 mg Intravenous New Bag/Given 05/06/18 1814)  LORazepam (ATIVAN) injection 1 mg (1 mg Intravenous Given 05/06/18 1821)  sodium chloride 0.9 % bolus 1,000 mL (0 mLs Intravenous Stopped 05/06/18 2042)  pantoprazole (PROTONIX) 80 mg in sodium chloride 0.9 % 100 mL IVPB (0 mg Intravenous Stopped 05/06/18 2114)    Mobility walks with device- walker

## 2018-05-06 NOTE — ED Notes (Signed)
Report given to alexis, rn

## 2018-05-06 NOTE — ED Notes (Signed)
Per DR Derrill Kay, patient was at therapy in office today when he started vomiting blood-patient has a history of an ulcer-states he was negative for DVT in right leg-he is able to bear weight on right leg with a walker only
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
## 2018-05-07 ENCOUNTER — Inpatient Hospital Stay (HOSPITAL_COMMUNITY): Payer: BLUE CROSS/BLUE SHIELD | Admitting: Anesthesiology

## 2018-05-07 ENCOUNTER — Encounter (HOSPITAL_COMMUNITY): Payer: Self-pay

## 2018-05-07 ENCOUNTER — Telehealth: Payer: Self-pay

## 2018-05-07 ENCOUNTER — Encounter (HOSPITAL_COMMUNITY)
Admission: EM | Disposition: A | Discharge status: Home / Self Care | Payer: Self-pay | Source: Home / Self Care | Attending: Internal Medicine

## 2018-05-07 DIAGNOSIS — D62 Acute posthemorrhagic anemia: Secondary | ICD-10-CM | POA: Diagnosis not present

## 2018-05-07 DIAGNOSIS — Z7901 Long term (current) use of anticoagulants: Secondary | ICD-10-CM

## 2018-05-07 DIAGNOSIS — K922 Gastrointestinal hemorrhage, unspecified: Secondary | ICD-10-CM | POA: Diagnosis not present

## 2018-05-07 DIAGNOSIS — E119 Type 2 diabetes mellitus without complications: Secondary | ICD-10-CM | POA: Diagnosis not present

## 2018-05-07 DIAGNOSIS — K5900 Constipation, unspecified: Secondary | ICD-10-CM | POA: Diagnosis not present

## 2018-05-07 DIAGNOSIS — K920 Hematemesis: Secondary | ICD-10-CM | POA: Diagnosis not present

## 2018-05-07 DIAGNOSIS — R112 Nausea with vomiting, unspecified: Secondary | ICD-10-CM

## 2018-05-07 DIAGNOSIS — Z720 Tobacco use: Secondary | ICD-10-CM

## 2018-05-07 DIAGNOSIS — E7800 Pure hypercholesterolemia, unspecified: Secondary | ICD-10-CM

## 2018-05-07 DIAGNOSIS — K2100 Gastro-esophageal reflux disease with esophagitis, without bleeding: Secondary | ICD-10-CM

## 2018-05-07 DIAGNOSIS — K2210 Ulcer of esophagus without bleeding: Secondary | ICD-10-CM

## 2018-05-07 DIAGNOSIS — I10 Essential (primary) hypertension: Secondary | ICD-10-CM

## 2018-05-07 DIAGNOSIS — I2510 Atherosclerotic heart disease of native coronary artery without angina pectoris: Secondary | ICD-10-CM

## 2018-05-07 HISTORY — PX: ESOPHAGOGASTRODUODENOSCOPY: SHX5428

## 2018-05-07 LAB — COMPREHENSIVE METABOLIC PANEL
ALBUMIN: 2.9 g/dL — AB (ref 3.5–5.0)
ALT: 34 U/L (ref 0–44)
AST: 24 U/L (ref 15–41)
Alkaline Phosphatase: 45 U/L (ref 38–126)
Anion gap: 6 (ref 5–15)
BILIRUBIN TOTAL: 1.1 mg/dL (ref 0.3–1.2)
BUN: 8 mg/dL (ref 6–20)
CO2: 27 mmol/L (ref 22–32)
Calcium: 8.4 mg/dL — ABNORMAL LOW (ref 8.9–10.3)
Chloride: 104 mmol/L (ref 98–111)
Creatinine, Ser: 0.68 mg/dL (ref 0.61–1.24)
Glucose, Bld: 138 mg/dL — ABNORMAL HIGH (ref 70–99)
POTASSIUM: 3.3 mmol/L — AB (ref 3.5–5.1)
SODIUM: 137 mmol/L (ref 135–145)
TOTAL PROTEIN: 5.9 g/dL — AB (ref 6.5–8.1)

## 2018-05-07 LAB — PHOSPHORUS: PHOSPHORUS: 3 mg/dL (ref 2.5–4.6)

## 2018-05-07 LAB — CBC
HCT: 29.4 % — ABNORMAL LOW (ref 39.0–52.0)
HEMATOCRIT: 29.9 % — AB (ref 39.0–52.0)
Hemoglobin: 10.2 g/dL — ABNORMAL LOW (ref 13.0–17.0)
Hemoglobin: 10.2 g/dL — ABNORMAL LOW (ref 13.0–17.0)
MCH: 32.7 pg (ref 26.0–34.0)
MCH: 33.3 pg (ref 26.0–34.0)
MCHC: 34.1 g/dL (ref 30.0–36.0)
MCHC: 34.7 g/dL (ref 30.0–36.0)
MCV: 95.8 fL (ref 78.0–100.0)
MCV: 96.1 fL (ref 78.0–100.0)
PLATELETS: 221 10*3/uL (ref 150–400)
Platelets: 219 10*3/uL (ref 150–400)
RBC: 3.12 MIL/uL — ABNORMAL LOW (ref 4.22–5.81)
RBC: 3.06 MIL/uL — AB (ref 4.22–5.81)
RDW: 12.8 % (ref 11.5–15.5)
RDW: 12.8 % (ref 11.5–15.5)
WBC: 7.9 10*3/uL (ref 4.0–10.5)
WBC: 7.6 10*3/uL (ref 4.0–10.5)

## 2018-05-07 LAB — GLUCOSE, CAPILLARY
GLUCOSE-CAPILLARY: 161 mg/dL — AB (ref 70–99)
Glucose-Capillary: 148 mg/dL — ABNORMAL HIGH (ref 70–99)

## 2018-05-07 LAB — MRSA PCR SCREENING: MRSA by PCR: NEGATIVE

## 2018-05-07 LAB — TYPE AND SCREEN
ABO/RH(D): A NEG
Antibody Screen: NEGATIVE

## 2018-05-07 LAB — HIV ANTIBODY (ROUTINE TESTING W REFLEX): HIV SCREEN 4TH GENERATION: NONREACTIVE

## 2018-05-07 LAB — MAGNESIUM: MAGNESIUM: 2.1 mg/dL (ref 1.7–2.4)

## 2018-05-07 LAB — TSH: TSH: 1.511 u[IU]/mL (ref 0.350–4.500)

## 2018-05-07 LAB — HEMOGLOBIN A1C
Hgb A1c MFr Bld: 6.8 % — ABNORMAL HIGH (ref 4.8–5.6)
Mean Plasma Glucose: 148.46 mg/dL

## 2018-05-07 SURGERY — EGD (ESOPHAGOGASTRODUODENOSCOPY)
Anesthesia: Monitor Anesthesia Care

## 2018-05-07 MED ORDER — PANTOPRAZOLE SODIUM 40 MG PO TBEC
40.0000 mg | DELAYED_RELEASE_TABLET | Freq: Two times a day (BID) | ORAL | Status: DC
  Administered 2018-05-07: 40 mg via ORAL

## 2018-05-07 MED ORDER — POTASSIUM CHLORIDE 10 MEQ/100ML IV SOLN
10.0000 meq | INTRAVENOUS | Status: AC
Start: 2018-05-07 — End: 2018-05-07
  Administered 2018-05-07 (×2): 10 meq via INTRAVENOUS
  Filled 2018-05-07 (×3): qty 100

## 2018-05-07 MED ORDER — PROPOFOL 500 MG/50ML IV EMUL
INTRAVENOUS | Status: DC | PRN
  Administered 2018-05-07: 350 ug/kg/min via INTRAVENOUS

## 2018-05-07 MED ORDER — LACTATED RINGERS IV SOLN
INTRAVENOUS | Status: DC | PRN
  Administered 2018-05-07: 12:00:00 via INTRAVENOUS

## 2018-05-07 MED ORDER — LACTATED RINGERS IV SOLN
INTRAVENOUS | Status: DC
  Administered 2018-05-07: 1000 mL via INTRAVENOUS

## 2018-05-07 MED ORDER — SODIUM CHLORIDE 0.9 % IV SOLN
25.0000 mg | Freq: Once | INTRAVENOUS | Status: AC
  Administered 2018-05-07: 25 mg via INTRAVENOUS
  Filled 2018-05-07: qty 1

## 2018-05-07 MED ORDER — PROPOFOL 10 MG/ML IV BOLUS
INTRAVENOUS | Status: AC
  Filled 2018-05-07: qty 40

## 2018-05-07 MED ORDER — LIDOCAINE HCL (CARDIAC) PF 100 MG/5ML IV SOSY
PREFILLED_SYRINGE | INTRAVENOUS | Status: DC | PRN
  Administered 2018-05-07: 75 mg via INTRAVENOUS

## 2018-05-07 MED ORDER — PANTOPRAZOLE SODIUM 40 MG PO TBEC: 40.0000 mg | DELAYED_RELEASE_TABLET | Freq: Two times a day (BID) | ORAL | 0 refills | Status: DC

## 2018-05-07 NOTE — Progress Notes (Signed)
Pt returned from endo, asking to leave and go home.
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
## 2018-05-07 NOTE — Telephone Encounter (Signed)
-----   Message from [name], MD sent at 05/07/2018  1:13 PM EDT ----- He needs rov with me in 4-6 weeks, hosp follow up.    Leaving today likely

## 2018-05-07 NOTE — Transfer of Care (Signed)
Immediate Anesthesia Transfer of Care Note  Patient: Todd [name]  Procedure(s) Performed: ESOPHAGOGASTRODUODENOSCOPY (EGD) (N/A )  Patient Location: PACU  Anesthesia Type:MAC  Level of Consciousness: awake, alert , oriented and patient cooperative  Airway & Oxygen Therapy: Patient Spontanous Breathing and Patient connected to nasal cannula oxygen  Post-op Assessment: Report given to RN, Post -op Vital signs reviewed and stable and Patient moving all extremities X 4  Post vital signs: stable  Last Vitals:  Vitals Value Taken Time  BP    Temp    Pulse    Resp    SpO2      Last Pain:  Vitals:   05/07/18 1142  TempSrc: Oral  PainSc: 5       Patients Stated Pain Goal: 0 (98/33/82 [id number])  Complications: No apparent anesthesia complications

## 2018-05-07 NOTE — Anesthesia Postprocedure Evaluation (Signed)
Anesthesia Post Note  Patient: Todd [name]  Procedure(s) Performed: ESOPHAGOGASTRODUODENOSCOPY (EGD) (N/A )     Patient location during evaluation: Endoscopy Anesthesia Type: MAC Level of consciousness: awake and alert Pain management: pain level controlled Vital Signs Assessment: post-procedure vital signs reviewed and stable Respiratory status: spontaneous breathing, nonlabored ventilation, respiratory function stable and patient connected to nasal cannula oxygen Cardiovascular status: blood pressure returned to baseline and stable Postop Assessment: no apparent nausea or vomiting Anesthetic complications: no    Last Vitals:  Vitals:   05/07/18 1254 05/07/18 1300  BP: (!) 114/55 127/70  Pulse: 72 72  Resp: 19 (!) 21  Temp: (!) 36.3 C   SpO2: 99% 99%    Last Pain:  Vitals:   05/07/18 1300  TempSrc:   PainSc: 0-No pain                 Keyri Salberg DANIEL

## 2018-05-07 NOTE — Discharge Instructions (Signed)
° °  Gastrointestinal Bleeding Gastrointestinal bleeding is bleeding somewhere along the path food travels through the body (digestive tract). This path is anywhere between the mouth and the opening of the butt (anus). You may have blood in your poop (stools) or have black poop. If you throw up (vomit), there may be blood in it. This condition can be mild, serious, or even life-threatening. If you have a lot of bleeding, you may need to stay in the hospital. Follow these instructions at home:  Take over-the-counter and prescription medicines only as told by your doctor.  Eat foods that have a lot of fiber in them. These foods include whole grains, fruits, and vegetables. You can also try eating 1-3 prunes each day.  Drink enough fluid to keep your pee (urine) clear or pale yellow.  Keep all follow-up visits as told by your doctor. This is important. Contact a doctor if:  Your symptoms do not get better. Get help right away if:  Your bleeding gets worse.  You feel dizzy or you pass out (faint).  You feel weak.  You have very bad cramps in your back or belly (abdomen).  You pass large clumps of blood (clots) in your poop.  Your symptoms are getting worse. This information is not intended to replace advice given to you by your health care provider. Make sure you discuss any questions you have with your health care provider. Document Released: 08/01/2008 Document Revised: 03/30/2016 Document Reviewed: 04/12/2015 Elsevier Interactive Patient Education  2018 [employer]. Follow up with PCP in 1-2 weeks Follow up with Dr. Ardis Hughs in 3 weeks. Resume home medications. Return to emergency room if you have any additional bleeding.  Take Protonix 40 mg twice a day.

## 2018-05-07 NOTE — Op Note (Signed)
[facility] Patient Name: Todd [name] Procedure Date: 05/07/2018 MRN: [id number] Attending MD: [name] , MD Date of Birth: [birth year] CSN: [id number] Age: 57 Admit Type: Inpatient Procedure:                Upper GI endoscopy Indications:              Hematemesis (limited), Nausea with vomiting Providers:                [name], MD, Cleda Daub, RN, Cletis Athens, Technician, Enrigue Catena, CRNA Referring MD:              Medicines:                Monitored Anesthesia Care Complications:            No immediate complications. Estimated blood loss:                            None. Estimated Blood Loss:     Estimated blood loss: none. Procedure:                Pre-Anesthesia Assessment:                           - Prior to the procedure, a History and Physical                            was performed, and patient medications and                            allergies were reviewed. The patient's tolerance of                            previous anesthesia was also reviewed. The risks                            and benefits of the procedure and the sedation                            options and risks were discussed with the patient.                            All questions were answered, and informed consent                            was obtained. Prior Anticoagulants: The patient has                            taken Xarelto (rivaroxaban), last dose was 2 days                            prior to procedure. ASA Grade Assessment: II - A  patient with mild systemic disease. After reviewing                            the risks and benefits, the patient was deemed in                            satisfactory condition to undergo the procedure.                           After obtaining informed consent, the endoscope was                            passed under direct vision. Throughout the    procedure, the patient's blood pressure, pulse, and                            oxygen saturations were monitored continuously. The                            [id number] ([id number]) scope was introduced through the                            mouth, and advanced to the second part of duodenum.                            The upper GI endoscopy was accomplished without                            difficulty. The patient tolerated the procedure                            well. Scope In: Scope Out: Findings:      Severe, acid related ulcerative esophagitis (LA Grade D). The mucosa was       friable with minor scope trauma.      The exam was otherwise without abnormality. Impression:               - Severe, acid related ulcerative esophagitis. This                            is almost certainly the result of your                            nausea/vomiting since the knee surgery.                           - The examination was otherwise normal. Moderate Sedation:      N/A- Per Anesthesia Care Recommendation:           - OK to d/c today from GI perspective.                           - He needs to be on prescription strength PPI twice  daily for at least the next 6 weeks and then once                            daily for as long as he needs to take aspirin.                           - He should also be on zofran 4mg  pills, one pill                            twice daily to help decrease nausea (caused by                            narcotics likely)                           - OK to resume his blood thinner today.                           - He should try to limit narcotic pain medicines as                            much as possible because I think the                            nausea/vomiting is very likely due to the pain meds.                           - My office will reach out to him for follow up                            appt in 3-4 weeks. Procedure Code(s):         --- Professional ---                           [id number], Esophagogastroduodenoscopy, flexible,                            transoral; diagnostic, including collection of                            specimen(s) by brushing or washing, when performed                            (separate procedure) Diagnosis Code(s):        --- Professional ---                           K21.0, Gastro-esophageal reflux disease with                            esophagitis                           K92.0, Hematemesis  R11.2, Nausea with vomiting, unspecified CPT copyright 2017 American Medical Association. All rights reserved. The codes documented in this report are preliminary and upon coder review may  be revised to meet current compliance requirements. [name], MD 05/07/2018 12:59:30 PM This report has been signed electronically. Number of Addenda: 0
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
## 2018-05-07 NOTE — Progress Notes (Signed)
Patient threatened to leave if not released-discharged soon. Dr. [name] paged again.

## 2018-05-07 NOTE — Progress Notes (Addendum)
PATIENT GIVEN DISCHARGE INSTRUCTIONS INCLUDING FOLLOW UP APPOINTMENTS  WITH PCP, DR JACOBS, AND MEDICATIONS INCLUDING PROTONIX; PT REPORTED UNDERSTANDING. BOTH IV'S WERE REMOVED; TOLERATED WELL. PATIENT TRANSPORTED [place] BELONGINGS INCLUDING CELL PHONE, WALLET, AND HIS [place].  AT THE FRONT ENTRANCE THE DAUGHTER HELPED RN ASSIST THE PATIENT INTO HIS DAUGHTERS SUV; NO DISTRESS NOTED. WHEN RN RETURNED TO THE UNIT SHE NOTED THE PATIENT TOOK THE SIGNED DISCHARGE INSTRUCTION DOCUMENTATION. CARE RELINQUISHED.

## 2018-05-07 NOTE — Anesthesia Preprocedure Evaluation (Addendum)
Anesthesia Evaluation  Patient identified by MRN, date of birth, ID band Patient awake    Reviewed: Allergy & Precautions, H&P , NPO status , Patient's Chart, lab work & pertinent test results, reviewed documented beta blocker date and time   History of Anesthesia Complications (+) PROLONGED EMERGENCE and history of anesthetic complications  Airway Mallampati: II  TM Distance: >3 FB Neck ROM: Full    Dental  (+) Dental Advisory Given, Poor Dentition, Edentulous Upper,    Pulmonary Current Smoker,    breath sounds clear to auscultation       Cardiovascular hypertension, + CAD, + Past MI and + Cardiac Stents   Rhythm:Regular Rate:Normal  NSTEMI with DES to LAD; 100% nondominant RCA with collaterals - EF is normal.    Neuro/Psych negative neurological ROS  negative psych ROS   GI/Hepatic Neg liver ROS,   Endo/Other  diabetes, Type 2  Renal/GU negative Renal ROS     Musculoskeletal  (+) Arthritis , Osteoarthritis,    Abdominal   Peds  Hematology negative hematology ROS (+)   Anesthesia Other Findings   Reproductive/Obstetrics                            Anesthesia Physical  Anesthesia Plan  ASA: III  Anesthesia Plan: MAC   Post-op Pain Management:    Induction: Intravenous  PONV Risk Score and Plan: 2 and Ondansetron and Propofol infusion  Airway Management Planned: Natural Airway  Additional Equipment: None  Intra-op Plan:   Post-operative Plan:   Informed Consent: I have reviewed the patients History and Physical, chart, labs and discussed the procedure including the risks, benefits and alternatives for the proposed anesthesia with the patient or authorized representative who has indicated his/her understanding and acceptance.   Dental advisory given  Plan Discussed with: CRNA, Anesthesiologist and Surgeon  Anesthesia Plan Comments: (  )        Anesthesia Quick  Evaluation

## 2018-05-07 NOTE — Progress Notes (Signed)
Patient demanding to leave. Dr [name] paged.

## 2018-05-07 NOTE — Progress Notes (Signed)
PROGRESS NOTE    Todd [name]  SVX:[id number] DOB: 18-Sep-1961 DOA: 05/06/2018 PCP: [name], MD   Brief Narrative:  57 year old with a history of CAD, peptic ulcer disease, diabetes mellitus type 2, hyperlipidemia, tobacco use who recently underwent right knee replacement was discharged on Xarelto for DVT prophylaxis started developing nausea vomiting for past 2 or 3 days along with hematemesis.  Patient stated over the course the last 2 or 3 days he is lost about 1 cup full of bright red/maroon blood.  He was evaluated by gastroenterology following morning and was taken for EGD.  He remained hemodynamically stable.   Assessment & Plan:   Active Problems:   Hypertension   Hyperlipidemia   Tobacco abuse   CAD (coronary artery disease)   Diabetes mellitus type II, non insulin dependent (HCC)   Upper GI bleed  Hematemesis/upper GI bleed -Concerning for Mallory-Weiss due to nausea/vomiting retching, gastritis versus ulcer.  Currently he is on Protonix drip which we will transition to IV/oral Protonix after his endoscopy and the findings -Continue to provide supportive care.  Depending on the findings will make determination for his anticoagulation prophylaxis versus conservative management -Antiemetics as needed.  After the procedure if he is cleared by gastroenterology he can be placed on diet -Eventually will need outpatient colonoscopy  Coronary artery disease with stents in place - Aspirin is currently on hold.  Home medications can be restarted once he is allowed oral diet  Hyperlipidemia -On atorvastatin 80 mg daily at home  Essential hypertension -Currently on antihypertensives on hold.  Will resume as appropriate.  Recent right knee replacement -Pain control, bowel regimen PRN for constipation.  Will have to determine his chemoprophylaxis at the time of discharge versus no prophylaxis given his bleeding.  We will have to make determination after his  procedure  Diabetes mellitus type 2 with peripheral neuropathy -Holding home antidiabetic medication.  Accu-Cheks and sliding scale   DVT prophylaxis: SCDs Code Status: Full code Family Communication: None at bedside Disposition Plan: Maintain inpatient stay.  If remains stable he can be transferred out of the [facility] latertoday.  Consultants:   GI  Procedures:   Endoscopy  Antimicrobials:   None   Subjective: No episode of bleeding since he has been the hospital.  No complaints.  Review of Systems Otherwise negative except as per HPI, including: General: Denies fever, chills, night sweats or unintended weight loss. Resp: Denies cough, wheezing, shortness of breath. Cardiac: Denies chest pain, palpitations, orthopnea, paroxysmal nocturnal dyspnea. GI: Denies abdominal pain, nausea, vomiting, diarrhea or constipation GU: Denies dysuria, frequency, hesitancy or incontinence MS: Denies muscle aches, joint pain or swelling Neuro: Denies headache, neurologic deficits (focal weakness, numbness, tingling), abnormal gait Psych: Denies anxiety, depression, SI/HI/AVH Skin: Denies new rashes or lesions ID: Denies sick contacts, exotic exposures, travel  Objective: Vitals:   05/07/18 0800 05/07/18 0900 05/07/18 1000 05/07/18 1142  BP: (!) 166/126 (!) 198/99 (!) 208/109 128/71  Pulse:    68  Resp: 17 16 15 16   Temp: 98.3 F (36.8 C)   97.8 F (36.6 C)  TempSrc: Oral   Oral  SpO2: 99% 95% 95% 99%  Weight:      Height:        Intake/Output Summary (Last 24 hours) at 05/07/2018 1145 Last data filed at 05/07/2018 1033 Gross per 24 hour  Intake 868.33 ml  Output 500 ml  Net 368.33 ml   Filed Weights   05/06/18 1710 05/06/18 2310  Weight: 97.1  kg (214 lb) 96.9 kg (213 lb 10 oz)    Examination:  General exam: Appears calm and comfortable ; dry mouth Respiratory system: Clear to auscultation. Respiratory effort normal. Cardiovascular system: S1 & S2 heard, RRR. No JVD,  murmurs, rubs, gallops or clicks. No pedal edema. Gastrointestinal system: Abdomen is nondistended, soft and nontender. No organomegaly or masses felt. Normal bowel sounds heard. Central nervous system: Alert and oriented. No focal neurological deficits. Extremities: Symmetric 5 x 5 power. Skin: No rashes, lesions or ulcers Psychiatry: Judgement and insight appear normal. Mood & affect appropriate.   Data Reviewed:   CBC: Recent Labs  Lab 05/03/18 0514 05/06/18 1808 05/06/18 2350 05/07/18 0513 05/07/18 0833  WBC 11.6* 10.6* 10.3 7.9 7.6  NEUTROABS  --  7.5  --   --   --   HGB 12.5* 10.9* 11.2* 10.2* 10.2*  HCT 36.6* 31.0* 31.6* 29.9* 29.4*  MCV 97.3 94.2 94.3 95.8 96.1  PLT 148* 241 242 219 956   Basic Metabolic Panel: Recent Labs  Lab 05/02/18 0510 05/03/18 0514 05/06/18 1808 05/07/18 0513  NA 133* 136 133* 137  K 4.5 3.4* 3.7 3.3*  CL 98 100 97* 104  CO2 26 29 26 27   GLUCOSE 218* 178* 183* 138*  BUN 11 10 10 8   CREATININE 0.88 0.86 0.67 0.68  CALCIUM 8.7* 8.4* 8.5* 8.4*  MG  --   --   --  2.1  PHOS  --   --   --  3.0   GFR: Estimated Creatinine Clearance: 110.3 mL/min (by C-G formula based on SCr of 0.68 mg/dL). Liver Function Tests: Recent Labs  Lab 05/06/18 1808 05/07/18 0513  AST 26 24  ALT 35 34  ALKPHOS 50 45  BILITOT 0.6 1.1  PROT 6.4* 5.9*  ALBUMIN 3.1* 2.9*   Recent Labs  Lab 05/06/18 1808  LIPASE 22   No results for input(s): AMMONIA in the last 168 hours. Coagulation Profile: No results for input(s): INR, PROTIME in the last 168 hours. Cardiac Enzymes: No results for input(s): CKTOTAL, CKMB, CKMBINDEX, TROPONINI in the last 168 hours. BNP (last 3 results) No results for input(s): PROBNP in the last 8760 hours. HbA1C: Recent Labs    05/07/18 0513  HGBA1C 6.8*   CBG: Recent Labs  Lab 05/02/18 2153 05/03/18 0729 05/06/18 2327 05/07/18 0402 05/07/18 0739  GLUCAP 186* 149* 167* 148* 161*   Lipid Profile: No results for  input(s): CHOL, HDL, LDLCALC, TRIG, CHOLHDL, LDLDIRECT in the last 72 hours. Thyroid Function Tests: Recent Labs    05/07/18 0513  TSH 1.511   Anemia Panel: No results for input(s): VITAMINB12, FOLATE, FERRITIN, TIBC, IRON, RETICCTPCT in the last 72 hours. Sepsis Labs: No results for input(s): PROCALCITON, LATICACIDVEN in the last 168 hours.  Recent Results (from the past 240 hour(s))  MRSA PCR Screening     Status: None   Collection Time: 05/06/18 11:13 PM  Result Value Ref Range Status   MRSA by PCR NEGATIVE NEGATIVE Final    Comment:        The GeneXpert MRSA Assay (FDA approved for NASAL specimens only), is one component of a comprehensive MRSA colonization surveillance program. It is not intended to diagnose MRSA infection nor to guide or monitor treatment for MRSA infections. Performed at [facility], [place] [address], [place], [place] [address]          Radiology Studies: Dg Abdomen 1 View  Result Date: 05/06/2018 CLINICAL DATA:  Vomiting blood, history  of ulcer EXAM: ABDOMEN - 1 VIEW COMPARISON:  None. FINDINGS: Lung bases demonstrate no acute opacity. No free air beneath the diaphragm. Nonobstructed gas pattern with moderate stool in the colon. IMPRESSION: Negative. Electronically Signed   By: Donavan Foil M.D.   On: 05/06/2018 18:52        Scheduled Meds: . [MAR Hold] atorvastatin  80 mg Oral QPM  . [MAR Hold] buPROPion  150 mg Oral BID  . [MAR Hold] carvedilol  12.5 mg Oral BID WC  . [MAR Hold] gabapentin  300 mg Oral TID  . [MAR Hold] insulin aspart  0-9 Units Subcutaneous Q4H  . [MAR Hold] pantoprazole  40 mg Intravenous Q12H   Continuous Infusions: . pantoprozole (PROTONIX) infusion 8 mg/hr (05/07/18 0510)  . potassium chloride 10 mEq (05/07/18 1055)     LOS: 1 day    I have spent 35 minutes face to face with the patient and on the ward discussing the patients care, assessment, plan and disposition with other care givers.  >50% of the time was devoted counseling the patient about the risks and benefits of treatment and coordinating care.     Timya Trimmer Arsenio Loader, MD [facility] Pager [phone]   If 7PM-7AM, please contact night-coverage [email or web address] Password [email or web address] 05/07/2018, 11:45 AM

## 2018-05-07 NOTE — Discharge Summary (Signed)
Physician Discharge Summary  Todd [name] OVZ:[id number] DOB: July 21, 1961 DOA: 05/06/2018  PCP: [name], MD  Admit date: 05/06/2018 Discharge date: 05/07/2018  Admitted From: Home  Disposition:  Home   Recommendations for Outpatient Follow-up:  1. Follow up with PCP in 1-2 weeks 2. Please obtain BMP/CBC in one week your next doctors visit.  3. Take PPI Twice daily before meals as prescribe 4. Follow up GI appointment to be made by their office.   Home Health: Home  Equipment/Devices: None Discharge Condition: Stable CODE STATUS:  Full  Diet recommendation:  Cardiac   Brief/Interim Summary: 57 year old with a history of CAD, peptic ulcer disease, diabetes mellitus type 2, hyperlipidemia, tobacco use who recently underwent right knee replacement was discharged on Xarelto for DVT prophylaxis started developing nausea vomiting for past 2 or 3 days along with hematemesis.  Patient stated over the course the last 2 or 3 days he is lost about 1 cup full of bright red/maroon blood.  He was evaluated by gastroenterology following morning and was taken for EGD.  He remained hemodynamically stable.  EGD showed severe ulcerative esophagitis therefore started on PPI BID AC, need follow up with GI. Cleared to resume Home dvt ppx and discharge.   Will discharge today in stable condition.    Discharge Diagnoses:  Active Problems:   Hypertension   Hyperlipidemia   Tobacco abuse   CAD (coronary artery disease)   Diabetes mellitus type II, non insulin dependent (HCC)   Hematemesis with nausea   Gastroesophageal reflux disease with esophagitis   Hematemesis/upper GI bleed due to erosive esophagitis. Grade D -s/p EGD today showing erosive esophagitis. GI rec- to resume Home dvt ppx, PPI BID AC and follow up outpatient. GI to arrange for outpatient follow up.   Coronary artery disease with stents in place - resume home meds   Hyperlipidemia -On atorvastatin 80 mg daily at  home  Essential hypertension -resume home meds.  Recent right knee replacement -resume home dvt ppx. Resume home meds   Diabetes mellitus type 2 with peripheral neuropathy -resume home diabetic meds    Discharge Instructions   Allergies as of 05/07/2018      Reactions   Dust Mite Extract Anaphylaxis   Other Swelling, Rash, Other (See Comments)   grass   Aspirin Nausea Only, Other (See Comments)   Patient can tolerate 81 mg ONLY   Latex Other (See Comments)   Unknown   Ibuprofen Nausea And Vomiting, Other (See Comments)   Causes Severe headaches      Medication List    TAKE these medications   alum & mag hydroxide-simeth 200-200-20 MG/5ML suspension Commonly known as:  MAALOX/MYLANTA Take 30 mLs by mouth every 6 (six) hours as needed for indigestion or heartburn.   aspirin EC 81 MG tablet Take 81 mg by mouth daily.   atorvastatin 80 MG tablet Commonly known as:  LIPITOR Take 80 mg by mouth every evening.   buPROPion 150 MG 12 hr tablet Commonly known as:  WELLBUTRIN SR Take 1 tablet (150 mg total) by mouth 2 (two) times daily.   calcium carbonate 500 MG chewable tablet Commonly known as:  TUMS - dosed in mg elemental calcium Chew 2 tablets by mouth daily as needed for indigestion or heartburn.   carvedilol 12.5 MG tablet Commonly known as:  COREG TAKE ONE TABLET BY MOUTH TWICE A DAY WITH MEALS   diphenhydrAMINE 25 MG tablet Commonly known as:  BENADRYL Take 1 tablet (25 mg total)  by mouth every 6 (six) hours as needed for itching or allergies (Rash).   docusate sodium 100 MG capsule Commonly known as:  COLACE Take 1 capsule (100 mg total) by mouth 2 (two) times daily.   EPINEPHrine 0.3 mg/0.3 mL Soaj injection Commonly known as:  EPIPEN 2-PAK Inject 0.3 mLs (0.3 mg total) into the muscle once as needed (for severe allergic reaction). CAll 911 immediately if you have to use this medicine   famotidine 20 MG tablet Commonly known as:  PEPCID TAKE 1  TABLET BY MOUTH DAILY AS NEEDED FOR HEARTBURN OR INDIGESTION.   gabapentin 300 MG capsule Commonly known as:  NEURONTIN Take 1 capsule (300 mg total) by mouth 3 (three) times daily.   HYDROcodone-acetaminophen 7.5-325 MG tablet Commonly known as:  NORCO Take 1-2 tablets by mouth every 6 (six) hours as needed for moderate pain.   losartan 50 MG tablet Commonly known as:  COZAAR TAKE ONE TABLET BY MOUTH ONE TIME DAILY   nitroGLYCERIN 0.4 MG SL tablet Commonly known as:  NITROSTAT Place 1 tablet (0.4 mg total) under the tongue every 5 (five) minutes as needed for chest pain.   pantoprazole 40 MG tablet Commonly known as:  PROTONIX Take 1 tablet (40 mg total) by mouth 2 (two) times daily before a meal.   polyethylene glycol packet Commonly known as:  MIRALAX / GLYCOLAX Take 17 g by mouth daily as needed for mild constipation.   potassium chloride SA 15 MEQ tablet Commonly known as:  KLOR-CON M15 Take 2 tablets (30 mEq total) by mouth 2 (two) times daily.   rivaroxaban 10 MG Tabs tablet Commonly known as:  XARELTO Take 1 tablet (10 mg total) by mouth daily with breakfast.   triamcinolone cream 0.1 % Commonly known as:  KENALOG Apply 1 application topically 2 (two) times daily. What changed:    when to take this  reasons to take this   VITAMIN E PO Take 1 tablet by mouth daily.      Follow-up Information    [name], MD. Schedule an appointment as soon as possible for a visit in 1 week(s).   Specialty:  Family Medicine Contact information: [address] [place] [address] [phone]          Allergies  Allergen Reactions  . Dust Mite Extract Anaphylaxis  . Other Swelling, Rash and Other (See Comments)    grass  . Aspirin Nausea Only and Other (See Comments)    Patient can tolerate 81 mg ONLY  . Latex Other (See Comments)    Unknown  . Ibuprofen Nausea And Vomiting and Other (See Comments)    Causes Severe headaches    You were  cared for by a hospitalist during your hospital stay. If you have any questions about your discharge medications or the care you received while you were in the hospital after you are discharged, you can call the unit and asked to speak with the hospitalist on call if the hospitalist that took care of you is not available. Once you are discharged, your primary care physician will handle any further medical issues. Please note that no refills for any discharge medications will be authorized once you are discharged, as it is imperative that you return to your primary care physician (or establish a relationship with a primary care physician if you do not have one) for your aftercare needs so that they can reassess your need for medications and monitor your lab values.  Consultations:  GI   Procedures/Studies: Dg Abdomen 1 View  Result Date: 05/06/2018 CLINICAL DATA:  Vomiting blood, history of ulcer EXAM: ABDOMEN - 1 VIEW COMPARISON:  None. FINDINGS: Lung bases demonstrate no acute opacity. No free air beneath the diaphragm. Nonobstructed gas pattern with moderate stool in the colon. IMPRESSION: Negative. Electronically Signed   By: Donavan Foil M.D.   On: 05/06/2018 18:52      Subjective: No complaints. Adamant about getting discharged home.   General = no fevers, chills, dizziness, malaise, fatigue HEENT/EYES = negative for pain, redness, loss of vision, double vision, blurred vision, loss of hearing, sore throat, hoarseness, dysphagia Cardiovascular= negative for chest pain, palpitation, murmurs, lower extremity swelling Respiratory/lungs= negative for shortness of breath, cough, hemoptysis, wheezing, mucus production Gastrointestinal= negative for nausea, vomiting,, abdominal pain, melena, hematemesis Genitourinary= negative for Dysuria, Hematuria, Change in Urinary Frequency MSK = Negative for arthralgia, myalgias, Back Pain, Joint swelling  Neurology= Negative for headache, seizures,  numbness, tingling  Psychiatry= Negative for anxiety, depression, suicidal and homocidal ideation Allergy/Immunology= Medication/Food allergy as listed  Skin= Negative for Rash, lesions, ulcers, itching   Discharge Exam: Vitals:   05/07/18 1326 05/07/18 1400  BP: 130/65 115/62  Pulse: 63 94  Resp: 17   Temp:    SpO2: 99%    Vitals:   05/07/18 1254 05/07/18 1300 05/07/18 1326 05/07/18 1400  BP: (!) 114/55 127/70 130/65 115/62  Pulse: 72 72 63 94  Resp: 19 (!) 21 17   Temp: (!) 97.4 F (36.3 C)     TempSrc: Oral     SpO2: 99% 99% 99%   Weight:      Height:        General: Pt is alert, awake, not in acute distress Cardiovascular: RRR, S1/S2 +, no rubs, no gallops Respiratory: CTA bilaterally, no wheezing, no rhonchi Abdominal: Soft, NT, ND, bowel sounds + Extremities: no edema, no cyanosis    The results of significant diagnostics from this hospitalization (including imaging, microbiology, ancillary and laboratory) are listed below for reference.     Microbiology: Recent Results (from the past 240 hour(s))  MRSA PCR Screening     Status: None   Collection Time: 05/06/18 11:13 PM  Result Value Ref Range Status   MRSA by PCR NEGATIVE NEGATIVE Final    Comment:        The GeneXpert MRSA Assay (FDA approved for NASAL specimens only), is one component of a comprehensive MRSA colonization surveillance program. It is not intended to diagnose MRSA infection nor to guide or monitor treatment for MRSA infections. Performed at [facility], [place] [address] [place], [place] [address]      Labs: BNP (last 3 results) No results for input(s): BNP in the last 8760 hours. Basic Metabolic Panel: Recent Labs  Lab 05/02/18 0510 05/03/18 0514 05/06/18 1808 05/07/18 0513  NA 133* 136 133* 137  K 4.5 3.4* 3.7 3.3*  CL 98 100 97* 104  CO2 26 29 26 27   GLUCOSE 218* 178* 183* 138*  BUN 11 10 10 8   CREATININE 0.88 0.86 0.67 0.68  CALCIUM 8.7* 8.4* 8.5*  8.4*  MG  --   --   --  2.1  PHOS  --   --   --  3.0   Liver Function Tests: Recent Labs  Lab 05/06/18 1808 05/07/18 0513  AST 26 24  ALT 35 34  ALKPHOS 50 45  BILITOT 0.6 1.1  PROT 6.4* 5.9*  ALBUMIN 3.1* 2.9*  Recent Labs  Lab 05/06/18 1808  LIPASE 22   No results for input(s): AMMONIA in the last 168 hours. CBC: Recent Labs  Lab 05/03/18 0514 05/06/18 1808 05/06/18 2350 05/07/18 0513 05/07/18 0833  WBC 11.6* 10.6* 10.3 7.9 7.6  NEUTROABS  --  7.5  --   --   --   HGB 12.5* 10.9* 11.2* 10.2* 10.2*  HCT 36.6* 31.0* 31.6* 29.9* 29.4*  MCV 97.3 94.2 94.3 95.8 96.1  PLT 148* 241 242 219 221   Cardiac Enzymes: No results for input(s): CKTOTAL, CKMB, CKMBINDEX, TROPONINI in the last 168 hours. BNP: Invalid input(s): POCBNP CBG: Recent Labs  Lab 05/02/18 2153 05/03/18 0729 05/06/18 2327 05/07/18 0402 05/07/18 0739  GLUCAP 186* 149* 167* 148* 161*   D-Dimer No results for input(s): DDIMER in the last 72 hours. Hgb A1c Recent Labs    05/07/18 0513  HGBA1C 6.8*   Lipid Profile No results for input(s): CHOL, HDL, LDLCALC, TRIG, CHOLHDL, LDLDIRECT in the last 72 hours. Thyroid function studies Recent Labs    05/07/18 0513  TSH 1.511   Anemia work up No results for input(s): VITAMINB12, FOLATE, FERRITIN, TIBC, IRON, RETICCTPCT in the last 72 hours. Urinalysis    Component Value Date/Time   COLORURINE YELLOW 11/16/2017 1025   APPEARANCEUR CLEAR 11/16/2017 1025   LABSPEC 1.003 11/16/2017 1025   PHURINE 6.5 11/16/2017 1025   GLUCOSEU NEGATIVE 11/16/2017 1025   HGBUR TRACE (A) 11/16/2017 1025   BILIRUBINUR NEGATIVE 01/11/2012 1553   KETONESUR NEGATIVE 11/16/2017 1025   PROTEINUR NEGATIVE 11/16/2017 1025   UROBILINOGEN 0.2 01/11/2012 1553   NITRITE NEGATIVE 11/16/2017 1025   LEUKOCYTESUR NEGATIVE 11/16/2017 1025   Sepsis Labs Invalid input(s): PROCALCITONIN,  WBC,  LACTICIDVEN Microbiology Recent Results (from the past 240 hour(s))  MRSA PCR  Screening     Status: None   Collection Time: 05/06/18 11:13 PM  Result Value Ref Range Status   MRSA by PCR NEGATIVE NEGATIVE Final    Comment:        The GeneXpert MRSA Assay (FDA approved for NASAL specimens only), is one component of a comprehensive MRSA colonization surveillance program. It is not intended to diagnose MRSA infection nor to guide or monitor treatment for MRSA infections. Performed at [facility], [place] [address], [place], [place] [address]      Time coordinating discharge:  I have spent 35 minutes face to face with the patient and on the ward discussing the patients care, assessment, plan and disposition with other care givers. >50% of the time was devoted counseling the patient about the risks and benefits of treatment/Discharge disposition and coordinating care.   SIGNED:   Damita Lack, MD  [facility] 05/07/2018, 3:41 PM Pager   If 7PM-7AM, please contact night-coverage [email or web address] Password [email or web address]

## 2018-05-07 NOTE — Interval H&P Note (Signed)
History and Physical Interval Note:  05/07/2018 12:32 PM  Todd [name]  has presented today for surgery, with the diagnosis of hematemesis  The various methods of treatment have been discussed with the patient and family. After consideration of risks, benefits and other options for treatment, the patient has consented to  Procedure(s): ESOPHAGOGASTRODUODENOSCOPY (EGD) (N/A) as a surgical intervention .  The patient's history has been reviewed, patient examined, no change in status, stable for surgery.  I have reviewed the patient's chart and labs.  Questions were answered to the patient's satisfaction.     [name]

## 2018-05-07 NOTE — Anesthesia Procedure Notes (Signed)
Procedure Name: MAC Date/Time: 05/07/2018 12:29 PM Performed by: Lissa Morales, CRNA Pre-anesthesia Checklist: Patient identified, Emergency Drugs available, Suction available, Timeout performed and Patient being monitored Patient Re-evaluated:Patient Re-evaluated prior to induction Oxygen Delivery Method: Nasal cannula Placement Confirmation: positive ETCO2 Dental Injury: Teeth and Oropharynx as per pre-operative assessment

## 2018-05-08 ENCOUNTER — Telehealth: Payer: Self-pay | Admitting: Gastroenterology

## 2018-05-08 NOTE — Telephone Encounter (Signed)
Follow up appt made for 06/05/18 at 830 am with Amy Esterwood.  Pt notified of the appt.

## 2018-05-08 NOTE — Telephone Encounter (Signed)
Pt's daughter [name] needs to speak with you about some medications that  Dr. Ardis Hughs put pt on. Pls call her.

## 2018-05-08 NOTE — Telephone Encounter (Signed)
Dr Ardis Hughs is it ok to double book, your next available is September?

## 2018-05-08 NOTE — Telephone Encounter (Signed)
I prefer not to double book this far out. Can you put him on wait list for appt around then, I'm absolutely sure something will open up before then.  Or put on extender schedule around then instead.    thanks

## 2018-05-08 NOTE — Telephone Encounter (Signed)
tried to return call to the pt's daughter and there was no answer and no voice mail was set up.

## 2018-05-10 ENCOUNTER — Encounter (HOSPITAL_COMMUNITY): Payer: Self-pay | Admitting: Gastroenterology

## 2018-05-10 NOTE — Telephone Encounter (Signed)
Tried again to reach the pt and his daughter, no response.  Will wait for further response from the pt or daughter.  Message was left for the pt to return call if they still have questions.

## 2018-05-13 ENCOUNTER — Ambulatory Visit: Payer: BLUE CROSS/BLUE SHIELD | Admitting: Family Medicine

## 2018-05-13 ENCOUNTER — Encounter: Payer: Self-pay | Admitting: Family Medicine

## 2018-05-13 ENCOUNTER — Other Ambulatory Visit: Payer: Self-pay | Admitting: Family Medicine

## 2018-05-13 VITALS — BP 112/62 | HR 80 | Temp 97.6°F | Resp 16 | Ht 67.0 in | Wt 213.0 lb

## 2018-05-13 DIAGNOSIS — I2510 Atherosclerotic heart disease of native coronary artery without angina pectoris: Secondary | ICD-10-CM

## 2018-05-13 DIAGNOSIS — K920 Hematemesis: Secondary | ICD-10-CM

## 2018-05-13 DIAGNOSIS — Z96651 Presence of right artificial knee joint: Secondary | ICD-10-CM

## 2018-05-13 DIAGNOSIS — Z09 Encounter for follow-up examination after completed treatment for conditions other than malignant neoplasm: Secondary | ICD-10-CM

## 2018-05-13 DIAGNOSIS — R7303 Prediabetes: Secondary | ICD-10-CM

## 2018-05-13 MED ORDER — GLIPIZIDE ER 2.5 MG PO TB24: 2.5000 mg | ORAL_TABLET | Freq: Every day | ORAL | 1 refills | Status: DC
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
## 2018-05-14 ENCOUNTER — Encounter (INDEPENDENT_AMBULATORY_CARE_PROVIDER_SITE_OTHER): Payer: Self-pay

## 2018-05-14 LAB — COMPLETE METABOLIC PANEL WITH GFR
AG Ratio: 1.6 (calc) (ref 1.0–2.5)
ALT: 27 U/L (ref 9–46)
AST: 17 U/L (ref 10–35)
Albumin: 3.9 g/dL (ref 3.6–5.1)
Alkaline phosphatase (APISO): 76 U/L (ref 40–115)
BUN: 8 mg/dL (ref 7–25)
CO2: 30 mmol/L (ref 20–32)
Calcium: 9 mg/dL (ref 8.6–10.3)
Chloride: 99 mmol/L (ref 98–110)
Creat: 0.9 mg/dL (ref 0.70–1.33)
GFR, Est African American: 108 mL/min/{1.73_m2} (ref >=60)
GFR, Est Non African American: 93 mL/min/{1.73_m2} (ref >=60)
Globulin: 2.5 g/dL (calc) (ref 1.9–3.7)
Glucose, Bld: 146 mg/dL — ABNORMAL HIGH (ref 65–99)
Potassium: 4.3 mmol/L (ref 3.5–5.3)
Sodium: 136 mmol/L (ref 135–146)
Total Bilirubin: 0.7 mg/dL (ref 0.2–1.2)
Total Protein: 6.4 g/dL (ref 6.1–8.1)

## 2018-05-14 LAB — CBC WITH DIFFERENTIAL/PLATELET
BASOS PCT: 0.4 %
Basophils Absolute: 57 cells/uL (ref 0–200)
Eosinophils Absolute: 227 cells/uL (ref 15–500)
Eosinophils Relative: 1.6 %
HEMATOCRIT: 35 % — AB (ref 38.5–50.0)
HEMOGLOBIN: 12 g/dL — AB (ref 13.2–17.1)
LYMPHS ABS: 2286 {cells}/uL (ref 850–3900)
MCH: 32.4 pg (ref 27.0–33.0)
MCHC: 34.3 g/dL (ref 32.0–36.0)
MCV: 94.6 fL (ref 80.0–100.0)
MPV: 9.8 fL (ref 7.5–12.5)
Monocytes Relative: 9.2 %
NEUTROS ABS: 10323 {cells}/uL — AB (ref 1500–7800)
Neutrophils Relative %: 72.7 %
Platelets: 447 10*3/uL — ABNORMAL HIGH (ref 140–400)
RBC: 3.7 10*6/uL — AB (ref 4.20–5.80)
RDW: 12.4 % (ref 11.0–15.0)
Total Lymphocyte: 16.1 %
WBC mixed population: 1306 cells/uL — ABNORMAL HIGH (ref 200–950)
WBC: 14.2 10*3/uL — AB (ref 3.8–10.8)

## 2018-05-23 ENCOUNTER — Telehealth: Payer: Self-pay | Admitting: Family Medicine

## 2018-05-23 ENCOUNTER — Other Ambulatory Visit: Payer: Self-pay | Admitting: Family Medicine

## 2018-05-23 MED ORDER — ATORVASTATIN CALCIUM 80 MG PO TABS: ORAL_TABLET | ORAL | 1 refills | Status: DC

## 2018-05-23 MED ORDER — CARVEDILOL 12.5 MG PO TABS: 12.5000 mg | ORAL_TABLET | Freq: Two times a day (BID) | ORAL | 3 refills | Status: DC

## 2018-05-23 MED ORDER — LOSARTAN POTASSIUM 50 MG PO TABS: ORAL_TABLET | ORAL | 3 refills | Status: DC

## 2018-05-23 MED ORDER — GLIPIZIDE 5 MG PO TABS: 5.0000 mg | ORAL_TABLET | Freq: Every day | ORAL | 3 refills | Status: DC

## 2018-05-23 NOTE — Telephone Encounter (Signed)
Increase glipizide to 5 mg a day (2 tabs poqam).

## 2018-05-23 NOTE — Telephone Encounter (Signed)
Pt's wife aware and med sent to pharm 

## 2018-05-23 NOTE — Telephone Encounter (Signed)
Pt's wife called and states these are his fasting blood sugars starting 05/14/18 - [phone]

## 2018-06-03 ENCOUNTER — Telehealth: Payer: Self-pay | Admitting: Family Medicine

## 2018-06-03 NOTE — Telephone Encounter (Signed)
Those sugars look acceptable. I would monitor his blood sugar every other day and if he feels symptoms.  Needs to keep an eye on it now that he is on medicine. If dropping below 100, we may be able to stop medicine.

## 2018-06-03 NOTE — Telephone Encounter (Signed)
Pt's FBS readings : 139,177,155,148,123,130,140,138,135,130,133  Pt is on Glipizide 5 mg and does he need to keep checking his sugars?

## 2018-06-04 NOTE — Telephone Encounter (Signed)
Pt's wife aware of recommendations 

## 2018-06-05 ENCOUNTER — Ambulatory Visit: Payer: Self-pay | Admitting: Physician Assistant

## 2018-09-09 ENCOUNTER — Other Ambulatory Visit: Payer: Self-pay | Admitting: Cardiology

## 2018-09-09 NOTE — Telephone Encounter (Signed)
Rx(s) sent to pharmacy electronically.  

## 2018-10-19 ENCOUNTER — Other Ambulatory Visit: Payer: Self-pay | Admitting: Family Medicine

## 2018-12-31 ENCOUNTER — Other Ambulatory Visit: Payer: Self-pay | Admitting: Family Medicine

## 2019-04-06 ENCOUNTER — Other Ambulatory Visit: Payer: Self-pay | Admitting: Family Medicine

## 2019-05-11 ENCOUNTER — Other Ambulatory Visit: Payer: Self-pay | Admitting: Family Medicine

## 2019-06-09 ENCOUNTER — Other Ambulatory Visit: Payer: Self-pay | Admitting: Family Medicine

## 2019-06-25 IMAGING — CR DG ABDOMEN 1V
1 series · 1 of 1 positions shown · non-contrast
Comparison: None.

CLINICAL DATA: Vomiting blood, history of ulcer

EXAM:
ABDOMEN - 1 VIEW

[w abdomen upright]
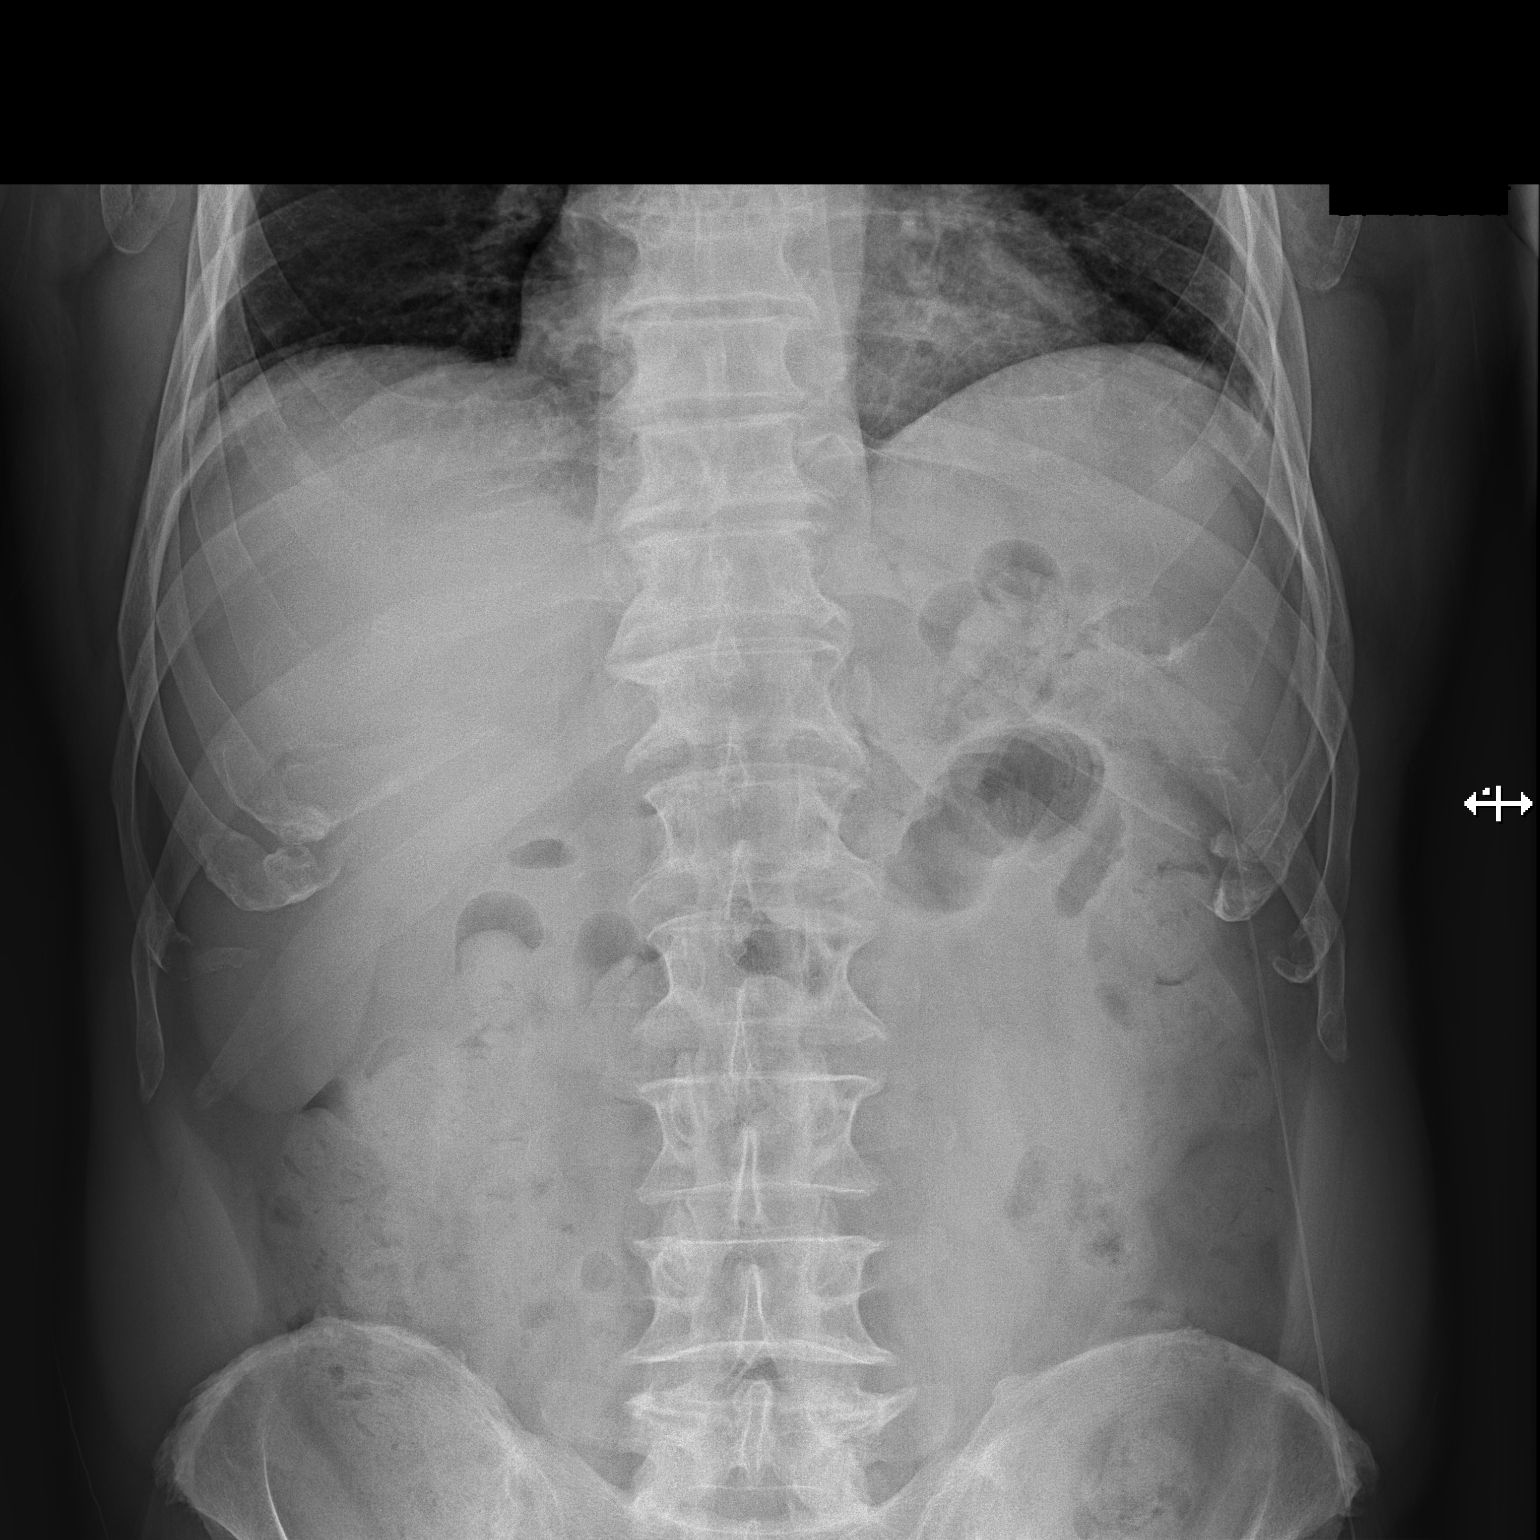

[1 of 1 positions shown; findings below may reference images not displayed]

FINDINGS: Lung bases demonstrate no acute opacity. No free air beneath the
diaphragm. Nonobstructed gas pattern with moderate stool in the
colon.
IMPRESSION: Negative.

## 2019-07-02 ENCOUNTER — Telehealth: Payer: Self-pay

## 2019-07-02 NOTE — Telephone Encounter (Signed)
Called patient left message on personal voice mail handicap parking form completed and left at [facility] office front desk.

## 2019-07-11 ENCOUNTER — Other Ambulatory Visit: Payer: Self-pay | Admitting: Family Medicine

## 2019-08-20 ENCOUNTER — Other Ambulatory Visit: Payer: Self-pay | Admitting: Family Medicine

## 2019-09-18 ENCOUNTER — Other Ambulatory Visit: Payer: Self-pay | Admitting: Family Medicine

## 2019-10-23 ENCOUNTER — Other Ambulatory Visit: Payer: Self-pay | Admitting: Family Medicine

## 2019-11-07 DIAGNOSIS — J189 Pneumonia, unspecified organism: Secondary | ICD-10-CM

## 2019-11-07 HISTORY — DX: Pneumonia, unspecified organism: J18.9

## 2019-12-01 ENCOUNTER — Other Ambulatory Visit: Payer: Self-pay

## 2019-12-01 ENCOUNTER — Encounter: Payer: Self-pay | Admitting: Family Medicine

## 2019-12-01 ENCOUNTER — Ambulatory Visit (INDEPENDENT_AMBULATORY_CARE_PROVIDER_SITE_OTHER): Payer: Medicare Other | Admitting: Family Medicine

## 2019-12-01 VITALS — BP 112/62 | HR 72 | Temp 97.2°F | Resp 16 | Ht 67.0 in | Wt 204.0 lb

## 2019-12-01 DIAGNOSIS — R7303 Prediabetes: Secondary | ICD-10-CM

## 2019-12-01 DIAGNOSIS — I10 Essential (primary) hypertension: Secondary | ICD-10-CM | POA: Diagnosis not present

## 2019-12-01 DIAGNOSIS — E119 Type 2 diabetes mellitus without complications: Secondary | ICD-10-CM | POA: Diagnosis not present

## 2019-12-01 DIAGNOSIS — E7800 Pure hypercholesterolemia, unspecified: Secondary | ICD-10-CM

## 2019-12-01 DIAGNOSIS — E785 Hyperlipidemia, unspecified: Secondary | ICD-10-CM | POA: Diagnosis not present

## 2019-12-01 DIAGNOSIS — I2510 Atherosclerotic heart disease of native coronary artery without angina pectoris: Secondary | ICD-10-CM

## 2019-12-01 DIAGNOSIS — Z13220 Encounter for screening for lipoid disorders: Secondary | ICD-10-CM | POA: Diagnosis not present

## 2019-12-01 DIAGNOSIS — Z125 Encounter for screening for malignant neoplasm of prostate: Secondary | ICD-10-CM

## 2019-12-01 MED ORDER — ALPRAZOLAM 0.5 MG PO TABS: 0.5000 mg | ORAL_TABLET | Freq: Three times a day (TID) | ORAL | 0 refills | Status: DC | PRN

## 2019-12-01 MED ORDER — GLIPIZIDE 5 MG PO TABS: ORAL_TABLET | ORAL | 1 refills | Status: DC

## 2019-12-01 MED ORDER — FAMOTIDINE 20 MG PO TABS: ORAL_TABLET | ORAL | 11 refills | Status: DC

## 2019-12-01 MED ORDER — LOSARTAN POTASSIUM 50 MG PO TABS: 50.0000 mg | ORAL_TABLET | Freq: Every day | ORAL | 3 refills | Status: DC

## 2019-12-01 MED ORDER — BUPROPION HCL ER (SR) 150 MG PO TB12: 150.0000 mg | ORAL_TABLET | Freq: Two times a day (BID) | ORAL | 2 refills | Status: DC

## 2019-12-01 MED ORDER — CARVEDILOL 12.5 MG PO TABS: ORAL_TABLET | ORAL | 3 refills | Status: DC

## 2019-12-01 MED ORDER — ATORVASTATIN CALCIUM 80 MG PO TABS: ORAL_TABLET | ORAL | 1 refills | Status: DC
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
## 2019-12-01 NOTE — Addendum Note (Signed)
Addended by: Shary Decamp B on: 12/01/2019 09:11 AM   Modules accepted: Orders

## 2019-12-01 NOTE — Progress Notes (Signed)
Patient ID: JULEAN WIEDRICH, male   DOB: 1961/09/19, 59 y.o.   MRN: [id number] Seen and agree with Ishmael Holter note above.  Check A1c.  If greater than 6.5 would recommend Jardiance given his history of cardiovascular disease.  Check fasting lipid panel.  If LDL cholesterol is greater than 70 would consider Praluent given his history of cardiovascular disease.  Check PSA to screen for prostate cancer.  Schedule the patient for Cologuard.  Patient states he is already had a flu shot and a diabetic eye exam this year.  Diabetic foot exam was performed today and is normal.  Will also check for diabetic nephropathy with a urine microalbumin.  Schedule the patient for Cologuard.  We will give the patient Xanax 0.5 mg every 8 hours as needed for anxiety.  Cautioned the patient to use this sparingly.  If he is using it once or twice a week for panic attacks I do not feel that this is a problem.  If he needs it more often than that, I would recommend changing his bupropion to try to better manage his anxiety.
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
## 2019-12-02 ENCOUNTER — Other Ambulatory Visit: Payer: Self-pay | Admitting: Family Medicine

## 2019-12-02 LAB — COMPLETE METABOLIC PANEL WITH GFR
AG Ratio: 1.8 (calc) (ref 1.0–2.5)
ALT: 20 U/L (ref 9–46)
AST: 16 U/L (ref 10–35)
Albumin: 4 g/dL (ref 3.6–5.1)
Alkaline phosphatase (APISO): 73 U/L (ref 35–144)
BUN: 7 mg/dL (ref 7–25)
CO2: 28 mmol/L (ref 20–32)
Calcium: 9 mg/dL (ref 8.6–10.3)
Chloride: 103 mmol/L (ref 98–110)
Creat: 0.71 mg/dL (ref 0.70–1.25)
GFR, Est African American: 118 mL/min/{1.73_m2} (ref >=60)
GFR, Est Non African American: 102 mL/min/{1.73_m2} (ref >=60)
Globulin: 2.2 g/dL (calc) (ref 1.9–3.7)
Glucose, Bld: 165 mg/dL — ABNORMAL HIGH (ref 65–99)
Potassium: 4.1 mmol/L (ref 3.5–5.3)
Sodium: 140 mmol/L (ref 135–146)
Total Bilirubin: 0.7 mg/dL (ref 0.2–1.2)
Total Protein: 6.2 g/dL (ref 6.1–8.1)

## 2019-12-02 LAB — CBC WITH DIFFERENTIAL/PLATELET
Absolute Monocytes: 585 cells/uL (ref 200–950)
Basophils Absolute: 61 cells/uL (ref 0–200)
Basophils Relative: 0.9 %
Eosinophils Absolute: 231 cells/uL (ref 15–500)
Eosinophils Relative: 3.4 %
HCT: 48.7 % (ref 38.5–50.0)
Hemoglobin: 16.9 g/dL (ref 13.2–17.1)
Lymphs Abs: 1911 cells/uL (ref 850–3900)
MCH: 32.6 pg (ref 27.0–33.0)
MCHC: 34.7 g/dL (ref 32.0–36.0)
MCV: 94 fL (ref 80.0–100.0)
MPV: 10.5 fL (ref 7.5–12.5)
Monocytes Relative: 8.6 %
Neutro Abs: 4012 cells/uL (ref 1500–7800)
Neutrophils Relative %: 59 %
Platelets: 163 10*3/uL (ref 140–400)
RBC: 5.18 10*6/uL (ref 4.20–5.80)
RDW: 12.6 % (ref 11.0–15.0)
Total Lymphocyte: 28.1 %
WBC: 6.8 10*3/uL (ref 3.8–10.8)

## 2019-12-02 LAB — HEMOGLOBIN A1C
Hgb A1c MFr Bld: 7.8 % of total Hgb — ABNORMAL HIGH (ref <5.7)
Mean Plasma Glucose: 177 (calc)
eAG (mmol/L): 9.8 (calc)

## 2019-12-02 LAB — LIPID PANEL
Cholesterol: 124 mg/dL (ref <200)
HDL: 31 mg/dL — ABNORMAL LOW (ref >=40)
LDL Cholesterol (Calc): 72 mg/dL (calc)
Non-HDL Cholesterol (Calc): 93 mg/dL (calc) (ref <130)
Total CHOL/HDL Ratio: 4 (calc) (ref <5.0)
Triglycerides: 127 mg/dL (ref <150)

## 2019-12-02 LAB — MICROALBUMIN, URINE: Microalb, Ur: 1.2 mg/dL

## 2019-12-02 LAB — PSA: PSA: 0.2 ng/mL (ref <=4.0)

## 2019-12-02 MED ORDER — METFORMIN HCL 500 MG PO TABS: ORAL_TABLET | ORAL | 3 refills | Status: DC
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
## 2020-01-08 ENCOUNTER — Telehealth: Payer: Medicare Other | Admitting: Cardiology

## 2020-01-08 ENCOUNTER — Ambulatory Visit: Payer: BLUE CROSS/BLUE SHIELD | Admitting: Cardiology

## 2020-01-08 ENCOUNTER — Telehealth: Payer: Self-pay

## 2020-01-08 NOTE — Telephone Encounter (Signed)
Called patient left message on personal voice mail. I tried calling you this morning for your virtual visit with Dr.Jordan, but your phone went straight to your voice mail.Advised to call me back to reschedule your appointment with Dr.Jordan.
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
## 2020-01-15 ENCOUNTER — Encounter: Payer: Self-pay | Admitting: Cardiology

## 2020-01-15 ENCOUNTER — Telehealth (INDEPENDENT_AMBULATORY_CARE_PROVIDER_SITE_OTHER): Payer: Medicare Other | Admitting: Cardiology

## 2020-01-15 VITALS — BP 172/101 | HR 69 | Ht 67.5 in | Wt 201.0 lb

## 2020-01-15 DIAGNOSIS — E7800 Pure hypercholesterolemia, unspecified: Secondary | ICD-10-CM

## 2020-01-15 DIAGNOSIS — I10 Essential (primary) hypertension: Secondary | ICD-10-CM | POA: Diagnosis not present

## 2020-01-15 DIAGNOSIS — Z720 Tobacco use: Secondary | ICD-10-CM

## 2020-01-15 DIAGNOSIS — I2510 Atherosclerotic heart disease of native coronary artery without angina pectoris: Secondary | ICD-10-CM

## 2020-01-15 MED ORDER — NITROGLYCERIN 0.4 MG SL SUBL: 0.4000 mg | SUBLINGUAL_TABLET | SUBLINGUAL | 6 refills | Status: DC | PRN

## 2020-01-15 NOTE — Patient Instructions (Signed)
Medication Instructions:  Continue same medications *If you need a refill on your cardiac medications before your next appointment, please call your pharmacy*   Lab Work: None ordered   Testing/Procedures: None ordered   Follow-Up: At [facility], you and your health needs are our priority.  As part of our continuing mission to provide you with exceptional heart care, we have created designated Provider Care Teams.  These Care Teams include your primary Cardiologist (physician) and Advanced Practice Providers (APPs -  Physician Assistants and Nurse Practitioners) who all work together to provide you with the care you need, when you need it.  We recommend signing up for the patient portal called "MyChart".  Sign up information is provided on this After Visit Summary.  MyChart is used to connect with patients for Virtual Visits (Telemedicine).  Patients are able to view lab/test results, encounter notes, upcoming appointments, etc.  Non-urgent messages can be sent to your provider as well.   To learn more about what you can do with MyChart, go to [email or web address].      Your next appointment:  1 year    Call in Nov to schedule March appointment    The format for your next appointment: Office   Provider:  Dr.Jordan

## 2020-01-29 ENCOUNTER — Ambulatory Visit: Payer: Medicare Other | Attending: Internal Medicine

## 2020-01-29 DIAGNOSIS — Z23 Encounter for immunization: Secondary | ICD-10-CM

## 2020-01-29 NOTE — Progress Notes (Signed)
   Covid-19 Vaccination Clinic  Name:  Todd [name]    MRN: [id number] DOB: 05/17/1961  01/29/2020  [name] was observed post Covid-19 immunization for 15 minutes without incident. He was provided with Vaccine Information Sheet and instruction to access the V-Safe system.   [name] was instructed to call 911 with any severe reactions post vaccine: Marland Kitchen Difficulty breathing  . Swelling of face and throat  . A fast heartbeat  . A bad rash all over body  . Dizziness and weakness   Immunizations Administered    Name Date Dose VIS Date Route   Pfizer COVID-19 Vaccine 01/29/2020  3:24 PM 0.3 mL 10/17/2019 Intramuscular   Manufacturer: [place]   Lot: [id number]   [place]: [id number]

## 2020-02-25 ENCOUNTER — Ambulatory Visit: Payer: Medicare Other | Attending: Internal Medicine

## 2020-02-25 ENCOUNTER — Other Ambulatory Visit: Payer: Self-pay | Admitting: Family Medicine

## 2020-02-25 DIAGNOSIS — Z23 Encounter for immunization: Secondary | ICD-10-CM

## 2020-02-25 NOTE — Progress Notes (Signed)
   Covid-19 Vaccination Clinic  Name:  Todd [name]    MRN: [id number] DOB: 09-Mar-1961  02/25/2020  [name] was observed post Covid-19 immunization for 15 minutes without incident. He was provided with Vaccine Information Sheet and instruction to access the V-Safe system.   [name] was instructed to call 911 with any severe reactions post vaccine: Marland Kitchen Difficulty breathing  . Swelling of face and throat  . A fast heartbeat  . A bad rash all over body  . Dizziness and weakness   Immunizations Administered    Name Date Dose VIS Date Route   Pfizer COVID-19 Vaccine 02/25/2020  9:16 AM 0.3 mL 12/31/2018 Intramuscular   Manufacturer: [place]   Lot: [id number]   [place]: [id number]

## 2020-03-13 ENCOUNTER — Other Ambulatory Visit: Payer: Self-pay | Admitting: Family Medicine

## 2020-03-15 NOTE — Telephone Encounter (Signed)
Ok to refill??  Last office visit/ refill 12/01/2019.

## 2020-05-03 ENCOUNTER — Ambulatory Visit (INDEPENDENT_AMBULATORY_CARE_PROVIDER_SITE_OTHER): Payer: Medicare Other | Admitting: Nurse Practitioner

## 2020-05-03 ENCOUNTER — Other Ambulatory Visit: Payer: Self-pay

## 2020-05-03 VITALS — BP 118/76 | HR 71 | Temp 97.6°F | Resp 18 | Wt 208.0 lb

## 2020-05-03 DIAGNOSIS — E119 Type 2 diabetes mellitus without complications: Secondary | ICD-10-CM | POA: Diagnosis not present

## 2020-05-03 DIAGNOSIS — G4700 Insomnia, unspecified: Secondary | ICD-10-CM | POA: Diagnosis not present

## 2020-05-03 DIAGNOSIS — I10 Essential (primary) hypertension: Secondary | ICD-10-CM | POA: Diagnosis not present

## 2020-05-03 DIAGNOSIS — K219 Gastro-esophageal reflux disease without esophagitis: Secondary | ICD-10-CM | POA: Diagnosis not present

## 2020-05-03 MED ORDER — LOSARTAN POTASSIUM-HCTZ 50-12.5 MG PO TABS: 1.0000 | ORAL_TABLET | Freq: Every day | ORAL | 3 refills | Status: DC

## 2020-05-03 MED ORDER — PANTOPRAZOLE SODIUM 40 MG PO TBEC: 40.0000 mg | DELAYED_RELEASE_TABLET | Freq: Every day | ORAL | 3 refills | Status: DC
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
## 2020-05-04 LAB — HEMOGLOBIN A1C
Hgb A1c MFr Bld: 7.1 % of total Hgb — ABNORMAL HIGH (ref <5.7)
Mean Plasma Glucose: 157 (calc)
eAG (mmol/L): 8.7 (calc)

## 2020-05-04 NOTE — Patient Instructions (Addendum)
Since non compliance with Diabetic medications over the past 5 months, pt having episodes of breaking out in cold sweats thinking he is having  dropping blood glucose but not checking glucose,  and having diarrhea with Jan 2021 increased metformin dose pt would like to discontinue or stay at the lower dose he is actually taking plan to redraw A1C today and readdress DM medication treatment plan based on result obtained. Will call once resulted continue taking medication as you are until then, hopefully results will be available tomorrow.   For the elevated BP: Pt would like to continue taking two pills not three so will stop Losartan monotherapy and prescribed Losartan/Hctz and continue Cozaar as currently prescribed, pt understands direction to take blood pressure 3 hours after taking the AM dose of these medications and over 2 weeks goals bp 140/90 or less and to seek medical attention for out of this range.  -follow 2 G in 24 hour or less sodium diet, low fat/cholesterol, carb, sugar diet.  -20 minutes exercise at least 4 times per week.   For insomnia-pt taking occasionally 3 mg Melatonin with some relief. Increase melatonin nightly to 10 mg.   Your sxs of coughing at night improves on left side with daytime acid reflux is consistent with gerd, For Gerd- daily AM ppi and bedtime Pepcid, may use prn pepcid as needed for breakthrough acid reflux. Follow up in 2 weeks.

## 2020-05-05 ENCOUNTER — Other Ambulatory Visit: Payer: Self-pay

## 2020-05-05 MED ORDER — BLOOD GLUCOSE METER KIT: 1.0000 | PACK | 0 refills | Status: DC

## 2020-06-03 ENCOUNTER — Other Ambulatory Visit: Payer: Self-pay | Admitting: Family Medicine

## 2020-06-09 ENCOUNTER — Other Ambulatory Visit: Payer: Self-pay | Admitting: Family Medicine

## 2020-06-09 NOTE — Telephone Encounter (Signed)
Ok to refill??  Last office visit 05/03/2020.  Last refill 03/15/2020.

## 2020-06-27 ENCOUNTER — Emergency Department (HOSPITAL_COMMUNITY)
Admission: EM | Admit: 2020-06-27 | Discharge: 2020-06-27 | Disposition: A | Discharge status: Home / Self Care | Payer: Medicare Other | Attending: Emergency Medicine | Admitting: Emergency Medicine

## 2020-06-27 ENCOUNTER — Other Ambulatory Visit: Payer: Self-pay

## 2020-06-27 ENCOUNTER — Emergency Department (HOSPITAL_COMMUNITY): Payer: Medicare Other

## 2020-06-27 ENCOUNTER — Encounter (HOSPITAL_COMMUNITY): Payer: Self-pay | Admitting: *Deleted

## 2020-06-27 DIAGNOSIS — R05 Cough: Secondary | ICD-10-CM | POA: Insufficient documentation

## 2020-06-27 DIAGNOSIS — R0902 Hypoxemia: Secondary | ICD-10-CM | POA: Diagnosis not present

## 2020-06-27 DIAGNOSIS — Z20822 Contact with and (suspected) exposure to covid-19: Secondary | ICD-10-CM | POA: Diagnosis not present

## 2020-06-27 DIAGNOSIS — Z5321 Procedure and treatment not carried out due to patient leaving prior to being seen by health care provider: Secondary | ICD-10-CM | POA: Diagnosis not present

## 2020-06-27 DIAGNOSIS — R0602 Shortness of breath: Secondary | ICD-10-CM | POA: Insufficient documentation

## 2020-06-27 DIAGNOSIS — R0789 Other chest pain: Secondary | ICD-10-CM | POA: Insufficient documentation

## 2020-06-27 DIAGNOSIS — R079 Chest pain, unspecified: Secondary | ICD-10-CM | POA: Diagnosis not present

## 2020-06-27 DIAGNOSIS — R6889 Other general symptoms and signs: Secondary | ICD-10-CM | POA: Diagnosis not present

## 2020-06-27 DIAGNOSIS — J90 Pleural effusion, not elsewhere classified: Secondary | ICD-10-CM | POA: Diagnosis not present

## 2020-06-27 DIAGNOSIS — Z743 Need for continuous supervision: Secondary | ICD-10-CM | POA: Diagnosis not present

## 2020-06-27 LAB — BASIC METABOLIC PANEL
Anion gap: 11 (ref 5–15)
BUN: 6 mg/dL — ABNORMAL LOW (ref 8–23)
CO2: 26 mmol/L (ref 22–32)
Calcium: 8.5 mg/dL — ABNORMAL LOW (ref 8.9–10.3)
Chloride: 93 mmol/L — ABNORMAL LOW (ref 98–111)
Creatinine, Ser: 0.63 mg/dL (ref 0.61–1.24)
GFR calc Af Amer: >60 mL/min (ref >60)
GFR calc non Af Amer: >60 mL/min (ref >60)
Glucose, Bld: 143 mg/dL — ABNORMAL HIGH (ref 70–99)
Potassium: 3.7 mmol/L (ref 3.5–5.1)
Sodium: 130 mmol/L — ABNORMAL LOW (ref 135–145)

## 2020-06-27 LAB — CBC
HCT: 44 % (ref 39.0–52.0)
Hemoglobin: 15 g/dL (ref 13.0–17.0)
MCH: 31.7 pg (ref 26.0–34.0)
MCHC: 34.1 g/dL (ref 30.0–36.0)
MCV: 93 fL (ref 80.0–100.0)
Platelets: 169 10*3/uL (ref 150–400)
RBC: 4.73 MIL/uL (ref 4.22–5.81)
RDW: 13 % (ref 11.5–15.5)
WBC: 8.6 10*3/uL (ref 4.0–10.5)
nRBC: 0 % (ref 0.0–0.2)

## 2020-06-27 LAB — TROPONIN I (HIGH SENSITIVITY): Troponin I (High Sensitivity): 8 ng/L (ref <18)

## 2020-06-27 LAB — SARS CORONAVIRUS 2 BY RT PCR (HOSPITAL ORDER, PERFORMED IN ~~LOC~~ HOSPITAL LAB): SARS Coronavirus 2: NEGATIVE

## 2020-06-27 NOTE — ED Triage Notes (Signed)
Pt arrived by [facility] for mid chest pain x 4 hours. Pt also has cough and sob, chest pain increases with cough. 1 nitro pta with no relief.

## 2020-06-27 NOTE — ED Notes (Signed)
Patient states that he deserves to be treated better than being put into the lobby to wait. Explained that the back was extremely full and that we had patients in the hallway. Patient took his blood pressure cuff off and walked out the front doors

## 2020-06-28 ENCOUNTER — Other Ambulatory Visit: Payer: Self-pay

## 2020-06-28 ENCOUNTER — Emergency Department (HOSPITAL_COMMUNITY): Payer: Medicare Other

## 2020-06-28 ENCOUNTER — Emergency Department (HOSPITAL_COMMUNITY)
Admission: EM | Admit: 2020-06-28 | Discharge: 2020-06-29 | Disposition: A | Discharge status: Home / Self Care | Payer: Medicare Other | Attending: Emergency Medicine | Admitting: Emergency Medicine

## 2020-06-28 ENCOUNTER — Encounter (HOSPITAL_COMMUNITY): Payer: Self-pay | Admitting: Emergency Medicine

## 2020-06-28 DIAGNOSIS — Z5321 Procedure and treatment not carried out due to patient leaving prior to being seen by health care provider: Secondary | ICD-10-CM | POA: Insufficient documentation

## 2020-06-28 DIAGNOSIS — F410 Panic disorder [episodic paroxysmal anxiety] without agoraphobia: Secondary | ICD-10-CM | POA: Insufficient documentation

## 2020-06-28 DIAGNOSIS — I509 Heart failure, unspecified: Secondary | ICD-10-CM | POA: Diagnosis not present

## 2020-06-28 DIAGNOSIS — J9811 Atelectasis: Secondary | ICD-10-CM | POA: Diagnosis not present

## 2020-06-28 DIAGNOSIS — R0602 Shortness of breath: Secondary | ICD-10-CM | POA: Diagnosis not present

## 2020-06-28 DIAGNOSIS — J189 Pneumonia, unspecified organism: Secondary | ICD-10-CM | POA: Diagnosis not present

## 2020-06-28 LAB — CBC
HCT: 42.8 % (ref 39.0–52.0)
Hemoglobin: 14.7 g/dL (ref 13.0–17.0)
MCH: 31 pg (ref 26.0–34.0)
MCHC: 34.3 g/dL (ref 30.0–36.0)
MCV: 90.3 fL (ref 80.0–100.0)
Platelets: 188 10*3/uL (ref 150–400)
RBC: 4.74 MIL/uL (ref 4.22–5.81)
RDW: 12.8 % (ref 11.5–15.5)
WBC: 8.7 10*3/uL (ref 4.0–10.5)
nRBC: 0 % (ref 0.0–0.2)

## 2020-06-28 LAB — BASIC METABOLIC PANEL
Anion gap: 12 (ref 5–15)
BUN: 7 mg/dL — ABNORMAL LOW (ref 8–23)
CO2: 26 mmol/L (ref 22–32)
Calcium: 8.9 mg/dL (ref 8.9–10.3)
Chloride: 91 mmol/L — ABNORMAL LOW (ref 98–111)
Creatinine, Ser: 0.57 mg/dL — ABNORMAL LOW (ref 0.61–1.24)
GFR calc Af Amer: >60 mL/min (ref >60)
GFR calc non Af Amer: >60 mL/min (ref >60)
Glucose, Bld: 86 mg/dL (ref 70–99)
Potassium: 3.7 mmol/L (ref 3.5–5.1)
Sodium: 129 mmol/L — ABNORMAL LOW (ref 135–145)

## 2020-06-28 LAB — TROPONIN I (HIGH SENSITIVITY): Troponin I (High Sensitivity): 8 ng/L (ref <18)

## 2020-06-28 LAB — BRAIN NATRIURETIC PEPTIDE: B Natriuretic Peptide: 29.8 pg/mL (ref 0.0–100.0)

## 2020-06-28 NOTE — ED Triage Notes (Addendum)
Pt presents to ED POV. Pt c/o panic attacks. Pt states that he breaks out in cold sweats and feelings like everything clamps up. Pt reports orthopnea. And inability to sleep. Pt is diaphoretic in triage otherwise NAD. Hx MI and CHF

## 2020-06-29 ENCOUNTER — Encounter: Payer: Self-pay | Admitting: Family Medicine

## 2020-06-29 ENCOUNTER — Telehealth: Payer: Self-pay | Admitting: Family Medicine

## 2020-06-29 ENCOUNTER — Ambulatory Visit (INDEPENDENT_AMBULATORY_CARE_PROVIDER_SITE_OTHER): Payer: Medicare Other | Admitting: Family Medicine

## 2020-06-29 ENCOUNTER — Other Ambulatory Visit: Payer: Self-pay

## 2020-06-29 VITALS — BP 180/90 | HR 62 | Temp 97.9°F | Ht 67.0 in | Wt 209.0 lb

## 2020-06-29 DIAGNOSIS — R410 Disorientation, unspecified: Secondary | ICD-10-CM

## 2020-06-29 DIAGNOSIS — R0601 Orthopnea: Secondary | ICD-10-CM

## 2020-06-29 DIAGNOSIS — J189 Pneumonia, unspecified organism: Secondary | ICD-10-CM

## 2020-06-29 DIAGNOSIS — M5412 Radiculopathy, cervical region: Secondary | ICD-10-CM | POA: Diagnosis not present

## 2020-06-29 DIAGNOSIS — F419 Anxiety disorder, unspecified: Secondary | ICD-10-CM

## 2020-06-29 LAB — TROPONIN I (HIGH SENSITIVITY): Troponin I (High Sensitivity): 8 ng/L (ref <18)

## 2020-06-29 MED ORDER — LEVOFLOXACIN 500 MG PO TABS: 500.0000 mg | ORAL_TABLET | Freq: Every day | ORAL | 0 refills | Status: DC

## 2020-06-29 MED ORDER — PREDNISONE 20 MG PO TABS: ORAL_TABLET | ORAL | 0 refills | Status: DC

## 2020-06-29 MED ORDER — ALBUTEROL SULFATE HFA 108 (90 BASE) MCG/ACT IN AERS: 2.0000 | INHALATION_SPRAY | Freq: Four times a day (QID) | RESPIRATORY_TRACT | 0 refills | Status: DC | PRN

## 2020-06-29 NOTE — Telephone Encounter (Signed)
Patient daughter called the after-hours line on [phone].  States that her father was in the emergency room 2 nights ago with difficulty breathing he had negative Covid test had chest x-rays and labs done but the wait time was too long therefore they left without being seen.  Today the family called her over there emergently because he was diaphoretic pale shaking his blood pressure when she got there was 207/100 and something she gave him a losartan pill.  Advised her to call EMS and taken back to the emergency room she voiced understanding, though she did seem reluctant as they stated that it had long wait before. Advised her that our wait times in the emergency room are high in setting of the pandemic but with his symptoms he needs to be seen urgently
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
## 2020-06-30 ENCOUNTER — Other Ambulatory Visit: Payer: Self-pay | Admitting: Family Medicine

## 2020-06-30 LAB — SARS-COV-2 RNA,(COVID-19) QUALITATIVE NAAT: SARS CoV2 RNA: NOT DETECTED

## 2020-06-30 NOTE — Telephone Encounter (Signed)
Ok to refill??  Last office visit 06/29/2020.  Last refill 06/10/2020.

## 2020-07-01 ENCOUNTER — Encounter: Payer: Self-pay | Admitting: Family Medicine

## 2020-07-01 LAB — HM DIABETES EYE EXAM

## 2020-07-02 ENCOUNTER — Telehealth: Payer: Self-pay | Admitting: Family Medicine

## 2020-07-02 ENCOUNTER — Other Ambulatory Visit: Payer: Self-pay

## 2020-07-02 ENCOUNTER — Ambulatory Visit (INDEPENDENT_AMBULATORY_CARE_PROVIDER_SITE_OTHER): Payer: Medicare Other | Admitting: Family Medicine

## 2020-07-02 VITALS — BP 120/60 | HR 77 | Temp 98.1°F | Ht 67.0 in | Wt 209.0 lb

## 2020-07-02 DIAGNOSIS — J441 Chronic obstructive pulmonary disease with (acute) exacerbation: Secondary | ICD-10-CM | POA: Diagnosis not present

## 2020-07-02 DIAGNOSIS — M5412 Radiculopathy, cervical region: Secondary | ICD-10-CM | POA: Diagnosis not present

## 2020-07-02 DIAGNOSIS — J189 Pneumonia, unspecified organism: Secondary | ICD-10-CM | POA: Diagnosis not present

## 2020-07-02 DIAGNOSIS — R410 Disorientation, unspecified: Secondary | ICD-10-CM

## 2020-07-02 MED ORDER — TRELEGY ELLIPTA 100-62.5-25 MCG/INH IN AEPB: 1.0000 | INHALATION_SPRAY | Freq: Every morning | RESPIRATORY_TRACT | 5 refills | Status: DC

## 2020-07-02 MED ORDER — HYDROCODONE-ACETAMINOPHEN 5-325 MG PO TABS: 1.0000 | ORAL_TABLET | Freq: Four times a day (QID) | ORAL | 0 refills | Status: DC | PRN

## 2020-07-02 MED ORDER — ALBUTEROL SULFATE HFA 108 (90 BASE) MCG/ACT IN AERS: 2.0000 | INHALATION_SPRAY | Freq: Four times a day (QID) | RESPIRATORY_TRACT | 0 refills | Status: DC | PRN
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
## 2020-07-02 NOTE — Telephone Encounter (Signed)
Megan daughter of patient called in stating tha tthe inhaler fluticasone-umeclidin is over $400. She would like to know if there is an alternative for this or a different pharmacy that might be cheaper and or if we have coupons. She also would like to know how long this inhaler is supposed to last.  CB# [phone]

## 2020-07-02 NOTE — Telephone Encounter (Signed)
Todd [name], Does she know if his insurance will cover advair, symbicort, breo, spiriva, instead.  I would be glad to switch to any covered alternative.

## 2020-07-02 NOTE — Telephone Encounter (Signed)
No other alternatives were given.   Per formulary, Breo, Symbicort, Spiriva, Breztri, and Adviar are all preferred medications at Tier 6. Noted some quantity limits may apply.   Patient or family will need to contact insurance to determine cost.   Call placed to patient. Todd [name].

## 2020-07-04 LAB — HM DIABETES EYE EXAM

## 2020-07-05 NOTE — Telephone Encounter (Signed)
Call placed to patient. No answer. No VM.  

## 2020-07-08 NOTE — Telephone Encounter (Signed)
Multiple calls placed to patient with no answer and no return call.   Message to be closed.  

## 2020-07-09 ENCOUNTER — Other Ambulatory Visit: Payer: Self-pay

## 2020-07-09 ENCOUNTER — Ambulatory Visit (INDEPENDENT_AMBULATORY_CARE_PROVIDER_SITE_OTHER): Payer: Medicare Other | Admitting: Family Medicine

## 2020-07-09 ENCOUNTER — Other Ambulatory Visit: Payer: Self-pay | Admitting: Family Medicine

## 2020-07-09 VITALS — BP 130/80 | HR 76 | Temp 97.7°F | Ht 67.0 in | Wt 209.0 lb

## 2020-07-09 DIAGNOSIS — R410 Disorientation, unspecified: Secondary | ICD-10-CM | POA: Diagnosis not present

## 2020-07-09 DIAGNOSIS — M5412 Radiculopathy, cervical region: Secondary | ICD-10-CM

## 2020-07-09 DIAGNOSIS — J441 Chronic obstructive pulmonary disease with (acute) exacerbation: Secondary | ICD-10-CM

## 2020-07-09 DIAGNOSIS — F419 Anxiety disorder, unspecified: Secondary | ICD-10-CM

## 2020-07-09 DIAGNOSIS — J189 Pneumonia, unspecified organism: Secondary | ICD-10-CM | POA: Diagnosis not present

## 2020-07-09 MED ORDER — CELECOXIB 200 MG PO CAPS: 200.0000 mg | ORAL_CAPSULE | Freq: Two times a day (BID) | ORAL | 1 refills | Status: DC

## 2020-07-09 MED ORDER — ALBUTEROL SULFATE HFA 108 (90 BASE) MCG/ACT IN AERS: 2.0000 | INHALATION_SPRAY | Freq: Four times a day (QID) | RESPIRATORY_TRACT | 0 refills | Status: AC | PRN
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
## 2020-07-20 ENCOUNTER — Telehealth: Payer: Self-pay

## 2020-07-20 NOTE — Telephone Encounter (Signed)
Patients spouse calling and wants to discuss breathing and other concerns she has about his health conditions.

## 2020-07-22 ENCOUNTER — Telehealth: Payer: Self-pay

## 2020-07-22 NOTE — Telephone Encounter (Signed)
Pt's wife notified. She is not sure how many times he is using it but she will pay attention. Wife wants to wait until after the xray before she brings him in. He is scheduled for xray on 09/17.

## 2020-07-22 NOTE — Telephone Encounter (Signed)
If he is using emergency inhaler more than 4 times a day, I need to see him because he may need prednisone or additional therapy.

## 2020-07-22 NOTE — Telephone Encounter (Signed)
Pt's daughter called wanted to know if pt can use his emergency inhaler more often than every 6 hours? Pt has cut back on smoking about 5-6 cigs a day and wants to know if the chantix would benefit. Pt is having increased edema in legs and ankles with superficial bruising. Please advise.

## 2020-07-23 ENCOUNTER — Ambulatory Visit
Admission: RE | Admit: 2020-07-23 | Discharge: 2020-07-23 | Disposition: A | Discharge status: Home / Self Care | Payer: Medicare Other | Source: Ambulatory Visit | Attending: Family Medicine | Admitting: Family Medicine

## 2020-07-23 DIAGNOSIS — J189 Pneumonia, unspecified organism: Secondary | ICD-10-CM

## 2020-07-26 ENCOUNTER — Other Ambulatory Visit: Payer: Self-pay | Admitting: Family Medicine

## 2020-07-26 DIAGNOSIS — R911 Solitary pulmonary nodule: Secondary | ICD-10-CM

## 2020-07-27 ENCOUNTER — Ambulatory Visit (INDEPENDENT_AMBULATORY_CARE_PROVIDER_SITE_OTHER): Payer: Medicare Other | Admitting: Family Medicine

## 2020-07-27 ENCOUNTER — Other Ambulatory Visit: Payer: Self-pay

## 2020-07-27 VITALS — BP 140/80 | HR 76 | Temp 97.7°F | Ht 67.0 in | Wt 209.0 lb

## 2020-07-27 DIAGNOSIS — F411 Generalized anxiety disorder: Secondary | ICD-10-CM

## 2020-07-27 DIAGNOSIS — J441 Chronic obstructive pulmonary disease with (acute) exacerbation: Secondary | ICD-10-CM

## 2020-07-27 DIAGNOSIS — F419 Anxiety disorder, unspecified: Secondary | ICD-10-CM | POA: Diagnosis not present

## 2020-07-27 DIAGNOSIS — J9819 Other pulmonary collapse: Secondary | ICD-10-CM

## 2020-07-27 DIAGNOSIS — Z23 Encounter for immunization: Secondary | ICD-10-CM | POA: Diagnosis not present

## 2020-07-27 MED ORDER — ALBUTEROL SULFATE HFA 108 (90 BASE) MCG/ACT IN AERS: 2.0000 | INHALATION_SPRAY | Freq: Four times a day (QID) | RESPIRATORY_TRACT | 5 refills | Status: DC | PRN

## 2020-07-27 MED ORDER — DIAZEPAM 5 MG PO TABS: 5.0000 mg | ORAL_TABLET | Freq: Three times a day (TID) | ORAL | 1 refills | Status: DC | PRN

## 2020-07-27 MED ORDER — ESCITALOPRAM OXALATE 10 MG PO TABS: 10.0000 mg | ORAL_TABLET | Freq: Every day | ORAL | 5 refills | Status: DC
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
## 2020-07-29 ENCOUNTER — Encounter: Payer: Medicare Other | Admitting: Family Medicine

## 2020-07-30 ENCOUNTER — Ambulatory Visit
Admission: RE | Admit: 2020-07-30 | Discharge: 2020-07-30 | Disposition: A | Discharge status: Home / Self Care | Payer: Medicare Other | Source: Ambulatory Visit | Attending: Family Medicine | Admitting: Family Medicine

## 2020-07-30 DIAGNOSIS — J439 Emphysema, unspecified: Secondary | ICD-10-CM | POA: Diagnosis not present

## 2020-07-30 DIAGNOSIS — J9809 Other diseases of bronchus, not elsewhere classified: Secondary | ICD-10-CM | POA: Diagnosis not present

## 2020-07-30 DIAGNOSIS — R911 Solitary pulmonary nodule: Secondary | ICD-10-CM

## 2020-07-30 DIAGNOSIS — I700 Atherosclerosis of aorta: Secondary | ICD-10-CM | POA: Diagnosis not present

## 2020-07-30 MED ORDER — IOPAMIDOL (ISOVUE-300) INJECTION 61%
75.0000 mL | Freq: Once | INTRAVENOUS | Status: AC | PRN
  Administered 2020-07-30: 75 mL via INTRAVENOUS

## 2020-08-02 ENCOUNTER — Other Ambulatory Visit: Payer: Self-pay | Admitting: Family Medicine

## 2020-08-02 DIAGNOSIS — J9819 Other pulmonary collapse: Secondary | ICD-10-CM

## 2020-08-06 ENCOUNTER — Ambulatory Visit (INDEPENDENT_AMBULATORY_CARE_PROVIDER_SITE_OTHER): Payer: Medicare Other | Admitting: Emergency Medicine

## 2020-08-06 ENCOUNTER — Other Ambulatory Visit: Payer: Self-pay

## 2020-08-06 ENCOUNTER — Encounter: Payer: Self-pay | Admitting: Emergency Medicine

## 2020-08-06 DIAGNOSIS — R9389 Abnormal findings on diagnostic imaging of other specified body structures: Secondary | ICD-10-CM

## 2020-08-06 DIAGNOSIS — J9811 Atelectasis: Secondary | ICD-10-CM | POA: Insufficient documentation

## 2020-08-06 NOTE — Assessment & Plan Note (Signed)
Partial right middle lobe collapse with narrowing of his right middle lobe bronchus and apparent endobronchial lesion.  This extends into his right mainstem and then distally into the bronchus intermedius.  I suspect malignancy.  I explained this to him today.  I recommended inspection bronchoscopy, possible endobronchial biopsies.  He understands and wants to think about it, talk to his family.  He is going to call me and let me know if and when he wants to proceed with bronchoscopy.  Your CT scan of the chest shows partial right middle lobe collapse due to to narrowing of your right middle lobe airway.  This is suspicious for an airway lesion or blockage. Dr. Lamonte Sakai has recommended an inspection bronchoscopy to evaluate your airways, possibly sample any abnormality that is blocking the right middle lobe bronchus. Please call our office and let us know if you would like for [place] to arrange for this procedure. Follow with Dr Lamonte Sakai in 1 month

## 2020-08-06 NOTE — H&P (View-Only) (Signed)
 Subjective:    Patient ID: Todd [name], male    DOB: 10/19/1961, 59 y.o.   MRN: [id number]  HPI 59-year-old active smoker (40 pack years) with a history of diabetes, CAD/MI (PTCI to LAD, medical management), hyperlipidemia, hypertension.  He is referred today for an abnormal CT scan of the chest.  He underwent a chest x-ray 07/25/2020 for cough that showed persistent right middle lobe consolidation.  A CT chest done 08/01/2020 was reviewed by me, shows some emphysematous change bilaterally, an area of large patchy consolidation, rounded, in the right middle lobe with focal narrowing of the origin of his right middle lobe bronchus and mild nodularity of his right mainstem bronchus extending distally.  There is also a 4 mm right middle lobe nodule.  Review of Systems As per Hpi  Past Medical History:  Diagnosis Date  . Arthritis   . CAD (coronary artery disease)    NSTEMI with DES to LAD; 100% nondominant RCA with collaterals - EF is normal.   . 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 Complication of anesthesia    slow to wake up   . Diabetes mellitus type II, non insulin dependent (HCC)   . HLD (hyperlipidemia)   . Hydrocele in adult    confirmed by us, elected to monitor after seeing urology.   . Hyperlipidemia   . Hypertension   . Skin abnormalities    affecting right hand thumb, pointer, and index fingers and palm; left hand thumb pointer and index finger. peeling/cracked/blister skin , warm to touch. scattered areas of superficial skin loss esp to R/L anterior index digits. glossy appearance and edema, reports itchiness, numbness, and pain, no drainage, occurs intermittently, ongoing for years, unsure of triggers, temp.relief w/steroids   . Tobacco abuse      Family History  Problem Relation Age of Onset  . Heart disease Mother 58  . Hypertension Father      Social History   Socioeconomic History  . Marital status: Married    Spouse name: Not on file    . Number of children: Not on file  . Years of education: Not on file  . Highest education level: Not on file  Occupational History  . Not on file  Tobacco Use  . Smoking status: Current Every Day Smoker    Packs/day: 0.50    Years: 40.00    Pack years: 20.00    Types: Cigarettes, Cigars  . Smokeless tobacco: Never Used  . Tobacco comment: 2 cigarettes a day 08/06/20 ARJ   Substance and Sexual Activity  . Alcohol use: Yes    Comment: 2-3 BEERS PER WEEK   . Drug use: No  . Sexual activity: Yes    Comment: married, 2 kids.  Trying to quit smoking.  Other Topics Concern  . Not on file  Social History Narrative  . Not on file   Social Determinants of Health   Financial Resource Strain:   . Difficulty of Paying Living Expenses: Not on file  Food Insecurity:   . Worried About Running Out of Food in the Last Year: Not on file  . Ran Out of Food in the Last Year: Not on file  Transportation Needs:   . Lack of Transportation (Medical): Not on file  . Lack of Transportation (Non-Medical): Not on file  Physical Activity:   . Days of Exercise per Week: Not on file  . Minutes of Exercise per Session: Not on file    Stress:   . Feeling of Stress : Not on file  Social Connections:   . Frequency of Communication with Friends and Family: Not on file  . Frequency of Social Gatherings with Friends and Family: Not on file  . Attends Religious Services: Not on file  . Active Member of Clubs or Organizations: Not on file  . Attends Club or Organization Meetings: Not on file  . Marital Status: Not on file  Intimate Partner Violence:   . Fear of Current or Ex-Partner: Not on file  . Emotionally Abused: Not on file  . Physically Abused: Not on file  . Sexually Abused: Not on file     Allergies  Allergen Reactions  . Dust Mite Extract Anaphylaxis  . Other Swelling, Rash and Other (See Comments)    grass  . Aspirin Nausea Only and Other (See Comments)    Patient can tolerate 81 mg ONLY   . Latex Other (See Comments)    Unknown  . Ibuprofen Nausea And Vomiting and Other (See Comments)    Causes Severe headaches     Outpatient Medications Prior to Visit  Medication Sig Dispense Refill  . albuterol (VENTOLIN HFA) 108 (90 Base) MCG/ACT inhaler Inhale 2 puffs into the lungs every 6 (six) hours as needed for wheezing or shortness of breath. 8 g 0  . albuterol (VENTOLIN HFA) 108 (90 Base) MCG/ACT inhaler Inhale 2 puffs into the lungs every 6 (six) hours as needed for wheezing or shortness of breath. 1 each 5  . aspirin EC 81 MG tablet Take 81 mg by mouth daily.    . atorvastatin (LIPITOR) 80 MG tablet TAKE 1 TABLET BY MOUTH DAILY IN THE EVENING AT 6:00PM 90 tablet 1  . blood glucose meter kit and supplies 1 each by Other route as directed. Dispense based on patient and insurance preference. Use up to four times daily as directed. (FOR ICD-10 E10.9, E11.9). 1 each 0  . calcium carbonate (TUMS - DOSED IN MG ELEMENTAL CALCIUM) 500 MG chewable tablet Chew 2 tablets by mouth daily as needed for indigestion or heartburn.    . carvedilol (COREG) 12.5 MG tablet TAKE 1 TABLET BY MOUTH TWICE A DAY WITH A MEAL 180 tablet 3  . celecoxib (CELEBREX) 200 MG capsule TAKE 1 CAPSULE(200 MG) BY MOUTH TWICE DAILY 180 capsule 3  . diazepam (VALIUM) 5 MG tablet Take 1 tablet (5 mg total) by mouth every 8 (eight) hours as needed for anxiety. 60 tablet 1  . diphenhydrAMINE (BENADRYL) 25 MG tablet Take 1 tablet (25 mg total) by mouth every 6 (six) hours as needed for itching or allergies (Rash). 30 tablet 0  . EPINEPHrine (EPIPEN 2-PAK) 0.3 mg/0.3 mL IJ SOAJ injection Inject 0.3 mLs (0.3 mg total) into the muscle once as needed (for severe allergic reaction). CAll 911 immediately if you have to use this medicine 2 Device 1  . escitalopram (LEXAPRO) 10 MG tablet Take 1 tablet (10 mg total) by mouth daily. 30 tablet 5  . famotidine (PEPCID) 20 MG tablet TAKE 1 TABLET BY MOUTH DAILY AS NEEDED FOR HEARTBURN OR  INDIGESTION. 30 tablet 11  . Fluticasone-Umeclidin-Vilant (TRELEGY ELLIPTA) 100-62.5-25 MCG/INH AEPB Inhale 1 Inhaler into the lungs every morning. 1 each 5  . glipiZIDE (GLUCOTROL) 5 MG tablet TAKE 1 TABLET BY MOUTH IN THE MORNING BEFORE BREAKFAST 90 tablet 1  . HYDROcodone-acetaminophen (NORCO) 5-325 MG tablet Take 1 tablet by mouth every 6 (six) hours as needed for moderate pain. 30 tablet   0  . levofloxacin (LEVAQUIN) 500 MG tablet Take 1 tablet (500 mg total) by mouth daily. 7 tablet 0  . losartan-hydrochlorothiazide (HYZAAR) 50-12.5 MG tablet Take 1 tablet by mouth daily. 90 tablet 3  . metFORMIN (GLUCOPHAGE) 500 MG tablet TAKE 2 TABLETS BY MOUTH TWICE DAILY 120 tablet 3  . metFORMIN (GLUCOPHAGE) 500 MG tablet TAKE 1 TABLET BY MOUTH TWICE DAILY FOR 1 WEEK THEN TAKE 2 TABLETS BY MOUTH TWICE DAILY THEREAFTER 120 tablet 3  . nitroGLYCERIN (NITROSTAT) 0.4 MG SL tablet Place 1 tablet (0.4 mg total) under the tongue every 5 (five) minutes as needed for chest pain. 25 tablet 6  . pantoprazole (PROTONIX) 40 MG tablet Take 1 tablet (40 mg total) by mouth daily. 30 tablet 3  . vitamin C (ASCORBIC ACID) 250 MG tablet Take 2 tablets 500 mg daily     No facility-administered medications prior to visit.         Objective:   Physical Exam  Vitals:   08/06/20 1556  BP: 140/72  Pulse: 68  Temp: (!) 97.5 F (36.4 C)  TempSrc: Temporal  SpO2: 96%  Weight: 207 lb 6.4 oz (94.1 kg)  Height: 5' 7" (1.702 m)   Gen: Pleasant, elderly man, in no distress,  normal affect  ENT: No lesions,  mouth clear,  oropharynx clear, no postnasal drip  Neck: No JVD, no stridor  Lungs: No use of accessory muscles, distant no crackles or wheezing on normal respiration, no wheeze on forced expiration  Cardiovascular: RRR, heart sounds normal, no murmur or gallops, no peripheral edema  Musculoskeletal: No deformities, no cyanosis or clubbing  Neuro: alert, awake, non focal  Skin: Warm, no lesions or rash      Assessment & Plan:  Abnormal CT of the chest Partial right middle lobe collapse with narrowing of his right middle lobe bronchus and apparent endobronchial lesion.  This extends into his right mainstem and then distally into the bronchus intermedius.  I suspect malignancy.  I explained this to him today.  I recommended inspection bronchoscopy, possible endobronchial biopsies.  He understands and wants to think about it, talk to his family.  He is going to call me and let me know if and when he wants to proceed with bronchoscopy.  Your CT scan of the chest shows partial right middle lobe collapse due to to narrowing of your right middle lobe airway.  This is suspicious for an airway lesion or blockage. Dr. Lamonte Sakai has recommended an inspection bronchoscopy to evaluate your airways, possibly sample any abnormality that is blocking the right middle lobe bronchus. Please call our office and let us know if you would like for [place] to arrange for this procedure. Follow with Dr Lamonte Sakai in 1 month  [name], MD, PhD 08/06/2020, 4:26 PM [facility] [phone] or if no answer [phone]

## 2020-08-06 NOTE — Patient Instructions (Signed)
Your CT scan of the chest shows partial right middle lobe collapse due to to narrowing of your right middle lobe airway.  This is suspicious for an airway lesion or blockage. Dr. Lamonte Sakai has recommended an inspection bronchoscopy to evaluate your airways, possibly sample any abnormality that is blocking the right middle lobe bronchus. Please call our office and let us know if you would like for [place] to arrange for this procedure. Follow with Dr Lamonte Sakai in 1 month

## 2020-08-06 NOTE — Progress Notes (Signed)
 Subjective:    Patient ID: Todd [name], male    DOB: 05/25/1961, 59 y.o.   MRN: [id number]  HPI 59-year-old active smoker (40 pack years) with a history of diabetes, CAD/MI (PTCI to LAD, medical management), hyperlipidemia, hypertension.  He is referred today for an abnormal CT scan of the chest.  He underwent a chest x-ray 07/25/2020 for cough that showed persistent right middle lobe consolidation.  A CT chest done 08/01/2020 was reviewed by me, shows some emphysematous change bilaterally, an area of large patchy consolidation, rounded, in the right middle lobe with focal narrowing of the origin of his right middle lobe bronchus and mild nodularity of his right mainstem bronchus extending distally.  There is also a 4 mm right middle lobe nodule.  Review of Systems As per Hpi  Past Medical History:  Diagnosis Date  . Arthritis   . CAD (coronary artery disease)    NSTEMI with DES to LAD; 100% nondominant RCA with collaterals - EF is normal.   . 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 Complication of anesthesia    slow to wake up   . Diabetes mellitus type II, non insulin dependent (HCC)   . HLD (hyperlipidemia)   . Hydrocele in adult    confirmed by us, elected to monitor after seeing urology.   . Hyperlipidemia   . Hypertension   . Skin abnormalities    affecting right hand thumb, pointer, and index fingers and palm; left hand thumb pointer and index finger. peeling/cracked/blister skin , warm to touch. scattered areas of superficial skin loss esp to R/L anterior index digits. glossy appearance and edema, reports itchiness, numbness, and pain, no drainage, occurs intermittently, ongoing for years, unsure of triggers, temp.relief w/steroids   . Tobacco abuse      Family History  Problem Relation Age of Onset  . Heart disease Mother 58  . Hypertension Father      Social History   Socioeconomic History  . Marital status: Married    Spouse name: Not on file    . Number of children: Not on file  . Years of education: Not on file  . Highest education level: Not on file  Occupational History  . Not on file  Tobacco Use  . Smoking status: Current Every Day Smoker    Packs/day: 0.50    Years: 40.00    Pack years: 20.00    Types: Cigarettes, Cigars  . Smokeless tobacco: Never Used  . Tobacco comment: 2 cigarettes a day 08/06/20 ARJ   Substance and Sexual Activity  . Alcohol use: Yes    Comment: 2-3 BEERS PER WEEK   . Drug use: No  . Sexual activity: Yes    Comment: married, 2 kids.  Trying to quit smoking.  Other Topics Concern  . Not on file  Social History Narrative  . Not on file   Social Determinants of Health   Financial Resource Strain:   . Difficulty of Paying Living Expenses: Not on file  Food Insecurity:   . Worried About Running Out of Food in the Last Year: Not on file  . Ran Out of Food in the Last Year: Not on file  Transportation Needs:   . Lack of Transportation (Medical): Not on file  . Lack of Transportation (Non-Medical): Not on file  Physical Activity:   . Days of Exercise per Week: Not on file  . Minutes of Exercise per Session: Not on file    Stress:   . Feeling of Stress : Not on file  Social Connections:   . Frequency of Communication with Friends and Family: Not on file  . Frequency of Social Gatherings with Friends and Family: Not on file  . Attends Religious Services: Not on file  . Active Member of Clubs or Organizations: Not on file  . Attends Club or Organization Meetings: Not on file  . Marital Status: Not on file  Intimate Partner Violence:   . Fear of Current or Ex-Partner: Not on file  . Emotionally Abused: Not on file  . Physically Abused: Not on file  . Sexually Abused: Not on file     Allergies  Allergen Reactions  . Dust Mite Extract Anaphylaxis  . Other Swelling, Rash and Other (See Comments)    grass  . Aspirin Nausea Only and Other (See Comments)    Patient can tolerate 81 mg ONLY   . Latex Other (See Comments)    Unknown  . Ibuprofen Nausea And Vomiting and Other (See Comments)    Causes Severe headaches     Outpatient Medications Prior to Visit  Medication Sig Dispense Refill  . albuterol (VENTOLIN HFA) 108 (90 Base) MCG/ACT inhaler Inhale 2 puffs into the lungs every 6 (six) hours as needed for wheezing or shortness of breath. 8 g 0  . albuterol (VENTOLIN HFA) 108 (90 Base) MCG/ACT inhaler Inhale 2 puffs into the lungs every 6 (six) hours as needed for wheezing or shortness of breath. 1 each 5  . aspirin EC 81 MG tablet Take 81 mg by mouth daily.    . atorvastatin (LIPITOR) 80 MG tablet TAKE 1 TABLET BY MOUTH DAILY IN THE EVENING AT 6:00PM 90 tablet 1  . blood glucose meter kit and supplies 1 each by Other route as directed. Dispense based on patient and insurance preference. Use up to four times daily as directed. (FOR ICD-10 E10.9, E11.9). 1 each 0  . calcium carbonate (TUMS - DOSED IN MG ELEMENTAL CALCIUM) 500 MG chewable tablet Chew 2 tablets by mouth daily as needed for indigestion or heartburn.    . carvedilol (COREG) 12.5 MG tablet TAKE 1 TABLET BY MOUTH TWICE A DAY WITH A MEAL 180 tablet 3  . celecoxib (CELEBREX) 200 MG capsule TAKE 1 CAPSULE(200 MG) BY MOUTH TWICE DAILY 180 capsule 3  . diazepam (VALIUM) 5 MG tablet Take 1 tablet (5 mg total) by mouth every 8 (eight) hours as needed for anxiety. 60 tablet 1  . diphenhydrAMINE (BENADRYL) 25 MG tablet Take 1 tablet (25 mg total) by mouth every 6 (six) hours as needed for itching or allergies (Rash). 30 tablet 0  . EPINEPHrine (EPIPEN 2-PAK) 0.3 mg/0.3 mL IJ SOAJ injection Inject 0.3 mLs (0.3 mg total) into the muscle once as needed (for severe allergic reaction). CAll 911 immediately if you have to use this medicine 2 Device 1  . escitalopram (LEXAPRO) 10 MG tablet Take 1 tablet (10 mg total) by mouth daily. 30 tablet 5  . famotidine (PEPCID) 20 MG tablet TAKE 1 TABLET BY MOUTH DAILY AS NEEDED FOR HEARTBURN OR  INDIGESTION. 30 tablet 11  . Fluticasone-Umeclidin-Vilant (TRELEGY ELLIPTA) 100-62.5-25 MCG/INH AEPB Inhale 1 Inhaler into the lungs every morning. 1 each 5  . glipiZIDE (GLUCOTROL) 5 MG tablet TAKE 1 TABLET BY MOUTH IN THE MORNING BEFORE BREAKFAST 90 tablet 1  . HYDROcodone-acetaminophen (NORCO) 5-325 MG tablet Take 1 tablet by mouth every 6 (six) hours as needed for moderate pain. 30 tablet   0  . levofloxacin (LEVAQUIN) 500 MG tablet Take 1 tablet (500 mg total) by mouth daily. 7 tablet 0  . losartan-hydrochlorothiazide (HYZAAR) 50-12.5 MG tablet Take 1 tablet by mouth daily. 90 tablet 3  . metFORMIN (GLUCOPHAGE) 500 MG tablet TAKE 2 TABLETS BY MOUTH TWICE DAILY 120 tablet 3  . metFORMIN (GLUCOPHAGE) 500 MG tablet TAKE 1 TABLET BY MOUTH TWICE DAILY FOR 1 WEEK THEN TAKE 2 TABLETS BY MOUTH TWICE DAILY THEREAFTER 120 tablet 3  . nitroGLYCERIN (NITROSTAT) 0.4 MG SL tablet Place 1 tablet (0.4 mg total) under the tongue every 5 (five) minutes as needed for chest pain. 25 tablet 6  . pantoprazole (PROTONIX) 40 MG tablet Take 1 tablet (40 mg total) by mouth daily. 30 tablet 3  . vitamin C (ASCORBIC ACID) 250 MG tablet Take 2 tablets 500 mg daily     No facility-administered medications prior to visit.         Objective:   Physical Exam  Vitals:   08/06/20 1556  BP: 140/72  Pulse: 68  Temp: (!) 97.5 F (36.4 C)  TempSrc: Temporal  SpO2: 96%  Weight: 207 lb 6.4 oz (94.1 kg)  Height: 5' 7" (1.702 m)   Gen: Pleasant, elderly man, in no distress,  normal affect  ENT: No lesions,  mouth clear,  oropharynx clear, no postnasal drip  Neck: No JVD, no stridor  Lungs: No use of accessory muscles, distant no crackles or wheezing on normal respiration, no wheeze on forced expiration  Cardiovascular: RRR, heart sounds normal, no murmur or gallops, no peripheral edema  Musculoskeletal: No deformities, no cyanosis or clubbing  Neuro: alert, awake, non focal  Skin: Warm, no lesions or rash      Assessment & Plan:  Abnormal CT of the chest Partial right middle lobe collapse with narrowing of his right middle lobe bronchus and apparent endobronchial lesion.  This extends into his right mainstem and then distally into the bronchus intermedius.  I suspect malignancy.  I explained this to him today.  I recommended inspection bronchoscopy, possible endobronchial biopsies.  He understands and wants to think about it, talk to his family.  He is going to call me and let me know if and when he wants to proceed with bronchoscopy.  Your CT scan of the chest shows partial right middle lobe collapse due to to narrowing of your right middle lobe airway.  This is suspicious for an airway lesion or blockage. Dr. Lamonte Sakai has recommended an inspection bronchoscopy to evaluate your airways, possibly sample any abnormality that is blocking the right middle lobe bronchus. Please call our office and let us know if you would like for [place] to arrange for this procedure. Follow with Dr Lamonte Sakai in 1 month  [name], MD, PhD 08/06/2020, 4:26 PM [facility] [phone] or if no answer [phone]

## 2020-08-13 ENCOUNTER — Telehealth: Payer: Self-pay | Admitting: Emergency Medicine

## 2020-08-13 DIAGNOSIS — R9389 Abnormal findings on diagnostic imaging of other specified body structures: Secondary | ICD-10-CM

## 2020-08-13 NOTE — Telephone Encounter (Signed)
Left message for patient to call back  

## 2020-08-16 NOTE — Telephone Encounter (Signed)
ATC pt, call went straight to VM. LMTCB x2 for pt.

## 2020-08-17 NOTE — Telephone Encounter (Signed)
Called and spoke to pt at [phone]. Pt is ready to schedule his bronchoscopy. Pt is also requesting for Dr. Lamonte Sakai to see if he needs to hold any medications prior to the procedure.   Dr. Lamonte Sakai please advise. Thanks.

## 2020-08-18 ENCOUNTER — Institutional Professional Consult (permissible substitution): Payer: Medicare Other | Admitting: Emergency Medicine

## 2020-08-18 NOTE — Telephone Encounter (Signed)
Please let him know I placed request to get it scheduled, hopefully one morning next week.

## 2020-08-18 NOTE — Telephone Encounter (Signed)
LMTCB for the pt 

## 2020-08-19 NOTE — Telephone Encounter (Signed)
Called and spoke to pt. Informed him of the recs per Dr. Lamonte Sakai. Will keep message open to follow up on.

## 2020-08-20 ENCOUNTER — Telehealth: Payer: Self-pay | Admitting: *Deleted

## 2020-08-20 NOTE — Telephone Encounter (Signed)
-----   Message from Ilona Sorrel sent at 08/18/2020  9:25 AM EDT ----- Regarding: bronch Dr Lamonte Sakai put in this order that hit our workque -   Please schedule the following: FOB for [place] Diagnosis: RML collapse Procedure: standard bronch with biopsies  Anesthesia: MAC Do you need Fluro? no Priority: high Date: 10/18, 10/20 or 10/21  Time: AM before office at 9:00 Location: [facility] Does patient have OSA? no DM? yes Or Latex allergy? yes Medication Restriction: no Anticoagulate/Antiplatelet: stop ASA 2 days prior Pre-op Labs Ordered: CBC, CMP, PT/INR, PTT Imaging request: none  Please coordinate Pre-op COVID Testing

## 2020-08-23 ENCOUNTER — Other Ambulatory Visit (HOSPITAL_COMMUNITY): Payer: Medicare Other

## 2020-08-23 NOTE — Telephone Encounter (Signed)
Patient checking on Bronch procedure. States labs were cancelled at [facility]. Patient phone number is [phone].

## 2020-08-23 NOTE — Telephone Encounter (Signed)
LMTCB for the pt  It looks like the bronch was supposed to be today but the pt was never informed

## 2020-08-24 ENCOUNTER — Other Ambulatory Visit: Payer: Self-pay

## 2020-08-24 ENCOUNTER — Other Ambulatory Visit (HOSPITAL_COMMUNITY)
Admission: RE | Admit: 2020-08-24 | Discharge: 2020-08-24 | Disposition: A | Discharge status: Home / Self Care | Payer: Medicare Other | Source: Ambulatory Visit | Attending: Emergency Medicine | Admitting: Emergency Medicine

## 2020-08-24 ENCOUNTER — Encounter (HOSPITAL_COMMUNITY): Payer: Self-pay | Admitting: Emergency Medicine

## 2020-08-24 DIAGNOSIS — Z20822 Contact with and (suspected) exposure to covid-19: Secondary | ICD-10-CM | POA: Diagnosis not present

## 2020-08-24 LAB — SARS CORONAVIRUS 2 (TAT 6-24 HRS): SARS Coronavirus 2: NEGATIVE

## 2020-08-24 NOTE — Telephone Encounter (Signed)
ATC patient to let him know that he should get a call tomorrow from hospital with all information. LMTCB

## 2020-08-24 NOTE — Telephone Encounter (Signed)
Called patient let him know to be there at 5:30 per [facility] in Endo. Let patient know to stop his aspirin until after the procedure. I let him know not to eat or drink after midnight and that he needed to make sure someone drove  Him to and home from procedure. Patient is going to go today to get covid swabbed at 2:55 at [facility] location.   Nothing further needed at this time.

## 2020-08-24 NOTE — Telephone Encounter (Signed)
Just spoke with Larene Beach in Endo. They have a spot on Thursday 10/21 in [facility]. She should be contacting you this am. Thanks.

## 2020-08-24 NOTE — Progress Notes (Signed)
Mr. Todd [name] denies shortness of  Breath or s/s of Covid.  Patient was tested for Covid and in in quarantine with his family. Mr. Todd [name] reports that he has cramping at times in his chest and he can massage it out.  Dr. Martinique noted this in his notes in March 2021. Mr. Todd [name] has type II diabetes. Patient reports that CBGs run 115-209.Mr. Todd [name] had me give medications to his wife, I instructed mrs.Niemeier that patient should not take medications for DM the morning of surgery. I instructed patient to check CBG after awaking and every 2 hours until arrival  to the hospital.  I Instructed patient if CBG is less than 70 to take drink 1/2 cup of a clear juice. Recheck CBG in 15 minutes then call pre- op desk at [phone] for further instructions

## 2020-08-24 NOTE — Telephone Encounter (Signed)
The bronch was only arranged today, for Thursday 10/21 because an opening came up. He didn't know about it until today. The Endo lab and anesthesia will call him to prep for the procedure and tell him which meds to take and when. They should call him tomorrow.

## 2020-08-24 NOTE — Telephone Encounter (Signed)
Called and spoke to pt. Informed pt of the bronch scheduled for 10/21. Pt was unaware.   Pt questioned if he should take all his normal meds the morning of the procedure.   Dr. Lamonte Sakai, please advise if pt should hold any meds the morning of his bronch on 10/21. Thanks.

## 2020-08-25 NOTE — Progress Notes (Addendum)
Anesthesia Chart Review: Same day workup  Follows with cardiology for hx of CAD s/p NSTEMI July 2014 treated with DES to mid LAD. RCA found to be 100% occluded (nondominant) with distal collateralization. EF was 55-65%. He also has a hx of DM, HTN, HLD, tobacco abuse.  In February 2016 he presented with chest pain. He had a follow up Myoview study as an outpatient. This demonstrated ischemia and scarring of the basal and mid inferior wall. EF was 56%. This was felt to be consistent with knownnondominant RCA occlusion. Last seen by Dr. Martinique 01/15/2020. Discussed that he sometimes has a MSK pain in his chest that improves with massage. No anginal symptoms. No changes made to medical therapy. Advised to followup in 1 year.   DMII, last A1c 7.1 on 05/03/20.  Will need DOS labs and eval.  EKG 06/29/20: Sinus bradycardia. Rate 55. LAD. Pulmonary disease pattern. Incomplete RBBB.  CT Chest 07/30/20: IMPRESSION: 1. Large patchy area of consolidation within the right middle lobe which may represent atelectasis or a persistent infectious process, potentially postobstructive. Recommend continued imaging follow-up to ensure resolution. 2. There is marked focal narrowing at the origin of the right middle lobe bronchus which is nonspecific in etiology. Additionally there is mild nodularity of the central right lung bronchus. Recommend pulmonary consultation and further evaluation with bronchoscopy. 3. 4 mm right middle lobe nodule. No follow-up needed if patient is low-risk. Non-contrast chest CT can be considered in 12 months if patient is high-risk. This recommendation follows the consensus statement: Guidelines for Management of Incidental Pulmonary Nodules Detected on CT Images: From the Fleischner Society 2017; Radiology 2017; 284:228-243. 4. Emphysema and aortic atherosclerosis. 5. These results will be called to the ordering clinician or representative by the Radiologist Assistant, and  communication documented in the PACS or Industry Dashboard  Nuclear stress 01/07/15: Overall Impression:  Intermediate risk stress nuclear study with mixed ischemia (dominant) and scar (less significant) in the basal and mid inferior wall..  LV Ejection Fraction: 56%.  LV Wall Motion:  NL LV Function; NL Wall Motion    Wynonia Musty [facility] Short Stay Center/Anesthesiology Phone [phone] 08/25/2020 10:54 AM

## 2020-08-25 NOTE — Anesthesia Preprocedure Evaluation (Addendum)
Anesthesia Evaluation  Patient identified by MRN, date of birth, ID band Patient awake    Reviewed: Allergy & Precautions, NPO status , Patient's Chart, lab work & pertinent test results  Airway Mallampati: II  TM Distance: >3 FB Neck ROM: Full    Dental  (+) Teeth Intact, Dental Advisory Given   Pulmonary Current Smoker and Patient abstained from smoking.,    breath sounds clear to auscultation       Cardiovascular hypertension,  Rhythm:Regular Rate:Normal     Neuro/Psych    GI/Hepatic   Endo/Other  diabetes  Renal/GU      Musculoskeletal   Abdominal   Peds  Hematology   Anesthesia Other Findings   Reproductive/Obstetrics                            Anesthesia Physical Anesthesia Plan  ASA: III  Anesthesia Plan: General   Post-op Pain Management:    Induction: Intravenous  PONV Risk Score and Plan: Ondansetron and Dexamethasone  Airway Management Planned: Oral ETT  Additional Equipment:   Intra-op Plan:   Post-operative Plan: Extubation in OR  Informed Consent: I have reviewed the patients History and Physical, chart, labs and discussed the procedure including the risks, benefits and alternatives for the proposed anesthesia with the patient or authorized representative who has indicated his/her understanding and acceptance.     Dental advisory given  Plan Discussed with: CRNA and Anesthesiologist  Anesthesia Plan Comments: (PAT note by Karoline Caldwell, PA-C: Follows with cardiology for hx of CAD s/p NSTEMI July 2014 treated with DES to mid LAD. RCA found to be 100% occluded (nondominant) with distal collateralization. EF was 55-65%. He also has a hx of DM, HTN, HLD, tobacco abuse.  In February 2016 he presented with chest pain. He had a follow up Myoview study as an outpatient. This demonstrated ischemia and scarring of the basal and mid inferior wall. EF was 56%. This was felt  to be consistent with knownnondominant RCA occlusion. Last seen by Dr. Martinique 01/15/2020. Discussed that he sometimes has a MSK pain in his chest that improves with massage. No anginal symptoms. No changes made to medical therapy. Advised to followup in 1 year.   DMII, last A1c 7.1 on 05/03/20.  Will need DOS labs and eval.  EKG 06/29/20: Sinus bradycardia. Rate 55. LAD. Pulmonary disease pattern. Incomplete RBBB.  CT Chest 07/30/20: IMPRESSION: 1. Large patchy area of consolidation within the right middle lobe which may represent atelectasis or a persistent infectious process, potentially postobstructive. Recommend continued imaging follow-up to ensure resolution. 2. There is marked focal narrowing at the origin of the right middle lobe bronchus which is nonspecific in etiology. Additionally there is mild nodularity of the central right lung bronchus. Recommend pulmonary consultation and further evaluation with bronchoscopy. 3. 4 mm right middle lobe nodule. No follow-up needed if patient is low-risk. Non-contrast chest CT can be considered in 12 months if patient is high-risk. This recommendation follows the consensus statement: Guidelines for Management of Incidental Pulmonary Nodules Detected on CT Images: From the Fleischner Society 2017; Radiology 2017; 284:228-243. 4. Emphysema and aortic atherosclerosis. 5. These results will be called to the ordering clinician or representative by the Radiologist Assistant, and communication documented in the PACS or [place] Dashboard  Nuclear stress 01/07/15: Overall Impression:  Intermediate risk stress nuclear study with mixed ischemia (dominant) and scar (less significant) in the basal and mid inferior wall..  LV Ejection Fraction: 56%.  LV Wall Motion:  NL LV Function; NL Wall Motion   )       Anesthesia Quick Evaluation

## 2020-08-26 ENCOUNTER — Encounter (HOSPITAL_COMMUNITY)
Admission: RE | Disposition: A | Discharge status: Home / Self Care | Payer: Self-pay | Source: Home / Self Care | Attending: Emergency Medicine

## 2020-08-26 ENCOUNTER — Ambulatory Visit (HOSPITAL_COMMUNITY)
Admission: RE | Admit: 2020-08-26 | Discharge: 2020-08-26 | Disposition: A | Discharge status: Home / Self Care | Payer: Medicare Other | Attending: Emergency Medicine | Admitting: Emergency Medicine

## 2020-08-26 ENCOUNTER — Ambulatory Visit (HOSPITAL_COMMUNITY): Payer: Medicare Other

## 2020-08-26 ENCOUNTER — Other Ambulatory Visit: Payer: Self-pay

## 2020-08-26 ENCOUNTER — Ambulatory Visit (HOSPITAL_COMMUNITY): Payer: Medicare Other | Admitting: Physician Assistant

## 2020-08-26 ENCOUNTER — Encounter (HOSPITAL_COMMUNITY): Payer: Self-pay | Admitting: Emergency Medicine

## 2020-08-26 ENCOUNTER — Telehealth: Payer: Self-pay | Admitting: Emergency Medicine

## 2020-08-26 DIAGNOSIS — I10 Essential (primary) hypertension: Secondary | ICD-10-CM | POA: Diagnosis not present

## 2020-08-26 DIAGNOSIS — F17210 Nicotine dependence, cigarettes, uncomplicated: Secondary | ICD-10-CM | POA: Insufficient documentation

## 2020-08-26 DIAGNOSIS — J984 Other disorders of lung: Secondary | ICD-10-CM | POA: Diagnosis not present

## 2020-08-26 DIAGNOSIS — J9819 Other pulmonary collapse: Secondary | ICD-10-CM | POA: Insufficient documentation

## 2020-08-26 DIAGNOSIS — J9809 Other diseases of bronchus, not elsewhere classified: Secondary | ICD-10-CM | POA: Diagnosis not present

## 2020-08-26 DIAGNOSIS — J9811 Atelectasis: Secondary | ICD-10-CM | POA: Diagnosis present

## 2020-08-26 DIAGNOSIS — R9389 Abnormal findings on diagnostic imaging of other specified body structures: Secondary | ICD-10-CM | POA: Diagnosis not present

## 2020-08-26 DIAGNOSIS — J40 Bronchitis, not specified as acute or chronic: Secondary | ICD-10-CM | POA: Diagnosis not present

## 2020-08-26 DIAGNOSIS — I2510 Atherosclerotic heart disease of native coronary artery without angina pectoris: Secondary | ICD-10-CM | POA: Diagnosis not present

## 2020-08-26 DIAGNOSIS — R069 Unspecified abnormalities of breathing: Secondary | ICD-10-CM

## 2020-08-26 DIAGNOSIS — E785 Hyperlipidemia, unspecified: Secondary | ICD-10-CM | POA: Diagnosis not present

## 2020-08-26 HISTORY — DX: Personal history of other medical treatment: Z92.89

## 2020-08-26 HISTORY — PX: BRONCHIAL BIOPSY: SHX5109

## 2020-08-26 HISTORY — DX: Panic disorder (episodic paroxysmal anxiety): F41.0

## 2020-08-26 HISTORY — PX: BRONCHIAL NEEDLE ASPIRATION BIOPSY: SHX5106

## 2020-08-26 HISTORY — PX: BRONCHIAL BRUSHINGS: SHX5108

## 2020-08-26 HISTORY — DX: Gastro-esophageal reflux disease without esophagitis: K21.9

## 2020-08-26 HISTORY — PX: VIDEO BRONCHOSCOPY: SHX5072

## 2020-08-26 HISTORY — DX: Dyspnea, unspecified: R06.00

## 2020-08-26 HISTORY — DX: Acute myocardial infarction, unspecified: I21.9

## 2020-08-26 HISTORY — PX: BRONCHIAL WASHINGS: SHX5105

## 2020-08-26 LAB — CBC
HCT: 46.2 % (ref 39.0–52.0)
Hemoglobin: 15 g/dL (ref 13.0–17.0)
MCH: 30.7 pg (ref 26.0–34.0)
MCHC: 32.5 g/dL (ref 30.0–36.0)
MCV: 94.5 fL (ref 80.0–100.0)
Platelets: 172 10*3/uL (ref 150–400)
RBC: 4.89 MIL/uL (ref 4.22–5.81)
RDW: 13 % (ref 11.5–15.5)
WBC: 7.5 10*3/uL (ref 4.0–10.5)
nRBC: 0 % (ref 0.0–0.2)

## 2020-08-26 LAB — BASIC METABOLIC PANEL
Anion gap: 10 (ref 5–15)
BUN: 5 mg/dL — ABNORMAL LOW (ref 8–23)
CO2: 29 mmol/L (ref 22–32)
Calcium: 8.8 mg/dL — ABNORMAL LOW (ref 8.9–10.3)
Chloride: 97 mmol/L — ABNORMAL LOW (ref 98–111)
Creatinine, Ser: 0.75 mg/dL (ref 0.61–1.24)
GFR, Estimated: >60 mL/min (ref >60)
Glucose, Bld: 164 mg/dL — ABNORMAL HIGH (ref 70–99)
Potassium: 4 mmol/L (ref 3.5–5.1)
Sodium: 136 mmol/L (ref 135–145)

## 2020-08-26 LAB — GLUCOSE, CAPILLARY
Glucose-Capillary: 180 mg/dL — ABNORMAL HIGH (ref 70–99)
Glucose-Capillary: 205 mg/dL — ABNORMAL HIGH (ref 70–99)

## 2020-08-26 LAB — PROTIME-INR
INR: 1 (ref 0.8–1.2)
Prothrombin Time: 13 seconds (ref 11.4–15.2)

## 2020-08-26 SURGERY — VIDEO BRONCHOSCOPY WITHOUT FLUORO
Anesthesia: General

## 2020-08-26 MED ORDER — CELECOXIB 200 MG PO CAPS
200.0000 mg | ORAL_CAPSULE | Freq: Two times a day (BID) | ORAL | Status: DC
Start: 2020-08-26 — End: 2020-09-23

## 2020-08-26 MED ORDER — PHENYLEPHRINE HCL-NACL 10-0.9 MG/250ML-% IV SOLN
INTRAVENOUS | Status: DC | PRN
  Administered 2020-08-26: 50 ug/min via INTRAVENOUS

## 2020-08-26 MED ORDER — PHENYLEPHRINE 40 MCG/ML (10ML) SYRINGE FOR IV PUSH (FOR BLOOD PRESSURE SUPPORT)
PREFILLED_SYRINGE | INTRAVENOUS | Status: DC | PRN
  Administered 2020-08-26: 40 ug via INTRAVENOUS
  Administered 2020-08-26 (×2): 80 ug via INTRAVENOUS

## 2020-08-26 MED ORDER — LIDOCAINE 2% (20 MG/ML) 5 ML SYRINGE
INTRAMUSCULAR | Status: DC | PRN
  Administered 2020-08-26: 40 mg via INTRAVENOUS

## 2020-08-26 MED ORDER — FENTANYL CITRATE (PF) 100 MCG/2ML IJ SOLN: 25.0000 ug | INTRAMUSCULAR | Status: DC | PRN

## 2020-08-26 MED ORDER — ONDANSETRON HCL 4 MG/2ML IJ SOLN
INTRAMUSCULAR | Status: DC | PRN
  Administered 2020-08-26: 4 mg via INTRAVENOUS

## 2020-08-26 MED ORDER — CHLORHEXIDINE GLUCONATE 0.12 % MT SOLN: 15.0000 mL | Freq: Once | OROMUCOSAL | Status: AC

## 2020-08-26 MED ORDER — CHLORHEXIDINE GLUCONATE 0.12 % MT SOLN
OROMUCOSAL | Status: AC
  Administered 2020-08-26: 15 mL via OROMUCOSAL
  Filled 2020-08-26: qty 15

## 2020-08-26 MED ORDER — ROCURONIUM BROMIDE 10 MG/ML (PF) SYRINGE
PREFILLED_SYRINGE | INTRAVENOUS | Status: DC | PRN
  Administered 2020-08-26: 60 mg via INTRAVENOUS

## 2020-08-26 MED ORDER — LACTATED RINGERS IV SOLN: INTRAVENOUS | Status: DC

## 2020-08-26 MED ORDER — INSULIN ASPART 100 UNIT/ML ~~LOC~~ SOLN
SUBCUTANEOUS | Status: AC
  Filled 2020-08-26: qty 1

## 2020-08-26 MED ORDER — OXYCODONE HCL 5 MG/5ML PO SOLN: 5.0000 mg | Freq: Once | ORAL | Status: DC | PRN

## 2020-08-26 MED ORDER — SUGAMMADEX SODIUM 200 MG/2ML IV SOLN
INTRAVENOUS | Status: DC | PRN
  Administered 2020-08-26 (×2): 200 mg via INTRAVENOUS

## 2020-08-26 MED ORDER — ONDANSETRON HCL 4 MG/2ML IJ SOLN: 4.0000 mg | Freq: Once | INTRAMUSCULAR | Status: DC | PRN

## 2020-08-26 MED ORDER — LIDOCAINE HCL (PF) 1 % IJ SOLN
INTRAMUSCULAR | Status: AC
  Filled 2020-08-26: qty 30

## 2020-08-26 MED ORDER — OXYCODONE HCL 5 MG PO TABS: 5.0000 mg | ORAL_TABLET | Freq: Once | ORAL | Status: DC | PRN

## 2020-08-26 MED ORDER — PROPOFOL 10 MG/ML IV BOLUS
INTRAVENOUS | Status: DC | PRN
  Administered 2020-08-26: 170 mg via INTRAVENOUS

## 2020-08-26 MED ORDER — INSULIN ASPART 100 UNIT/ML ~~LOC~~ SOLN
5.0000 [IU] | Freq: Once | SUBCUTANEOUS | Status: AC
  Administered 2020-08-26: 5 [IU] via SUBCUTANEOUS
  Filled 2020-08-26: qty 0.05

## 2020-08-26 NOTE — Telephone Encounter (Signed)
I wrote those orders based on the medication intake - it led me to believe that his escitalopram had been changed to an alternative. Similarly, I continued the celebrex because it was on the med list already.   To simplifiy, this is what he really needs to do >> continue all of his medications as he was already taking them. No changes. The alterations that I made were my mistake, confusion about the med list that was in his chart.. Thanks.

## 2020-08-26 NOTE — Addendum Note (Signed)
Addendum  created 08/26/20 1746 by Roberts Gaudy, MD   Clinical Note Signed

## 2020-08-26 NOTE — Interval H&P Note (Signed)
History and Physical Interval Note:  08/26/2020 7:07 AM  Todd [name]  has presented today for surgery, with the diagnosis of endobronchial lesion.  The various methods of treatment have been discussed with the patient and family. After consideration of risks, benefits and other options for treatment, the patient has consented to  Procedure(s) with comments: VIDEO BRONCHOSCOPY WITHOUT FLUORO (N/A) - with LMA as a surgical intervention.  The patient's history has been reviewed, patient examined, no change in status, stable for surgery.  I have reviewed the patient's chart and labs.  Questions were answered to the patient's satisfaction.     Collene Gobble

## 2020-08-26 NOTE — Anesthesia Procedure Notes (Signed)
Procedure Name: Intubation Date/Time: 08/26/2020 7:38 AM Performed by: Valda Favia, CRNA Pre-anesthesia Checklist: Patient identified, Emergency Drugs available, Suction available and Patient being monitored Patient Re-evaluated:Patient Re-evaluated prior to induction Oxygen Delivery Method: Circle System Utilized Preoxygenation: Pre-oxygenation with 100% oxygen Induction Type: IV induction Ventilation: Mask ventilation without difficulty and Oral airway inserted - appropriate to patient size Laryngoscope Size: Mac and 4 Grade View: Grade II Tube type: Oral Tube size: 8.5 mm Number of attempts: 1 Airway Equipment and Method: Stylet and Oral airway Placement Confirmation: ETT inserted through vocal cords under direct vision,  positive ETCO2 and breath sounds checked- equal and bilateral Secured at: 21 cm Tube secured with: Tape Dental Injury: Teeth and Oropharynx as per pre-operative assessment

## 2020-08-26 NOTE — Op Note (Signed)
[facility] Cardiopulmonary Patient Name: Todd [name] Date: 08/26/2020 MRN: [id number] Attending MD: Collene Gobble , MD Date of Birth: 23-Sep-1961 CSN: Finalized Age: 59 Admit Type: Outpatient Gender: Male Procedure:             Bronchoscopy Indications:           Suspicious right middle lobe lesion, Atelectasis of                         the right middle lobe, Collapsed lung of the right                         middle lobe Providers:             Collene Gobble, MD, Vista Lawman, RN, Darlene H.                         Rosana Hoes, Technician, Edmonia James, CRNA Referring MD:           Medicines:              Complications:         No immediate complications Estimated Blood Loss:  Estimated blood loss was minimal. Procedure:             Pre-Anesthesia Assessment:                        - A History and Physical has been performed. Patient                         meds and allergies have been reviewed. The risks and                         benefits of the procedure and the sedation options and                         risks were discussed with the patient. All questions                         were answered and informed consent was obtained.                         Patient identification and proposed procedure were                         verified prior to the procedure by the physician in                         the pre-procedure area. Mental Status Examination:                         alert and oriented. Airway Examination: normal                         oropharyngeal airway. Respiratory Examination: poor                         air movement. CV Examination: normal. ASA Grade  Assessment: II - A patient with mild systemic disease.                         After reviewing the risks and benefits, the patient                         was deemed in satisfactory condition to undergo the                         procedure. The anesthesia plan was to  use general                         anesthesia. Immediately prior to administration of                         medications, the patient was re-assessed for adequacy                         to receive sedatives. The heart rate, respiratory                         rate, oxygen saturations, blood pressure, adequacy of                         pulmonary ventilation, and response to care were                         monitored throughout the procedure. The physical                         status of the patient was re-assessed after the                         procedure.                        After obtaining informed consent, the bronchoscope was                         passed under direct vision. Throughout the procedure,                         the patient's blood pressure, pulse, and oxygen                         saturations were monitored continuously. the [id number]                         ([id number]) Olympus Therapeutic Bronchoscope was                         introduced through the mouth, via the endotracheal                         tube (the patient was intubated for the procedure) and                         advanced to the tracheobronchial tree. The procedure  was accomplished without difficulty. The patient                         tolerated the procedure well. Scope In: Scope Out: Findings:      Bilateral Lung Abnormalities: Smooth raised mucosal masses were noted       throughout the airways tree, including in the proximal trachea, all       right sided airways especially the RUL, BI, RLL. The RML airway could       not be clearly visualized, was obstructed by one of the raised lesions.       There were fewer but similar raised masses on the left side as well.       Most prominent non-obstructing mass was found in the bronchus       intermedius. The mass was endobronchial, polypoid, raised and smooth.       The lesion was successfully traversed.       Endobronchial biopsies of a mass were performed in the bronchus       intermedius using a forceps and sent for histopathology examination.       Five samples were obtained.      Transbronchial needle aspirations of a mass were performed in the       bronchus intermedius using a Wang 19 gauge needle and sent for routine       cytology. Five samples were obtained.      Brushings of a mass were obtained in the bronchus intermedius with a       cytology brush and sent for routine cytology. Four samples were obtained.      Bronchoalveolar lavage was performed in the RLL medial basal segment       (B7) of the lung and sent for routine cytology. 60 mL of fluid were       instilled. 50 mL were returned. The return was bloody. There were no       mucoid plugs in the return fluid. Impression:            - Suspicious right middle lobe lesion                        - Atelectasis of the right middle lobe                        - Collapsed lung of the right middle lobe                        - Multiple endobronchial, polypoid, raised and smooth                         masses were found in the bronchus intermedius.                        - Endobronchial, raised and smooth masses were found                         throughout the tracheobronchial tree.                        - An endobronchial biopsy was performed >> bronchus  intermedius.                        - A transbronchial needle aspiration was performed >>                         bronchus intermedius.                        - Brushings were obtained >> bronchus intermedius.                        - Bronchoalveolar lavage was performed BI and RLL. Moderate Sedation:      Performed under general anesthesia Recommendation:        - Await BAL, biopsy and brushing results. Procedure Code(s):     --- Professional ---                        [phone], Bronchoscopy, rigid or flexible, including                         fluoroscopic  guidance, when performed; with                         transbronchial needle aspiration biopsy(s), trachea,                         main stem and/or lobar bronchus(i)                        96759, 59, Bronchoscopy, rigid or flexible, including                         fluoroscopic guidance, when performed; with bronchial                         or endobronchial biopsy(s), single or multiple sites                        16384, Bronchoscopy, rigid or flexible, including                         fluoroscopic guidance, when performed; with bronchial                         alveolar lavage                        31623, Bronchoscopy, rigid or flexible, including                         fluoroscopic guidance, when performed; with brushing                         or protected brushings Diagnosis Code(s):     --- Professional ---                        R91.8, Other nonspecific abnormal finding of lung field                        J98.11, Atelectasis  J98.19, Other pulmonary collapse                        J98.9, Respiratory disorder, unspecified CPT copyright 2019 American Medical Association. All rights reserved. The codes documented in this report are preliminary and upon coder review may  be revised to meet current compliance requirements. Collene Gobble, MD Collene Gobble, MD 08/26/2020 8:39:41 AM Number of Addenda: 0

## 2020-08-26 NOTE — Transfer of Care (Signed)
Immediate Anesthesia Transfer of Care Note  Patient: Todd [name]  Procedure(s) Performed: VIDEO BRONCHOSCOPY WITHOUT FLUORO (N/A ) BRONCHIAL BIOPSIES BRONCHIAL BRUSHINGS BRONCHIAL NEEDLE ASPIRATION BIOPSIES BRONCHIAL WASHINGS  Patient Location: PACU  Anesthesia Type:General  Level of Consciousness: lethargic and responds to stimulation  Airway & Oxygen Therapy: Patient Spontanous Breathing and Patient placed on BiPAP by RT  Post-op Assessment: Report given to RN, Post -op Vital signs reviewed and stable and Patient moving all extremities X 4  Post vital signs: Reviewed and stable  Last Vitals:  Vitals Value Taken Time  BP 139/88 08/26/20 0935  Temp    Pulse 73 08/26/20 0942  Resp 21 08/26/20 0942  SpO2 98 % 08/26/20 0942  Vitals shown include unvalidated device data.  Last Pain:  Vitals:   08/26/20 0626  TempSrc:   PainSc: 0-No pain      Patients Stated Pain Goal: 4 (67/12/45 8099)  Complications: No complications documented.

## 2020-08-26 NOTE — Telephone Encounter (Signed)
Spoke with pt's wife (dpr on file), states that pt was told after bronch today to discontinue escitalopram and to start taking celebrex.  Wife is wondering if this is a temporary change or a permanent change.    Additionally, rx for eclebrex was put in chart today but it was not sent to pharmacy- it was listed as "no print".  RB please advise, thanks!

## 2020-08-26 NOTE — Discharge Instructions (Signed)
Flexible Bronchoscopy, Care After This sheet gives you information about how to care for yourself after your test. Your doctor may also give you more specific instructions. If you have problems or questions, contact your doctor. Follow these instructions at home: Eating and drinking  Do not eat or drink anything (not even water) for 2 hours after your test, or until your numbing medicine (local anesthetic) wears off.  When your numbness is gone and your cough and gag reflexes have come back, you may: ? Eat only soft foods. ? Slowly drink liquids.  The day after the test, go back to your normal diet. Driving  Do not drive for 24 hours if you were given a medicine to help you relax (sedative).  Do not drive or use heavy machinery while taking prescription pain medicine. General instructions   Take over-the-counter and prescription medicines only as told by your doctor.  Return to your normal activities as told. Ask what activities are safe for you.  Do not use any products that have nicotine or tobacco in them. This includes cigarettes and e-cigarettes. If you need help quitting, ask your doctor.  Keep all follow-up visits as told by your doctor. This is important. It is very important if you had a tissue sample (biopsy) taken. Get help right away if:  You have shortness of breath that gets worse.  You get light-headed.  You feel like you are going to pass out (faint).  You have chest pain.  You cough up: ? More than a little blood. ? More blood than before. Summary  Do not eat or drink anything (not even water) for 2 hours after your test, or until your numbing medicine wears off.  Do not use cigarettes. Do not use e-cigarettes.  Get help right away if you have chest pain.  Please call our office for any questions or concerns. [phone].   This information is not intended to replace advice given to you by your health care provider. Make sure you discuss any  questions you have with your health care provider. Document Revised: 10/05/2017 Document Reviewed: 11/10/2016 Elsevier Patient Education  2020 [employer].

## 2020-08-26 NOTE — Progress Notes (Signed)
Anesthesiology note:  Mr. Todd [name] is a 59 year old male who underwent bronchoscopy for a right middle lobe mass with partial right middle lobe collapse by Dr. Lamonte Sakai.  The procedure was done under general endotracheal anesthesia.  Following emergence from anesthesia and extubation, the patient developed airway obstruction with hypertension, diaphoresis, and venous congestion of his face.  He was treated with positive pressure mask ventilation and nasal airway.  He was slow to arouse and to follow commands and was observed in the bronchoscopy room for approximately 15 to 20 minutes before transferring to the recovery room.  He was then placed on BiPAP and had good tidal volumes with progressive improvement in his mental status and oxygen saturation.  He was subsequently weaned from the BiPAP and the nasal airway was removed. After approximately 1-1/2 hours in the recovery room, he was awake and alert and oxygenating well on room air.  The post procedure chest x-ray showed a right upper lobe mass with bilateral volume loss and but no other airspace disease noted.  The patient was then discharged home.  Roberts Gaudy

## 2020-08-26 NOTE — Telephone Encounter (Signed)
Spoke with wife, aware of recs.  Nothing further needed at this time- will close encounter.

## 2020-08-26 NOTE — Anesthesia Postprocedure Evaluation (Signed)
Anesthesia Post Note  Patient: Todd [name]  Procedure(s) Performed: VIDEO BRONCHOSCOPY WITHOUT FLUORO (N/A ) BRONCHIAL BIOPSIES BRONCHIAL BRUSHINGS BRONCHIAL NEEDLE ASPIRATION BIOPSIES BRONCHIAL WASHINGS     Patient location during evaluation: PACU Anesthesia Type: General Level of consciousness: awake and alert Pain management: pain level controlled Vital Signs Assessment: post-procedure vital signs reviewed and stable Respiratory status: spontaneous breathing, nonlabored ventilation, respiratory function stable and patient connected to nasal cannula oxygen Cardiovascular status: blood pressure returned to baseline and stable Postop Assessment: no apparent nausea or vomiting Anesthetic complications: no   No complications documented.  Last Vitals:  Vitals:   08/26/20 1115 08/26/20 1130  BP: 112/79 115/71  Pulse: 67 70  Resp: 14 18  Temp:  (!) 36.4 C  SpO2: 100% 95%    Last Pain:  Vitals:   08/26/20 1130  TempSrc:   PainSc: 0-No pain                 Breiona Couvillon COKER

## 2020-08-26 NOTE — Progress Notes (Signed)
Called to PACU for BiPAP on post bronchoscopy.  Placed on V60 14/8, 100% FiO2.  Patient tolerating settings well.  No complications noted.

## 2020-08-27 LAB — CYTOLOGY - NON PAP

## 2020-08-27 LAB — SURGICAL PATHOLOGY

## 2020-08-27 NOTE — Telephone Encounter (Signed)
Per our records, patient has bronch on 08/26/2020. Will close encounter.

## 2020-08-30 ENCOUNTER — Telehealth: Payer: Self-pay | Admitting: Pulmonary Disease

## 2020-08-30 NOTE — Telephone Encounter (Signed)
PCCM:  I called and relayed the patients pathology results.  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   I told him to keep his appt with RB on 11/1  Garner Nash, DO [facility] 08/30/2020 12:10 PM

## 2020-09-01 ENCOUNTER — Other Ambulatory Visit: Payer: Self-pay | Admitting: Family Medicine

## 2020-09-06 ENCOUNTER — Other Ambulatory Visit: Payer: Self-pay

## 2020-09-06 ENCOUNTER — Ambulatory Visit (INDEPENDENT_AMBULATORY_CARE_PROVIDER_SITE_OTHER): Payer: Medicare Other | Admitting: Emergency Medicine

## 2020-09-06 ENCOUNTER — Encounter: Payer: Self-pay | Admitting: Emergency Medicine

## 2020-09-06 DIAGNOSIS — Z720 Tobacco use: Secondary | ICD-10-CM | POA: Diagnosis not present

## 2020-09-06 DIAGNOSIS — J449 Chronic obstructive pulmonary disease, unspecified: Secondary | ICD-10-CM

## 2020-09-06 DIAGNOSIS — J9811 Atelectasis: Secondary | ICD-10-CM | POA: Diagnosis not present

## 2020-09-06 MED ORDER — TRELEGY ELLIPTA 100-62.5-25 MCG/INH IN AEPB: 1.0000 | INHALATION_SPRAY | Freq: Every day | RESPIRATORY_TRACT | 0 refills | Status: DC

## 2020-09-06 NOTE — Progress Notes (Signed)
Subjective:    Patient ID: Todd [name], male    DOB: 1961-01-02, 59 y.o.   MRN: [id number]  HPI 59 year old active smoker (40 pack years) with a history of diabetes, CAD/MI (PTCI to LAD, medical management), hyperlipidemia, hypertension.  He is referred today for an abnormal CT scan of the chest.  He underwent a chest x-ray 07/25/2020 for cough that showed persistent right middle lobe consolidation.  A CT chest done 08/01/2020 was reviewed by me, shows some emphysematous change bilaterally, an area of large patchy consolidation, rounded, in the right middle lobe with focal narrowing of the origin of his right middle lobe bronchus and mild nodularity of his right mainstem bronchus extending distally.  There is also a 4 mm right middle lobe nodule.  ROV 09/06/20 --[name] has a history of tobacco use, diabetes, coronary disease, hyperlipidemia, hypertension.  He is here today to follow-up his right middle lobe consolidation and his bronchoscopy that was done 08/26/2020.  There were raised smooth lesions throughout the bronchial tree especially on the right.  There was extrinsic compression of the right middle lobe airway and it could not be entered.  The largest lesion was in the distal bronchus intermedius and this was biopsied by forceps, needle, brushed.  The forceps biopsies showed evidence for benign cartilage and bronchial wall material, needle biopsy showed fragments of benign cartilage and inflammation with scant cellularity.  Brushings were negative.  He has been on Trelegy before, may have benefited some. The cost was high - $48, but he would be willing to retry. Has albuterol, uses about 3x a day. He does still have spells of coughing. Smoking about 2-3 cig a day.    Review of Systems As per HPI      Objective:   Physical Exam  Vitals:   09/06/20 1121  BP: 140/70  Pulse: 75  Temp: 97.6 F (36.4 C)  TempSrc: Oral  SpO2: 94%  Weight: 213 lb (96.6 kg)  Height: 5\' 7"  (1.702 m)    Gen: Pleasant, elderly man, in no distress,  normal affect  ENT: No lesions,  mouth clear,  oropharynx clear, no postnasal drip  Neck: No JVD, no stridor  Lungs: No use of accessory muscles, distant no crackles or wheezing on normal respiration, no wheeze on forced expiration  Cardiovascular: RRR, heart sounds normal, no murmur or gallops, no peripheral edema  Musculoskeletal: No deformities, no cyanosis or clubbing  Neuro: alert, awake, non focal  Skin: Warm, no lesions or rash     Assessment & Plan:  Collapse of right lung Right lung collapse with scattered multiple endobronchial lesions on inspection bronchoscopy, proximal occlusion of his right middle lobe airway.  Pathology shows benign tissue, possibly with cartilage, question hamartoma's.  I do feel like I sampled the lesions adequately.  I think he likely needs a rigid bronchoscopy and thoracic surgery evaluation for possible debridement, confirmation of tissue diagnosis.  Discussed this with him today.  His insurance is about to change and he wants to get this done, check on financial impact of having another procedure.  In the meantime I will review his study, films, endobronchial photos with thoracic surgery in preparation for possible rigid bronchoscopy.  COPD (chronic obstructive pulmonary disease) ([place]) Presumed based on his tobacco history, symptoms.  He has probably gotten some improvement on Trelegy before, we will try to restart.  Cost has been an issue, sounds like he can get it for $48 monthly. We will try to restart, see if  he benefits. Also perform walking oximetry today to eval for home o2 need.   Tobacco abuse He has cut down w help of family. Encouraged him to work towards stopping completely.   Todd Apo, MD, PhD 09/06/2020, 12:31 PM [facility] [phone] or if no answer [phone]

## 2020-09-06 NOTE — Patient Instructions (Signed)
Dr Lamonte Sakai will review your bronchoscopy results with Thoracic Surgery to get recommendations about next steps to evaluate and treat your large airway obstruction. You may benefit from repeat bronchoscopy and removal of tumor tissue.  Walking oximetry today on room air Work hard on stopping your smoking.  We will retry Trelegy once inhalation once a day. Rinse and gargle after using.  Keep albuterol available to use 2 puffs up to every 4 hours if needed for shortness of breath, chest tightness, wheezing.  Follow with Dr Lamonte Sakai in 1 month or next available.

## 2020-09-06 NOTE — Assessment & Plan Note (Signed)
Right lung collapse with scattered multiple endobronchial lesions on inspection bronchoscopy, proximal occlusion of his right middle lobe airway.  Pathology shows benign tissue, possibly with cartilage, question hamartoma's.  I do feel like I sampled the lesions adequately.  I think he likely needs a rigid bronchoscopy and thoracic surgery evaluation for possible debridement, confirmation of tissue diagnosis.  Discussed this with him today.  His insurance is about to change and he wants to get this done, check on financial impact of having another procedure.  In the meantime I will review his study, films, endobronchial photos with thoracic surgery in preparation for possible rigid bronchoscopy.

## 2020-09-06 NOTE — Assessment & Plan Note (Signed)
Presumed based on his tobacco history, symptoms.  He has probably gotten some improvement on Trelegy before, we will try to restart.  Cost has been an issue, sounds like he can get it for $48 monthly. We will try to restart, see if he benefits. Also perform walking oximetry today to eval for home o2 need.

## 2020-09-06 NOTE — Assessment & Plan Note (Signed)
He has cut down w help of family. Encouraged him to work towards stopping completely.

## 2020-09-17 ENCOUNTER — Other Ambulatory Visit: Payer: Self-pay | Admitting: *Deleted

## 2020-09-17 ENCOUNTER — Other Ambulatory Visit: Payer: Self-pay

## 2020-09-17 DIAGNOSIS — K219 Gastro-esophageal reflux disease without esophagitis: Secondary | ICD-10-CM

## 2020-09-17 MED ORDER — PANTOPRAZOLE SODIUM 40 MG PO TBEC: 40.0000 mg | DELAYED_RELEASE_TABLET | Freq: Every day | ORAL | 3 refills | Status: DC

## 2020-09-20 ENCOUNTER — Emergency Department (HOSPITAL_COMMUNITY): Payer: Medicare Other

## 2020-09-20 ENCOUNTER — Telehealth: Payer: Self-pay

## 2020-09-20 ENCOUNTER — Encounter (HOSPITAL_COMMUNITY): Payer: Self-pay | Admitting: Emergency Medicine

## 2020-09-20 ENCOUNTER — Other Ambulatory Visit: Payer: Self-pay

## 2020-09-20 ENCOUNTER — Ambulatory Visit (INDEPENDENT_AMBULATORY_CARE_PROVIDER_SITE_OTHER): Payer: Medicare Other | Admitting: Family Medicine

## 2020-09-20 ENCOUNTER — Inpatient Hospital Stay (HOSPITAL_COMMUNITY)
Admission: EM | Admit: 2020-09-20 | Discharge: 2020-09-23 | DRG: 242 | Disposition: A | Discharge status: Home Health | Payer: Medicare Other | Source: Ambulatory Visit | Attending: Internal Medicine | Admitting: Internal Medicine

## 2020-09-20 VITALS — BP 150/90 | HR 62 | Temp 97.2°F | Ht 67.0 in | Wt 211.0 lb

## 2020-09-20 DIAGNOSIS — Z6832 Body mass index (BMI) 32.0-32.9, adult: Secondary | ICD-10-CM

## 2020-09-20 DIAGNOSIS — R55 Syncope and collapse: Secondary | ICD-10-CM | POA: Diagnosis not present

## 2020-09-20 DIAGNOSIS — I10 Essential (primary) hypertension: Secondary | ICD-10-CM | POA: Diagnosis present

## 2020-09-20 DIAGNOSIS — I152 Hypertension secondary to endocrine disorders: Secondary | ICD-10-CM | POA: Diagnosis not present

## 2020-09-20 DIAGNOSIS — K219 Gastro-esophageal reflux disease without esophagitis: Secondary | ICD-10-CM | POA: Diagnosis present

## 2020-09-20 DIAGNOSIS — R918 Other nonspecific abnormal finding of lung field: Secondary | ICD-10-CM | POA: Diagnosis present

## 2020-09-20 DIAGNOSIS — I2510 Atherosclerotic heart disease of native coronary artery without angina pectoris: Secondary | ICD-10-CM | POA: Diagnosis not present

## 2020-09-20 DIAGNOSIS — Z955 Presence of coronary angioplasty implant and graft: Secondary | ICD-10-CM | POA: Diagnosis not present

## 2020-09-20 DIAGNOSIS — I459 Conduction disorder, unspecified: Secondary | ICD-10-CM | POA: Diagnosis not present

## 2020-09-20 DIAGNOSIS — J449 Chronic obstructive pulmonary disease, unspecified: Secondary | ICD-10-CM | POA: Diagnosis present

## 2020-09-20 DIAGNOSIS — E669 Obesity, unspecified: Secondary | ICD-10-CM | POA: Diagnosis present

## 2020-09-20 DIAGNOSIS — I2582 Chronic total occlusion of coronary artery: Secondary | ICD-10-CM | POA: Diagnosis present

## 2020-09-20 DIAGNOSIS — H748X1 Other specified disorders of right middle ear and mastoid: Secondary | ICD-10-CM | POA: Diagnosis not present

## 2020-09-20 DIAGNOSIS — D1430 Benign neoplasm of unspecified bronchus and lung: Secondary | ICD-10-CM | POA: Diagnosis present

## 2020-09-20 DIAGNOSIS — R269 Unspecified abnormalities of gait and mobility: Secondary | ICD-10-CM | POA: Diagnosis not present

## 2020-09-20 DIAGNOSIS — Z8701 Personal history of pneumonia (recurrent): Secondary | ICD-10-CM

## 2020-09-20 DIAGNOSIS — Z20822 Contact with and (suspected) exposure to covid-19: Secondary | ICD-10-CM | POA: Diagnosis present

## 2020-09-20 DIAGNOSIS — E119 Type 2 diabetes mellitus without complications: Secondary | ICD-10-CM

## 2020-09-20 DIAGNOSIS — E785 Hyperlipidemia, unspecified: Secondary | ICD-10-CM | POA: Diagnosis present

## 2020-09-20 DIAGNOSIS — Z79899 Other long term (current) drug therapy: Secondary | ICD-10-CM

## 2020-09-20 DIAGNOSIS — F17290 Nicotine dependence, other tobacco product, uncomplicated: Secondary | ICD-10-CM | POA: Diagnosis not present

## 2020-09-20 DIAGNOSIS — E1169 Type 2 diabetes mellitus with other specified complication: Secondary | ICD-10-CM | POA: Diagnosis not present

## 2020-09-20 DIAGNOSIS — M25562 Pain in left knee: Secondary | ICD-10-CM | POA: Diagnosis not present

## 2020-09-20 DIAGNOSIS — J9811 Atelectasis: Secondary | ICD-10-CM | POA: Diagnosis not present

## 2020-09-20 DIAGNOSIS — Z91040 Latex allergy status: Secondary | ICD-10-CM

## 2020-09-20 DIAGNOSIS — J341 Cyst and mucocele of nose and nasal sinus: Secondary | ICD-10-CM | POA: Diagnosis not present

## 2020-09-20 DIAGNOSIS — I455 Other specified heart block: Principal | ICD-10-CM | POA: Diagnosis present

## 2020-09-20 DIAGNOSIS — R001 Bradycardia, unspecified: Secondary | ICD-10-CM | POA: Diagnosis not present

## 2020-09-20 DIAGNOSIS — I495 Sick sinus syndrome: Secondary | ICD-10-CM | POA: Diagnosis not present

## 2020-09-20 DIAGNOSIS — I252 Old myocardial infarction: Secondary | ICD-10-CM

## 2020-09-20 DIAGNOSIS — F17210 Nicotine dependence, cigarettes, uncomplicated: Secondary | ICD-10-CM | POA: Diagnosis present

## 2020-09-20 DIAGNOSIS — Z880 Allergy status to penicillin: Secondary | ICD-10-CM

## 2020-09-20 DIAGNOSIS — Z8249 Family history of ischemic heart disease and other diseases of the circulatory system: Secondary | ICD-10-CM

## 2020-09-20 DIAGNOSIS — G8929 Other chronic pain: Secondary | ICD-10-CM | POA: Diagnosis present

## 2020-09-20 DIAGNOSIS — Z7984 Long term (current) use of oral hypoglycemic drugs: Secondary | ICD-10-CM | POA: Diagnosis not present

## 2020-09-20 DIAGNOSIS — J9601 Acute respiratory failure with hypoxia: Secondary | ICD-10-CM | POA: Diagnosis not present

## 2020-09-20 DIAGNOSIS — J3489 Other specified disorders of nose and nasal sinuses: Secondary | ICD-10-CM | POA: Diagnosis not present

## 2020-09-20 DIAGNOSIS — Z95818 Presence of other cardiac implants and grafts: Secondary | ICD-10-CM

## 2020-09-20 DIAGNOSIS — E1159 Type 2 diabetes mellitus with other circulatory complications: Secondary | ICD-10-CM | POA: Diagnosis present

## 2020-09-20 DIAGNOSIS — M1990 Unspecified osteoarthritis, unspecified site: Secondary | ICD-10-CM | POA: Diagnosis present

## 2020-09-20 DIAGNOSIS — Z85828 Personal history of other malignant neoplasm of skin: Secondary | ICD-10-CM

## 2020-09-20 DIAGNOSIS — J90 Pleural effusion, not elsewhere classified: Secondary | ICD-10-CM | POA: Diagnosis not present

## 2020-09-20 DIAGNOSIS — I2699 Other pulmonary embolism without acute cor pulmonale: Secondary | ICD-10-CM | POA: Diagnosis not present

## 2020-09-20 DIAGNOSIS — F32A Depression, unspecified: Secondary | ICD-10-CM | POA: Diagnosis not present

## 2020-09-20 DIAGNOSIS — Z886 Allergy status to analgesic agent status: Secondary | ICD-10-CM

## 2020-09-20 DIAGNOSIS — R079 Chest pain, unspecified: Secondary | ICD-10-CM | POA: Diagnosis not present

## 2020-09-20 DIAGNOSIS — Z96651 Presence of right artificial knee joint: Secondary | ICD-10-CM | POA: Diagnosis present

## 2020-09-20 LAB — CBC WITH DIFFERENTIAL/PLATELET
Abs Immature Granulocytes: 0.04 10*3/uL (ref 0.00–0.07)
Basophils Absolute: 0 10*3/uL (ref 0.0–0.1)
Basophils Relative: 1 %
Eosinophils Absolute: 0.2 10*3/uL (ref 0.0–0.5)
Eosinophils Relative: 2 %
HCT: 47.3 % (ref 39.0–52.0)
Hemoglobin: 15 g/dL (ref 13.0–17.0)
Immature Granulocytes: 1 %
Lymphocytes Relative: 19 %
Lymphs Abs: 1.6 10*3/uL (ref 0.7–4.0)
MCH: 29.9 pg (ref 26.0–34.0)
MCHC: 31.7 g/dL (ref 30.0–36.0)
MCV: 94.4 fL (ref 80.0–100.0)
Monocytes Absolute: 0.6 10*3/uL (ref 0.1–1.0)
Monocytes Relative: 7 %
Neutro Abs: 6.1 10*3/uL (ref 1.7–7.7)
Neutrophils Relative %: 70 %
Platelets: 193 10*3/uL (ref 150–400)
RBC: 5.01 MIL/uL (ref 4.22–5.81)
RDW: 12.9 % (ref 11.5–15.5)
WBC: 8.5 10*3/uL (ref 4.0–10.5)
nRBC: 0 % (ref 0.0–0.2)

## 2020-09-20 LAB — BASIC METABOLIC PANEL
Anion gap: 10 (ref 5–15)
BUN: 6 mg/dL — ABNORMAL LOW (ref 8–23)
CO2: 32 mmol/L (ref 22–32)
Calcium: 8.9 mg/dL (ref 8.9–10.3)
Chloride: 92 mmol/L — ABNORMAL LOW (ref 98–111)
Creatinine, Ser: 0.69 mg/dL (ref 0.61–1.24)
GFR, Estimated: >60 mL/min (ref >60)
Glucose, Bld: 260 mg/dL — ABNORMAL HIGH (ref 70–99)
Potassium: 3.7 mmol/L (ref 3.5–5.1)
Sodium: 134 mmol/L — ABNORMAL LOW (ref 135–145)

## 2020-09-20 LAB — CBC
HCT: 46.9 % (ref 39.0–52.0)
Hemoglobin: 15.2 g/dL (ref 13.0–17.0)
MCH: 30.6 pg (ref 26.0–34.0)
MCHC: 32.4 g/dL (ref 30.0–36.0)
MCV: 94.6 fL (ref 80.0–100.0)
Platelets: 198 10*3/uL (ref 150–400)
RBC: 4.96 MIL/uL (ref 4.22–5.81)
RDW: 13.1 % (ref 11.5–15.5)
WBC: 8.4 10*3/uL (ref 4.0–10.5)
nRBC: 0 % (ref 0.0–0.2)

## 2020-09-20 LAB — TROPONIN I (HIGH SENSITIVITY)
Troponin I (High Sensitivity): 13 ng/L (ref <18)
Troponin I (High Sensitivity): 12 ng/L (ref <18)

## 2020-09-20 LAB — HEPATIC FUNCTION PANEL
ALT: 31 U/L (ref 0–44)
AST: 27 U/L (ref 15–41)
Albumin: 3.9 g/dL (ref 3.5–5.0)
Alkaline Phosphatase: 75 U/L (ref 38–126)
Bilirubin, Direct: <0.1 mg/dL (ref 0.0–0.2)
Total Bilirubin: 0.6 mg/dL (ref 0.3–1.2)
Total Protein: 6.9 g/dL (ref 6.5–8.1)

## 2020-09-20 LAB — RESPIRATORY PANEL BY RT PCR (FLU A&B, COVID)
Influenza A by PCR: NEGATIVE
Influenza B by PCR: NEGATIVE
SARS Coronavirus 2 by RT PCR: NEGATIVE

## 2020-09-20 LAB — LACTIC ACID, PLASMA
Lactic Acid, Venous: 2.8 mmol/L (ref 0.5–1.9)
Lactic Acid, Venous: 1.4 mmol/L (ref 0.5–1.9)

## 2020-09-20 LAB — CBG MONITORING, ED: Glucose-Capillary: 238 mg/dL — ABNORMAL HIGH (ref 70–99)

## 2020-09-20 MED ORDER — ONDANSETRON HCL 4 MG/2ML IJ SOLN: 4.0000 mg | Freq: Four times a day (QID) | INTRAMUSCULAR | Status: DC | PRN

## 2020-09-20 MED ORDER — SODIUM CHLORIDE 0.9 % IV BOLUS
1000.0000 mL | Freq: Once | INTRAVENOUS | Status: AC
  Administered 2020-09-20: 1000 mL via INTRAVENOUS

## 2020-09-20 MED ORDER — ACETAMINOPHEN 325 MG PO TABS
650.0000 mg | ORAL_TABLET | Freq: Four times a day (QID) | ORAL | Status: DC | PRN
  Administered 2020-09-22 (×2): 650 mg via ORAL
  Filled 2020-09-20 (×2): qty 2

## 2020-09-20 MED ORDER — ONDANSETRON HCL 4 MG PO TABS: 4.0000 mg | ORAL_TABLET | Freq: Four times a day (QID) | ORAL | Status: DC | PRN

## 2020-09-20 MED ORDER — ASPIRIN EC 81 MG PO TBEC
81.0000 mg | DELAYED_RELEASE_TABLET | Freq: Every day | ORAL | Status: DC
  Administered 2020-09-21 – 2020-09-23 (×3): 81 mg via ORAL
  Filled 2020-09-20 (×3): qty 1

## 2020-09-20 MED ORDER — ACETAMINOPHEN 650 MG RE SUPP: 650.0000 mg | Freq: Four times a day (QID) | RECTAL | Status: DC | PRN

## 2020-09-20 MED ORDER — ENOXAPARIN SODIUM 40 MG/0.4ML ~~LOC~~ SOLN
40.0000 mg | SUBCUTANEOUS | Status: DC
  Administered 2020-09-21: 40 mg via SUBCUTANEOUS
  Filled 2020-09-20: qty 0.4

## 2020-09-20 MED ORDER — FLUTICASONE FUROATE-VILANTEROL 100-25 MCG/INH IN AEPB
1.0000 | INHALATION_SPRAY | Freq: Every morning | RESPIRATORY_TRACT | Status: DC
  Administered 2020-09-22 – 2020-09-23 (×2): 1 via RESPIRATORY_TRACT
  Filled 2020-09-20 (×2): qty 28

## 2020-09-20 MED ORDER — ATORVASTATIN CALCIUM 80 MG PO TABS
80.0000 mg | ORAL_TABLET | Freq: Every day | ORAL | Status: DC
  Administered 2020-09-20 – 2020-09-22 (×3): 80 mg via ORAL
  Filled 2020-09-20: qty 8
  Filled 2020-09-20 (×2): qty 1

## 2020-09-20 MED ORDER — SODIUM CHLORIDE 0.9% FLUSH
3.0000 mL | Freq: Two times a day (BID) | INTRAVENOUS | Status: DC
  Administered 2020-09-20 – 2020-09-23 (×6): 3 mL via INTRAVENOUS

## 2020-09-20 MED ORDER — ALBUTEROL SULFATE HFA 108 (90 BASE) MCG/ACT IN AERS
2.0000 | INHALATION_SPRAY | Freq: Four times a day (QID) | RESPIRATORY_TRACT | Status: DC | PRN
  Administered 2020-09-21: 2 via RESPIRATORY_TRACT
  Filled 2020-09-20: qty 6.7

## 2020-09-20 MED ORDER — INSULIN ASPART 100 UNIT/ML ~~LOC~~ SOLN
0.0000 [IU] | Freq: Every day | SUBCUTANEOUS | Status: DC
  Administered 2020-09-21: 2 [IU] via SUBCUTANEOUS

## 2020-09-20 MED ORDER — UMECLIDINIUM BROMIDE 62.5 MCG/INH IN AEPB
1.0000 | INHALATION_SPRAY | Freq: Every day | RESPIRATORY_TRACT | Status: DC
  Administered 2020-09-22 – 2020-09-23 (×2): 1 via RESPIRATORY_TRACT
  Filled 2020-09-20: qty 7

## 2020-09-20 MED ORDER — INSULIN ASPART 100 UNIT/ML ~~LOC~~ SOLN
0.0000 [IU] | Freq: Three times a day (TID) | SUBCUTANEOUS | Status: DC
  Administered 2020-09-21: 2 [IU] via SUBCUTANEOUS
  Administered 2020-09-21: 3 [IU] via SUBCUTANEOUS
  Administered 2020-09-22 – 2020-09-23 (×4): 2 [IU] via SUBCUTANEOUS
  Administered 2020-09-23: 1 [IU] via SUBCUTANEOUS

## 2020-09-20 MED ORDER — DIAZEPAM 5 MG PO TABS
5.0000 mg | ORAL_TABLET | Freq: Once | ORAL | Status: AC | PRN
  Administered 2020-09-20: 5 mg via ORAL
  Filled 2020-09-20: qty 1

## 2020-09-20 MED ORDER — IOHEXOL 350 MG/ML SOLN
75.0000 mL | Freq: Once | INTRAVENOUS | Status: AC | PRN
  Administered 2020-09-20: 75 mL via INTRAVENOUS
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
## 2020-09-20 NOTE — Telephone Encounter (Signed)
Daughter called crying Todd [name] didn't want to go to E.R he's passed Twice within 25 hours having trouble breathing.

## 2020-09-20 NOTE — ED Triage Notes (Signed)
Pt arrives from pcp, after having syncopal episode while sitting on the side of the bed this morning. Also endorses syncopal episode while grocery shopping last night. States chest pain began after leaving pcp office this a.m. a/ox4, nad.
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
## 2020-09-21 ENCOUNTER — Observation Stay (HOSPITAL_COMMUNITY): Payer: Medicare Other

## 2020-09-21 ENCOUNTER — Ambulatory Visit (HOSPITAL_COMMUNITY)
Admission: EM | Disposition: A | Discharge status: Home Health | Payer: Self-pay | Source: Ambulatory Visit | Attending: Internal Medicine

## 2020-09-21 ENCOUNTER — Encounter (HOSPITAL_COMMUNITY): Payer: Self-pay | Admitting: Cardiology

## 2020-09-21 DIAGNOSIS — I10 Essential (primary) hypertension: Secondary | ICD-10-CM

## 2020-09-21 DIAGNOSIS — E119 Type 2 diabetes mellitus without complications: Secondary | ICD-10-CM

## 2020-09-21 DIAGNOSIS — I2510 Atherosclerotic heart disease of native coronary artery without angina pectoris: Secondary | ICD-10-CM

## 2020-09-21 DIAGNOSIS — R55 Syncope and collapse: Secondary | ICD-10-CM | POA: Diagnosis not present

## 2020-09-21 DIAGNOSIS — J449 Chronic obstructive pulmonary disease, unspecified: Secondary | ICD-10-CM

## 2020-09-21 DIAGNOSIS — K219 Gastro-esophageal reflux disease without esophagitis: Secondary | ICD-10-CM

## 2020-09-21 DIAGNOSIS — I459 Conduction disorder, unspecified: Secondary | ICD-10-CM

## 2020-09-21 HISTORY — PX: LEFT HEART CATH AND CORONARY ANGIOGRAPHY: CATH118249

## 2020-09-21 LAB — BASIC METABOLIC PANEL
Anion gap: 9 (ref 5–15)
BUN: 7 mg/dL — ABNORMAL LOW (ref 8–23)
CO2: 31 mmol/L (ref 22–32)
Calcium: 8.6 mg/dL — ABNORMAL LOW (ref 8.9–10.3)
Chloride: 95 mmol/L — ABNORMAL LOW (ref 98–111)
Creatinine, Ser: 0.73 mg/dL (ref 0.61–1.24)
GFR, Estimated: >60 mL/min (ref >60)
Glucose, Bld: 259 mg/dL — ABNORMAL HIGH (ref 70–99)
Potassium: 3.5 mmol/L (ref 3.5–5.1)
Sodium: 135 mmol/L (ref 135–145)

## 2020-09-21 LAB — CBC
HCT: 42 % (ref 39.0–52.0)
Hemoglobin: 13.5 g/dL (ref 13.0–17.0)
MCH: 30.8 pg (ref 26.0–34.0)
MCHC: 32.1 g/dL (ref 30.0–36.0)
MCV: 95.7 fL (ref 80.0–100.0)
Platelets: 170 10*3/uL (ref 150–400)
RBC: 4.39 MIL/uL (ref 4.22–5.81)
RDW: 13 % (ref 11.5–15.5)
WBC: 7.6 10*3/uL (ref 4.0–10.5)
nRBC: 0 % (ref 0.0–0.2)

## 2020-09-21 LAB — CBG MONITORING, ED
Glucose-Capillary: 217 mg/dL — ABNORMAL HIGH (ref 70–99)
Glucose-Capillary: 161 mg/dL — ABNORMAL HIGH (ref 70–99)
Glucose-Capillary: 244 mg/dL — ABNORMAL HIGH (ref 70–99)
Glucose-Capillary: 206 mg/dL — ABNORMAL HIGH (ref 70–99)

## 2020-09-21 LAB — ECHOCARDIOGRAM COMPLETE
Area-P 1/2: 3.27 cm2
Height: 67 in
S' Lateral: 3.2 cm
Weight: 3376 oz

## 2020-09-21 LAB — GLUCOSE, CAPILLARY
Glucose-Capillary: 179 mg/dL — ABNORMAL HIGH (ref 70–99)
Glucose-Capillary: 141 mg/dL — ABNORMAL HIGH (ref 70–99)
Glucose-Capillary: 194 mg/dL — ABNORMAL HIGH (ref 70–99)

## 2020-09-21 LAB — MAGNESIUM: Magnesium: 1.8 mg/dL (ref 1.7–2.4)

## 2020-09-21 LAB — HEMOGLOBIN A1C
Hgb A1c MFr Bld: 7.9 % — ABNORMAL HIGH (ref 4.8–5.6)
Mean Plasma Glucose: 180.03 mg/dL

## 2020-09-21 SURGERY — LEFT HEART CATH AND CORONARY ANGIOGRAPHY
Anesthesia: LOCAL

## 2020-09-21 MED ORDER — SODIUM CHLORIDE 0.9 % WEIGHT BASED INFUSION: 3.0000 mL/kg/h | INTRAVENOUS | Status: DC

## 2020-09-21 MED ORDER — FENTANYL CITRATE (PF) 100 MCG/2ML IJ SOLN
INTRAMUSCULAR | Status: AC
  Filled 2020-09-21: qty 2

## 2020-09-21 MED ORDER — VERAPAMIL HCL 2.5 MG/ML IV SOLN
INTRAVENOUS | Status: AC
  Filled 2020-09-21: qty 2

## 2020-09-21 MED ORDER — HEPARIN (PORCINE) IN NACL 1000-0.9 UT/500ML-% IV SOLN
INTRAVENOUS | Status: AC
  Filled 2020-09-21: qty 1000

## 2020-09-21 MED ORDER — PANTOPRAZOLE SODIUM 40 MG PO TBEC
40.0000 mg | DELAYED_RELEASE_TABLET | Freq: Every day | ORAL | Status: DC
  Administered 2020-09-21 (×2): 40 mg via ORAL
  Filled 2020-09-21 (×3): qty 1

## 2020-09-21 MED ORDER — MIDAZOLAM HCL 2 MG/2ML IJ SOLN
INTRAMUSCULAR | Status: AC
  Filled 2020-09-21: qty 2

## 2020-09-21 MED ORDER — IOHEXOL 350 MG/ML SOLN
INTRAVENOUS | Status: DC | PRN
  Administered 2020-09-21: 60 mL

## 2020-09-21 MED ORDER — FENTANYL CITRATE (PF) 100 MCG/2ML IJ SOLN
INTRAMUSCULAR | Status: DC | PRN
  Administered 2020-09-21: 25 ug via INTRAVENOUS

## 2020-09-21 MED ORDER — SODIUM CHLORIDE 0.9 % WEIGHT BASED INFUSION: 1.0000 mL/kg/h | INTRAVENOUS | Status: DC

## 2020-09-21 MED ORDER — LIDOCAINE HCL (PF) 1 % IJ SOLN
INTRAMUSCULAR | Status: DC | PRN
  Administered 2020-09-21: 2 mL

## 2020-09-21 MED ORDER — HEPARIN SODIUM (PORCINE) 1000 UNIT/ML IJ SOLN
INTRAMUSCULAR | Status: AC
  Filled 2020-09-21: qty 1

## 2020-09-21 MED ORDER — CHLORHEXIDINE GLUCONATE CLOTH 2 % EX PADS
6.0000 | MEDICATED_PAD | Freq: Every day | CUTANEOUS | Status: DC
  Administered 2020-09-21 – 2020-09-23 (×3): 6 via TOPICAL

## 2020-09-21 MED ORDER — SODIUM CHLORIDE 0.9 % WEIGHT BASED INFUSION
3.0000 mL/kg/h | INTRAVENOUS | Status: DC
  Administered 2020-09-21: 3 mL/kg/h via INTRAVENOUS

## 2020-09-21 MED ORDER — LABETALOL HCL 5 MG/ML IV SOLN: 10.0000 mg | INTRAVENOUS | Status: DC | PRN

## 2020-09-21 MED ORDER — SODIUM CHLORIDE 0.9 % WEIGHT BASED INFUSION: 1.0000 mL/kg/h | INTRAVENOUS | Status: AC

## 2020-09-21 MED ORDER — MIDAZOLAM HCL 2 MG/2ML IJ SOLN
INTRAMUSCULAR | Status: DC | PRN
  Administered 2020-09-21: 2 mg via INTRAVENOUS

## 2020-09-21 MED ORDER — ENOXAPARIN SODIUM 40 MG/0.4ML ~~LOC~~ SOLN: 40.0000 mg | SUBCUTANEOUS | Status: DC

## 2020-09-21 MED ORDER — SODIUM CHLORIDE 0.9% FLUSH
3.0000 mL | Freq: Two times a day (BID) | INTRAVENOUS | Status: DC
  Administered 2020-09-21: 3 mL via INTRAVENOUS

## 2020-09-21 MED ORDER — NICOTINE 21 MG/24HR TD PT24
21.0000 mg | MEDICATED_PATCH | Freq: Every day | TRANSDERMAL | Status: DC
  Administered 2020-09-21 – 2020-09-23 (×3): 21 mg via TRANSDERMAL
  Filled 2020-09-21 (×3): qty 1

## 2020-09-21 MED ORDER — HYDRALAZINE HCL 20 MG/ML IJ SOLN
INTRAMUSCULAR | Status: DC | PRN
  Administered 2020-09-21: 10 mg via INTRAVENOUS

## 2020-09-21 MED ORDER — LIDOCAINE HCL (PF) 1 % IJ SOLN
INTRAMUSCULAR | Status: AC
  Filled 2020-09-21: qty 30

## 2020-09-21 MED ORDER — SODIUM CHLORIDE 0.9% FLUSH: 3.0000 mL | INTRAVENOUS | Status: DC | PRN

## 2020-09-21 MED ORDER — SODIUM CHLORIDE 0.9% FLUSH: 3.0000 mL | Freq: Two times a day (BID) | INTRAVENOUS | Status: DC

## 2020-09-21 MED ORDER — HYDRALAZINE HCL 20 MG/ML IJ SOLN
INTRAMUSCULAR | Status: AC
  Filled 2020-09-21: qty 1

## 2020-09-21 MED ORDER — SODIUM CHLORIDE 0.9 % IV SOLN: 250.0000 mL | INTRAVENOUS | Status: DC | PRN

## 2020-09-21 MED ORDER — HEPARIN (PORCINE) IN NACL 1000-0.9 UT/500ML-% IV SOLN
INTRAVENOUS | Status: DC | PRN
  Administered 2020-09-21 (×2): 500 mL

## 2020-09-21 MED ORDER — HEPARIN SODIUM (PORCINE) 1000 UNIT/ML IJ SOLN
INTRAMUSCULAR | Status: DC | PRN
  Administered 2020-09-21: 5000 [IU] via INTRAVENOUS

## 2020-09-21 SURGICAL SUPPLY — 9 items

## 2020-09-21 NOTE — Progress Notes (Signed)
Called to patient's room by daughter stating patient had passed out again, at same time notified by CCMD that patient was having pauses.  Rosaria Ferries PA contacted, new orders received.

## 2020-09-21 NOTE — Interval H&P Note (Signed)
History and Physical Interval Note:  09/21/2020 2:52 PM  Todd [name]  has presented today for surgery, with the diagnosis of syncope.  The various methods of treatment have been discussed with the patient and family. After consideration of risks, benefits and other options for treatment, the patient has consented to  Procedure(s): LEFT HEART CATH AND CORONARY ANGIOGRAPHY (N/A) as a surgical intervention.  The patient's history has been reviewed, patient examined, no change in status, stable for surgery.  I have reviewed the patient's chart and labs.  Questions were answered to the patient's satisfaction.   Cath Lab Visit (complete for each Cath Lab visit)  Clinical Evaluation Leading to the Procedure:   ACS: No.  Non-ACS:    Anginal Classification: CCS I  Anti-ischemic medical therapy: Minimal Therapy (1 class of medications)  Non-Invasive Test Results: No non-invasive testing performed  Prior CABG: No previous CABG        [name] [place] 09/21/2020 2:52 PM

## 2020-09-21 NOTE — Progress Notes (Addendum)
PROGRESS NOTE    Todd [name]  PIR:[id number] DOB: 1961-09-20 DOA: 09/20/2020 PCP: [name], MD    Chief Complaint  Patient presents with  . Chest Pain  . Loss of Consciousness    Brief Narrative:  59 year old male with PMH of Severe Anxiety after recently being diagnosed with multiple lung nodules, CAD with LAD stent and chronically occluded RCA/collaterals who presents to the ED on 11/15 after a second episode of sudden syncope while sitting on bed.  Patient recently started valium for severe anxiety.  Echo shows no RWMA.  Cardiology Dr. Johnsie Cancel saw patient and will perform cath in the afternoon.  Patient can be likely discharged tomorrow with 30 day event monitor.   Assessment & Plan:   Principal Problem:   Syncope Active Problems:   Hypertension associated with diabetes ([place])   CAD (coronary artery disease)   Diabetes mellitus type II, non insulin dependent (HCC)   Collapse of right lung   COPD (chronic obstructive pulmonary disease) (HCC)   Hyperlipidemia associated with type 2 diabetes mellitus (HCC)   Acute Syncope - cardiac vs medication related - Echo shows no RWMA. - Cath was performed today.  Results are pending. - Discharge tomorrow with 30 day event monitor. - Decrease dosing of Valium on discharge for severe anxiety.   - Encourage oral hydration.  Bradycardia - Discontinue beta blocker.  CAD s/p stent with chronic occlusion of RCA:  - Aspirin and Statin  Diabetes Mellitus Type 2 - SSi with POC glucose q6hrs.  Hypertension - BP ok.  Hyperlipidemia - Statin.  COPD, not in acute exacerbation - Continue home inhalers.  Multiple Lung Nodules - Follow up with Pulmonary outpatient.    DVT prophylaxis: Lovenox Code Status: Full Family Communication: discussed with patient and daughter Disposition:   Status is: Observation  The patient remains OBS appropriate and will d/c before 2 midnights.  Dispo: The patient is from: Home               Anticipated d/c is to: Home              Anticipated d/c date is: 1 day              Patient currently is not medically stable to d/c.     Consultants:   Cardiology  Procedures:   11/16 L  Antimicrobials:   None   Subjective: Denies any dizziness, chest pain, shortness of breath, nausea, vomiting, diarrhea.  Is very anxious about his recent lung nodules diagnosis.  Objective: Vitals:   09/21/20 1200 09/21/20 1230 09/21/20 1300 09/21/20 1400  BP: 128/71 135/70 124/67 121/71  Pulse: 62 (!) 57 (!) 50 (!) 57  Resp: 15 18 14  (!) 24  Temp:      TempSrc:      SpO2: 96% 97% 96% 97%   No intake or output data in the 24 hours ending 09/21/20 1531 There were no vitals filed for this visit.  Examination:  General exam: Appears calm and comfortable  Respiratory system: Clear to auscultation. Respiratory effort normal. Cardiovascular system: S1 & S2 heard, RRR. No JVD, murmurs, rubs, gallops or clicks. No pedal edema. Gastrointestinal system: Abdomen is nondistended, soft and nontender. No organomegaly or masses felt. Normal bowel sounds heard. Central nervous system: Alert and oriented. No focal neurological deficits. Extremities: Symmetric 5 x 5 power. Skin: No rashes, lesions or ulcers Psychiatry: Judgement and insight appear normal. Mood & affect appropriate.     Data Reviewed:  I have personally reviewed following labs and imaging studies  CBC: Recent Labs  Lab 09/20/20 1131 09/21/20 0257  WBC 8.5  8.4 7.6  NEUTROABS 6.1  --   HGB 15.0  15.2 13.5  HCT 47.3  46.9 42.0  MCV 94.4  94.6 95.7  PLT 193  198 024    Basic Metabolic Panel: Recent Labs  Lab 09/20/20 1131 09/21/20 0257  NA 134* 135  K 3.7 3.5  CL 92* 95*  CO2 32 31  GLUCOSE 260* 259*  BUN 6* 7*  CREATININE 0.69 0.73  CALCIUM 8.9 8.6*  MG  --  1.8    GFR: Estimated Creatinine Clearance: 106.8 mL/min (by C-G formula based on SCr of 0.73 mg/dL).  Liver Function Tests: Recent Labs    Lab 09/20/20 1509  AST 27  ALT 31  ALKPHOS 75  BILITOT 0.6  PROT 6.9  ALBUMIN 3.9    CBG: Recent Labs  Lab 09/21/20 0517 09/21/20 0759 09/21/20 1235 09/21/20 1400 09/21/20 1507  GLUCAP 217* 161* 244* 206* 179*     Recent Results (from the past 240 hour(s))  Culture, blood (routine x 2)     Status: None (Preliminary result)   Collection Time: 09/20/20  3:14 PM   Specimen: BLOOD  Result Value Ref Range Status   Specimen Description BLOOD SITE NOT SPECIFIED  Final   Special Requests   Final    BOTTLES DRAWN AEROBIC AND ANAEROBIC Blood Culture adequate volume   Culture   Final    NO GROWTH < 24 HOURS Performed at [facility], [place] [address], [place], [place] [address]    Report Status PENDING  Incomplete  Culture, blood (routine x 2)     Status: None (Preliminary result)   Collection Time: 09/20/20  3:38 PM   Specimen: BLOOD  Result Value Ref Range Status   Specimen Description BLOOD RIGHT ANTECUBITAL  Final   Special Requests   Final    BOTTLES DRAWN AEROBIC AND ANAEROBIC Blood Culture results may not be optimal due to an excessive volume of blood received in culture bottles   Culture   Final    NO GROWTH < 24 HOURS Performed at [facility], [place] [address], [place], [place] [address]    Report Status PENDING  Incomplete  Respiratory Panel by RT PCR (Flu A&B, Covid) - Nasopharyngeal Swab     Status: None   Collection Time: 09/20/20  4:31 PM   Specimen: Nasopharyngeal Swab  Result Value Ref Range Status   SARS Coronavirus 2 by RT PCR NEGATIVE NEGATIVE Final    Comment: (NOTE) SARS-CoV-2 target nucleic acids are NOT DETECTED.  The SARS-CoV-2 RNA is generally detectable in upper respiratoy specimens during the acute phase of infection. The lowest concentration of SARS-CoV-2 viral copies this assay can detect is 131 copies/mL. A negative result does not preclude SARS-Cov-2 infection and should not be used as the sole basis for treatment or other  patient management decisions. A negative result may occur with  improper specimen collection/handling, submission of specimen other than nasopharyngeal swab, presence of viral mutation(s) within the areas targeted by this assay, and inadequate number of viral copies (<131 copies/mL). A negative result must be combined with clinical observations, patient history, and epidemiological information. The expected result is Negative.  Fact Sheet for Patients:  PinkCheek.be  Fact Sheet for Healthcare Providers:  GravelBags.it  This test is no t yet approved or cleared by the [place] FDA and  has  been authorized for detection and/or diagnosis of SARS-CoV-2 by FDA under an Emergency Use Authorization (EUA). This EUA will remain  in effect (meaning this test can be used) for the duration of the COVID-19 declaration under Section 564(b)(1) of the Act, 21 U.S.C. section 360bbb-3(b)(1), unless the authorization is terminated or revoked sooner.     Influenza A by PCR NEGATIVE NEGATIVE Final   Influenza B by PCR NEGATIVE NEGATIVE Final    Comment: (NOTE) The Xpert Xpress SARS-CoV-2/FLU/RSV assay is intended as an aid in  the diagnosis of influenza from Nasopharyngeal swab specimens and  should not be used as a sole basis for treatment. Nasal washings and  aspirates are unacceptable for Xpert Xpress SARS-CoV-2/FLU/RSV  testing.  Fact Sheet for Patients: PinkCheek.be  Fact Sheet for Healthcare Providers: GravelBags.it  This test is not yet approved or cleared by the [place] FDA and  has been authorized for detection and/or diagnosis of SARS-CoV-2 by  FDA under an Emergency Use Authorization (EUA). This EUA will remain  in effect (meaning this test can be used) for the duration of the  Covid-19 declaration under Section 564(b)(1) of the Act, 21  U.S.C. section  360bbb-3(b)(1), unless the authorization is  terminated or revoked. Performed at [facility], [place] [address], [place],  [address]         Radiology Studies: DG Chest 2 View  Result Date: 09/20/2020 CLINICAL DATA:  Chest pain and syncope EXAM: CHEST - 2 VIEW COMPARISON:  August 26, 2020 FINDINGS: There is airspace consolidation in portions of the right middle and lower lobes consistent with pneumonia. There is atelectatic change in the left base. Heart size and pulmonary vascularity are normal. No adenopathy. There is degenerative change in the thoracic spine. IMPRESSION: Airspace consolidation consistent with a degree of pneumonia in portions of the right middle lobe and right lower lobe regions. Atelectasis left base. Heart size within normal limits. No adenopathy evident. Electronically Signed   By: Lowella Grip III M.D.   On: 09/20/2020 11:52   CT Head Wo Contrast  Result Date: 09/20/2020 CLINICAL DATA:  Syncope. EXAM: CT HEAD WITHOUT CONTRAST TECHNIQUE: Contiguous axial images were obtained from the base of the skull through the vertex without intravenous contrast. COMPARISON:  None. FINDINGS: Brain: No evidence of acute infarction, hemorrhage, hydrocephalus, extra-axial collection or mass lesion/mass effect. Mild patchy white matter hypodensities, most likely related to chronic microvascular ischemic disease. Vascular: Calcific atherosclerosis. Skull: No acute fracture. Sinuses/Orbits: Mild scattered paranasal sinus mucosal thickening. Retention cyst in bilateral maxillary sinuses. Unremarkable orbits. Other: Small right mastoid effusion. IMPRESSION: No evidence of acute intracranial abnormality. Electronically Signed   By: Margaretha Sheffield MD   On: 09/20/2020 18:07   CT Angio Chest PE W/Cm &/Or Wo Cm  Result Date: 09/20/2020 CLINICAL DATA:  Syncope, chest pain, rule out PE EXAM: CT ANGIOGRAPHY CHEST WITH CONTRAST TECHNIQUE: Multidetector CT imaging of the chest was  performed using the standard protocol during bolus administration of intravenous contrast. Multiplanar CT image reconstructions and MIPs were obtained to evaluate the vascular anatomy. CONTRAST:  47mL OMNIPAQUE IOHEXOL 350 MG/ML SOLN COMPARISON:  07/30/2020 FINDINGS: Cardiovascular: Satisfactory opacification of the pulmonary arteries to the segmental level. No evidence of pulmonary embolism. Normal heart size. LAD coronary artery calcifications and stents. No pericardial effusion. Mediastinum/Nodes: No enlarged mediastinal, hilar, or axillary lymph nodes. Thyroid gland, trachea, and esophagus demonstrate no significant findings. Lungs/Pleura: Diffuse bilateral bronchial wall thickening. Frothy debris in the airways of the bilateral lower  lobes dependent bibasilar atelectasis or consolidation. No pleural effusion or pneumothorax. Upper Abdomen: No acute abnormality. Musculoskeletal: No chest wall abnormality. No acute or significant osseous findings. Review of the MIP images confirms the above findings. IMPRESSION: 1. Negative examination for pulmonary embolism. 2. Diffuse bilateral bronchial wall thickening with frothy debris in the airways of the bilateral lower lobes, findings consistent with nonspecific infectious or inflammatory bronchitis, possibly including a component of aspiration. 3. Dependent bibasilar atelectasis or consolidation. 4. Coronary artery disease. Electronically Signed   By: Eddie Candle M.D.   On: 09/20/2020 17:58   ECHOCARDIOGRAM COMPLETE  Result Date: 09/21/2020    ECHOCARDIOGRAM REPORT   Patient Name:   Todd [name] Date of Exam: 09/21/2020 Medical Rec #:  [id number]     Height:       67.0 in Accession #:    [id number]    Weight:       211.0 lb Date of Birth:  10/26/1961     BSA:          2.069 m Patient Age:    52 years      BP:           147/95 mmHg Patient Gender: M             HR:           65 bpm. Exam Location:  Inpatient Procedure: 2D Echo Indications:    780.2 syncope   History:        Patient has no prior history of Echocardiogram examinations. CAD                 and Previous Myocardial Infarction; Risk Factors:Diabetes,                 Dyslipidemia and Current Smoker. Tobacco abuse.  Sonographer:    Jannett Celestine RDCS (AE) Referring Phys: [id number] VISHAL R PATEL IMPRESSIONS  1. Left ventricular ejection fraction, by estimation, is 60 to 65%. The left ventricle has normal function. The left ventricle has no regional wall motion abnormalities. There is mild left ventricular hypertrophy. Left ventricular diastolic parameters were normal.  2. Right ventricular systolic function is normal. The right ventricular size is normal.  3. Left atrial size was mildly dilated.  4. The mitral valve is normal in structure. Trivial mitral valve regurgitation. No evidence of mitral stenosis.  5. The aortic valve is tricuspid. Aortic valve regurgitation is not visualized. Mild to moderate aortic valve sclerosis/calcification is present, without any evidence of aortic stenosis.  6. Aortic dilatation noted. There is mild dilatation of the ascending aorta, measuring 39 mm.  7. The inferior vena cava is normal in size with greater than 50% respiratory variability, suggesting right atrial pressure of 3 mmHg. FINDINGS  Left Ventricle: Left ventricular ejection fraction, by estimation, is 60 to 65%. The left ventricle has normal function. The left ventricle has no regional wall motion abnormalities. The left ventricular internal cavity size was normal in size. There is  mild left ventricular hypertrophy. Left ventricular diastolic parameters were normal. Right Ventricle: The right ventricular size is normal. No increase in right ventricular wall thickness. Right ventricular systolic function is normal. Left Atrium: Left atrial size was mildly dilated. Right Atrium: Right atrial size was normal in size. Pericardium: There is no evidence of pericardial effusion. Mitral Valve: The mitral valve is normal in  structure. Trivial mitral valve regurgitation. No evidence of mitral valve stenosis. Tricuspid Valve: The tricuspid valve is normal in structure. Tricuspid valve  regurgitation is not demonstrated. No evidence of tricuspid stenosis. Aortic Valve: The aortic valve is tricuspid. Aortic valve regurgitation is not visualized. Mild to moderate aortic valve sclerosis/calcification is present, without any evidence of aortic stenosis. Pulmonic Valve: The pulmonic valve was normal in structure. Pulmonic valve regurgitation is not visualized. No evidence of pulmonic stenosis. Aorta: The aortic root is normal in size and structure and aortic dilatation noted. There is mild dilatation of the ascending aorta, measuring 39 mm. Venous: The inferior vena cava is normal in size with greater than 50% respiratory variability, suggesting right atrial pressure of 3 mmHg. IAS/Shunts: No atrial level shunt detected by color flow Doppler.  LEFT VENTRICLE PLAX 2D LVIDd:         4.30 cm  Diastology LVIDs:         3.20 cm  LV e' medial:    7.83 cm/s LV PW:         1.30 cm  LV E/e' medial:  14.4 LV IVS:        1.40 cm  LV e' lateral:   10.00 cm/s LVOT diam:     2.50 cm  LV E/e' lateral: 11.3 LV SV:         171 LV SV Index:   83 LVOT Area:     4.91 cm  RIGHT VENTRICLE RV S prime:     11.70 cm/s TAPSE (M-mode): 2.4 cm LEFT ATRIUM             Index       RIGHT ATRIUM           Index LA diam:        4.10 cm 1.98 cm/m  RA Area:     14.30 cm LA Vol (A2C):   57.8 ml 27.94 ml/m RA Volume:   31.00 ml  14.98 ml/m LA Vol (A4C):   39.7 ml 19.19 ml/m LA Biplane Vol: 48.7 ml 23.54 ml/m  AORTIC VALVE LVOT Vmax:   135.00 cm/s LVOT Vmean:  94.200 cm/s LVOT VTI:    0.349 m  AORTA Ao Root diam: 3.20 cm MITRAL VALVE MV Area (PHT): 3.27 cm     SHUNTS MV Decel Time: 232 msec     Systemic VTI:  0.35 m MV E velocity: 113.00 cm/s  Systemic Diam: 2.50 cm MV A velocity: 59.10 cm/s MV E/A ratio:  1.91 Jenkins Rouge MD Electronically signed by Jenkins Rouge MD  Signature Date/Time: 09/21/2020/12:34:20 PM    Final      Scheduled Meds: . [MAR Hold] aspirin EC  81 mg Oral Daily  . [MAR Hold] atorvastatin  80 mg Oral QHS  . [MAR Hold] enoxaparin (LOVENOX) injection  40 mg Subcutaneous Q24H  . [MAR Hold] fluticasone furoate-vilanterol  1 puff Inhalation q AM  . [MAR Hold] insulin aspart  0-5 Units Subcutaneous QHS  . [MAR Hold] insulin aspart  0-9 Units Subcutaneous TID WC  . [MAR Hold] pantoprazole  40 mg Oral QHS  . [MAR Hold] sodium chloride flush  3 mL Intravenous Q12H  . [MAR Hold] sodium chloride flush  3 mL Intravenous Q12H  . [MAR Hold] umeclidinium bromide  1 puff Inhalation Daily   Continuous Infusions: . sodium chloride       LOS: 0 days    Time spent: 30 minutes    George Hugh, MD [facility]   To contact the attending provider between 7A-7P or the covering provider during after hours 7P-7A, please log into the web site [email or web address] and access  using universal [place] password for that web site. If you do not have the password, please call the hospital operator.  09/21/2020, 3:31 PM

## 2020-09-21 NOTE — Progress Notes (Signed)
Report called to [place], patient transferred without complication.

## 2020-09-21 NOTE — Progress Notes (Signed)
Patient passed out briefly while sitting on the edge of bed during placing the heart monitor and obtaining first set of vitals. Unable to obtain rhythm at time of episode. VSS. Patient now resting in bed A&O x 4. Placed O2 for patient comfort.  Dr Lavera Guise and Lucy Chris PA notified of episode.  Will continue to monitor.
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
## 2020-09-21 NOTE — Progress Notes (Addendum)
   Called to see pt re: syncope  Pt admitted w/ same, has fallen 2nd to this. Sx began in the last 2 days.   Had cath today w/ RCA 100% CTO >> med rx planned  Took his Coreg 11/15 am, but last pm and this am doses held due to bradycardia.  When pt being transferred, not yet on tele, +syncope  After pt on telemetry, he had another syncope episode. He had 10.9 sec pause at one point, other pauses shorter. Had slow junctional escape rhythm as well.   Spoke to Dr Tamala Julian and Dr Quentin Ore. Use Zoll for backup pacing for HR < 45. No defib.   Tx 2H, they are aware and order written.   Dr Lavera Guise messaged as well.   Pt currently feeling lightheaded and sleepy, HR currently in the 50s.   Pt has never been tested for sleep apnea, dtr says she was told by ER staff that his oxygen levels dropped when he slept.   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 Rigid bronchoscopy and thoracic surgery eval for possible debridement planned. Also w/ COPD. He is trying to quit smoking.   Continue to follow closely.    Rosaria Ferries, PA-C 09/21/2020 6:42 PM

## 2020-09-21 NOTE — ED Notes (Signed)
PT at bedside.

## 2020-09-21 NOTE — Progress Notes (Signed)
°  Echocardiogram 2D Echocardiogram has been performed.  Todd [name] 09/21/2020, 12:01 PM

## 2020-09-21 NOTE — ED Notes (Signed)
Entered room to investigate why BP not registering and noted that the pt had removed the cuff.
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
## 2020-09-21 NOTE — Evaluation (Signed)
Physical Therapy Evaluation Patient Details Name: Todd [name] MRN: [id number] DOB: [birth year] Today's Date: 09/21/2020   History of Present Illness  Pt is a 59 y/o male admitted secondary to syncopal episodes. Workup pending. PMH includes HTN, CAD, DM, COPD, tobacco use, and R TKA.   Clinical Impression  Pt admitted secondary to problem above with deficits below. Pt requiring min guard to stand and ambulate in ED room with cane. Performed without oxygen, and sats with brief drop to 86% on RA; unsure of accuracy as it did not have a good pleth. Very quickly returned to >90% on RA. Anticipate pt will progress well and will not require PT at home. Will continue to follow acutely.     Follow Up Recommendations No PT follow up    Equipment Recommendations  None recommended by PT    Recommendations for Other Services       Precautions / Restrictions Precautions Precautions: Other (comment) Precaution Comments: Watch O2 Restrictions Weight Bearing Restrictions: No      Mobility  Bed Mobility               General bed mobility comments: Sitting EOB upon entry     Transfers Overall transfer level: Needs assistance Equipment used: Straight cane Transfers: Sit to/from Stand Sit to Stand: Min guard         General transfer comment: Min guard for safety to stand from stretcher  Ambulation/Gait Ambulation/Gait assistance: Min guard Gait Distance (Feet): 20 Feet Assistive device: Straight cane Gait Pattern/deviations: Step-through pattern;Decreased stride length Gait velocity: Decreased   General Gait Details: Guarded gait with mild unsteadiness noted, however, no LOB. Performed gait training without oxygen. Did note drop to 86%, however, did not have good pleth and oxygen quickly returned to >90% on RA.   Stairs            Wheelchair Mobility    Modified Rankin (Stroke Patients Only)       Balance Overall balance assessment: Needs  assistance Sitting-balance support: No upper extremity supported;Feet supported Sitting balance-Leahy Scale: Good     Standing balance support: Single extremity supported;No upper extremity supported;During functional activity Standing balance-Leahy Scale: Fair Standing balance comment: Able to maintain static standing without UE support                              Pertinent Vitals/Pain Pain Assessment: Faces Faces Pain Scale: Hurts little more Pain Location: knees Pain Descriptors / Indicators: Grimacing;Guarding Pain Intervention(s): Limited activity within patient's tolerance;Monitored during session;Repositioned    Home Living Family/patient expects to be discharged to:: Private residence Living Arrangements: Spouse/significant other Available Help at Discharge: Family;Available 24 hours/day Type of Home: House Home Access: Stairs to enter Entrance Stairs-Rails: None Entrance Stairs-Number of Steps: 2 Home Layout: Two level;Able to live on main level with bedroom/bathroom Home Equipment: Kasandra Knudsen - single point      Prior Function Level of Independence: Independent with assistive device(s)         Comments: Using cane for ambulation     Hand Dominance        Extremity/Trunk Assessment   Upper Extremity Assessment Upper Extremity Assessment: Overall WFL for tasks assessed    Lower Extremity Assessment Lower Extremity Assessment: Generalized weakness    Cervical / Trunk Assessment Cervical / Trunk Assessment: Kyphotic  Communication   Communication: No difficulties  Cognition Arousal/Alertness: Awake/alert Behavior During Therapy: WFL for tasks assessed/performed Overall Cognitive Status: Within Functional  Limits for tasks assessed                                        General Comments General comments (skin integrity, edema, etc.): Pt's daughter present during session     Exercises     Assessment/Plan    PT Assessment  Patient needs continued PT services  PT Problem List Cardiopulmonary status limiting activity;Decreased strength;Decreased balance;Decreased activity tolerance;Decreased mobility       PT Treatment Interventions Gait training;DME instruction;Functional mobility training;Stair training;Therapeutic activities;Therapeutic exercise;Balance training;Patient/family education    PT Goals (Current goals can be found in the Care Plan section)  Acute Rehab PT Goals Patient Stated Goal: to go home PT Goal Formulation: With patient Time For Goal Achievement: 10/05/20 Potential to Achieve Goals: Good    Frequency Min 3X/week   Barriers to discharge        Co-evaluation               AM-PAC PT "6 Clicks" Mobility  Outcome Measure Help needed turning from your back to your side while in a flat bed without using bedrails?: None Help needed moving from lying on your back to sitting on the side of a flat bed without using bedrails?: None Help needed moving to and from a bed to a chair (including a wheelchair)?: A Little Help needed standing up from a chair using your arms (e.g., wheelchair or bedside chair)?: A Little Help needed to walk in hospital room?: A Little Help needed climbing 3-5 steps with a railing? : A Little 6 Click Score: 20    End of Session Equipment Utilized During Treatment: Gait belt Activity Tolerance: Patient tolerated treatment well Patient left: in bed;with call bell/phone within reach (on stretcher in ED ) Nurse Communication: Mobility status PT Visit Diagnosis: Other abnormalities of gait and mobility (R26.89);Muscle weakness (generalized) (M62.81)    Time: 6168-3729 PT Time Calculation (min) (ACUTE ONLY): 20 min   Charges:   PT Evaluation $PT Eval Low Complexity: 1 Low          Lou Miner, DPT  Acute Rehabilitation Services  Pager: [phone] Office: [phone]   Rudean Hitt 09/21/2020, 11:42 AM

## 2020-09-22 ENCOUNTER — Inpatient Hospital Stay (HOSPITAL_COMMUNITY)
Admission: EM | Disposition: A | Discharge status: Home Health | Payer: Self-pay | Source: Ambulatory Visit | Attending: Internal Medicine

## 2020-09-22 DIAGNOSIS — I2510 Atherosclerotic heart disease of native coronary artery without angina pectoris: Secondary | ICD-10-CM | POA: Diagnosis present

## 2020-09-22 DIAGNOSIS — E119 Type 2 diabetes mellitus without complications: Secondary | ICD-10-CM | POA: Diagnosis not present

## 2020-09-22 DIAGNOSIS — I252 Old myocardial infarction: Secondary | ICD-10-CM | POA: Diagnosis not present

## 2020-09-22 DIAGNOSIS — E1169 Type 2 diabetes mellitus with other specified complication: Secondary | ICD-10-CM

## 2020-09-22 DIAGNOSIS — I459 Conduction disorder, unspecified: Secondary | ICD-10-CM | POA: Diagnosis not present

## 2020-09-22 DIAGNOSIS — I152 Hypertension secondary to endocrine disorders: Secondary | ICD-10-CM | POA: Diagnosis present

## 2020-09-22 DIAGNOSIS — Z955 Presence of coronary angioplasty implant and graft: Secondary | ICD-10-CM | POA: Diagnosis not present

## 2020-09-22 DIAGNOSIS — E785 Hyperlipidemia, unspecified: Secondary | ICD-10-CM | POA: Diagnosis present

## 2020-09-22 DIAGNOSIS — J9601 Acute respiratory failure with hypoxia: Secondary | ICD-10-CM | POA: Diagnosis present

## 2020-09-22 DIAGNOSIS — F17290 Nicotine dependence, other tobacco product, uncomplicated: Secondary | ICD-10-CM | POA: Diagnosis present

## 2020-09-22 DIAGNOSIS — M25562 Pain in left knee: Secondary | ICD-10-CM | POA: Diagnosis present

## 2020-09-22 DIAGNOSIS — Z20822 Contact with and (suspected) exposure to covid-19: Secondary | ICD-10-CM | POA: Diagnosis present

## 2020-09-22 DIAGNOSIS — R001 Bradycardia, unspecified: Secondary | ICD-10-CM | POA: Diagnosis present

## 2020-09-22 DIAGNOSIS — Z7984 Long term (current) use of oral hypoglycemic drugs: Secondary | ICD-10-CM | POA: Diagnosis not present

## 2020-09-22 DIAGNOSIS — F17210 Nicotine dependence, cigarettes, uncomplicated: Secondary | ICD-10-CM | POA: Diagnosis present

## 2020-09-22 DIAGNOSIS — D1430 Benign neoplasm of unspecified bronchus and lung: Secondary | ICD-10-CM | POA: Diagnosis present

## 2020-09-22 DIAGNOSIS — F32A Depression, unspecified: Secondary | ICD-10-CM | POA: Diagnosis present

## 2020-09-22 DIAGNOSIS — I455 Other specified heart block: Principal | ICD-10-CM

## 2020-09-22 DIAGNOSIS — E669 Obesity, unspecified: Secondary | ICD-10-CM | POA: Diagnosis present

## 2020-09-22 DIAGNOSIS — I2582 Chronic total occlusion of coronary artery: Secondary | ICD-10-CM | POA: Diagnosis present

## 2020-09-22 DIAGNOSIS — I495 Sick sinus syndrome: Secondary | ICD-10-CM

## 2020-09-22 DIAGNOSIS — R918 Other nonspecific abnormal finding of lung field: Secondary | ICD-10-CM | POA: Diagnosis present

## 2020-09-22 DIAGNOSIS — K219 Gastro-esophageal reflux disease without esophagitis: Secondary | ICD-10-CM | POA: Diagnosis present

## 2020-09-22 DIAGNOSIS — Z6832 Body mass index (BMI) 32.0-32.9, adult: Secondary | ICD-10-CM | POA: Diagnosis not present

## 2020-09-22 DIAGNOSIS — G8929 Other chronic pain: Secondary | ICD-10-CM | POA: Diagnosis present

## 2020-09-22 DIAGNOSIS — J449 Chronic obstructive pulmonary disease, unspecified: Secondary | ICD-10-CM | POA: Diagnosis present

## 2020-09-22 DIAGNOSIS — M1990 Unspecified osteoarthritis, unspecified site: Secondary | ICD-10-CM | POA: Diagnosis present

## 2020-09-22 DIAGNOSIS — R55 Syncope and collapse: Secondary | ICD-10-CM | POA: Diagnosis present

## 2020-09-22 HISTORY — PX: PACEMAKER IMPLANT: EP1218

## 2020-09-22 LAB — BASIC METABOLIC PANEL
Anion gap: 8 (ref 5–15)
BUN: 5 mg/dL — ABNORMAL LOW (ref 8–23)
CO2: 31 mmol/L (ref 22–32)
Calcium: 8.7 mg/dL — ABNORMAL LOW (ref 8.9–10.3)
Chloride: 97 mmol/L — ABNORMAL LOW (ref 98–111)
Creatinine, Ser: 0.63 mg/dL (ref 0.61–1.24)
GFR, Estimated: >60 mL/min (ref >60)
Glucose, Bld: 151 mg/dL — ABNORMAL HIGH (ref 70–99)
Potassium: 3.8 mmol/L (ref 3.5–5.1)
Sodium: 136 mmol/L (ref 135–145)

## 2020-09-22 LAB — CBC
HCT: 43.6 % (ref 39.0–52.0)
Hemoglobin: 14.2 g/dL (ref 13.0–17.0)
MCH: 30.4 pg (ref 26.0–34.0)
MCHC: 32.6 g/dL (ref 30.0–36.0)
MCV: 93.4 fL (ref 80.0–100.0)
Platelets: 170 10*3/uL (ref 150–400)
RBC: 4.67 MIL/uL (ref 4.22–5.81)
RDW: 13.1 % (ref 11.5–15.5)
WBC: 9.2 10*3/uL (ref 4.0–10.5)
nRBC: 0 % (ref 0.0–0.2)

## 2020-09-22 LAB — GLUCOSE, CAPILLARY
Glucose-Capillary: 165 mg/dL — ABNORMAL HIGH (ref 70–99)
Glucose-Capillary: 153 mg/dL — ABNORMAL HIGH (ref 70–99)
Glucose-Capillary: 144 mg/dL — ABNORMAL HIGH (ref 70–99)
Glucose-Capillary: 194 mg/dL — ABNORMAL HIGH (ref 70–99)

## 2020-09-22 LAB — HIV ANTIBODY (ROUTINE TESTING W REFLEX): HIV Screen 4th Generation wRfx: NONREACTIVE

## 2020-09-22 LAB — SURGICAL PCR SCREEN
MRSA, PCR: NEGATIVE
Staphylococcus aureus: NEGATIVE

## 2020-09-22 LAB — MRSA PCR SCREENING: MRSA by PCR: NEGATIVE

## 2020-09-22 SURGERY — PACEMAKER IMPLANT

## 2020-09-22 MED ORDER — DIAZEPAM 5 MG PO TABS
5.0000 mg | ORAL_TABLET | Freq: Three times a day (TID) | ORAL | Status: DC | PRN
  Administered 2020-09-22 (×2): 5 mg via ORAL
  Filled 2020-09-22 (×2): qty 1

## 2020-09-22 MED ORDER — MIDAZOLAM HCL 5 MG/5ML IJ SOLN
INTRAMUSCULAR | Status: AC
  Filled 2020-09-22: qty 5

## 2020-09-22 MED ORDER — VANCOMYCIN HCL IN DEXTROSE 1-5 GM/200ML-% IV SOLN: 1000.0000 mg | INTRAVENOUS | Status: DC

## 2020-09-22 MED ORDER — FENTANYL CITRATE (PF) 100 MCG/2ML IJ SOLN
INTRAMUSCULAR | Status: DC | PRN
  Administered 2020-09-22: 50 ug via INTRAVENOUS
  Administered 2020-09-22: 25 ug via INTRAVENOUS

## 2020-09-22 MED ORDER — SODIUM CHLORIDE 0.9 % IV SOLN: INTRAVENOUS | Status: DC

## 2020-09-22 MED ORDER — MIDAZOLAM HCL 5 MG/5ML IJ SOLN
INTRAMUSCULAR | Status: DC | PRN
  Administered 2020-09-22: 1 mg via INTRAVENOUS
  Administered 2020-09-22: 3 mg via INTRAVENOUS

## 2020-09-22 MED ORDER — ONDANSETRON HCL 4 MG/2ML IJ SOLN: 4.0000 mg | Freq: Four times a day (QID) | INTRAMUSCULAR | Status: DC | PRN

## 2020-09-22 MED ORDER — FENTANYL CITRATE (PF) 100 MCG/2ML IJ SOLN
INTRAMUSCULAR | Status: AC
  Filled 2020-09-22: qty 2

## 2020-09-22 MED ORDER — CHLORHEXIDINE GLUCONATE 4 % EX LIQD
60.0000 mL | Freq: Once | CUTANEOUS | Status: AC
  Administered 2020-09-22: 4 via TOPICAL
  Filled 2020-09-22: qty 60

## 2020-09-22 MED ORDER — VANCOMYCIN HCL 10 G IV SOLR
INTRAVENOUS | Status: DC | PRN
  Administered 2020-09-22: 1500 mg via INTRAVENOUS

## 2020-09-22 MED ORDER — MUPIROCIN 2 % EX OINT: 1.0000 "application " | TOPICAL_OINTMENT | Freq: Two times a day (BID) | CUTANEOUS | Status: DC

## 2020-09-22 MED ORDER — ORAL CARE MOUTH RINSE
15.0000 mL | Freq: Two times a day (BID) | OROMUCOSAL | Status: DC
  Administered 2020-09-22 – 2020-09-23 (×2): 15 mL via OROMUCOSAL

## 2020-09-22 MED ORDER — LOSARTAN POTASSIUM 50 MG PO TABS
50.0000 mg | ORAL_TABLET | Freq: Every day | ORAL | Status: DC
  Administered 2020-09-22 – 2020-09-23 (×2): 50 mg via ORAL
  Filled 2020-09-22 (×2): qty 1

## 2020-09-22 MED ORDER — SODIUM CHLORIDE 0.9 % IV SOLN: 250.0000 mL | INTRAVENOUS | Status: DC | PRN

## 2020-09-22 MED ORDER — ACETAMINOPHEN 325 MG PO TABS: 325.0000 mg | ORAL_TABLET | ORAL | Status: DC | PRN

## 2020-09-22 MED ORDER — MORPHINE SULFATE (PF) 2 MG/ML IV SOLN
2.0000 mg | INTRAVENOUS | Status: DC | PRN
  Administered 2020-09-22: 2 mg via INTRAVENOUS
  Filled 2020-09-22: qty 1

## 2020-09-22 MED ORDER — SODIUM CHLORIDE 0.9% FLUSH: 3.0000 mL | INTRAVENOUS | Status: DC | PRN

## 2020-09-22 MED ORDER — VANCOMYCIN HCL IN DEXTROSE 1-5 GM/200ML-% IV SOLN
INTRAVENOUS | Status: AC
  Filled 2020-09-22: qty 200

## 2020-09-22 MED ORDER — VANCOMYCIN HCL IN DEXTROSE 1-5 GM/200ML-% IV SOLN
1000.0000 mg | Freq: Two times a day (BID) | INTRAVENOUS | Status: AC
  Administered 2020-09-23: 1000 mg via INTRAVENOUS
  Filled 2020-09-22: qty 200

## 2020-09-22 MED ORDER — LIDOCAINE HCL (PF) 1 % IJ SOLN
INTRAMUSCULAR | Status: DC | PRN
  Administered 2020-09-22: 15 mL

## 2020-09-22 MED ORDER — LIDOCAINE HCL 1 % IJ SOLN
INTRAMUSCULAR | Status: AC
  Filled 2020-09-22: qty 60

## 2020-09-22 MED ORDER — SODIUM CHLORIDE 0.9 % IV SOLN
80.0000 mg | INTRAVENOUS | Status: DC
  Filled 2020-09-22: qty 2

## 2020-09-22 MED ORDER — SODIUM CHLORIDE 0.9 % IV SOLN
INTRAVENOUS | Status: AC
  Filled 2020-09-22: qty 2

## 2020-09-22 MED FILL — Verapamil HCl IV Soln 2.5 MG/ML: INTRAVENOUS | Qty: 2 | Status: AC

## 2020-09-22 SURGICAL SUPPLY — 9 items
CABLE SURGICAL S-101-97-12 (CABLE) ×2 IMPLANT
IPG PACE AZUR XT DR MRI W1DR01 (Pacemaker) IMPLANT
LEAD CAPSURE NOVUS 5076-52CM (Lead) ×1 IMPLANT
LEAD CAPSURE NOVUS 5076-58CM (Lead) ×1 IMPLANT
PACE AZURE XT DR MRI W1DR01 (Pacemaker) ×2 IMPLANT
PAD PRO RADIOLUCENT 2001M-C (PAD) ×2 IMPLANT
SHEATH 7FR PRELUDE SNAP 13 (SHEATH) ×2 IMPLANT
SHEATH PROBE COVER 6X72 (BAG) ×1 IMPLANT
TRAY PACEMAKER INSERTION (PACKS) ×2 IMPLANT

## 2020-09-22 NOTE — Progress Notes (Signed)
Subjective:  Denies SSCP, palpitations or Dyspnea He relies on wife / family a lot Seems to process info slowly  Objective:  Vitals:   09/22/20 0300 09/22/20 0400 09/22/20 0500 09/22/20 0600  BP: (!) 153/75 140/80 132/61 (!) 152/73  Pulse: (!) 57 68 61 (!) 59  Resp: 15 (!) 25 (!) 29 15  Temp:  98 F (36.7 C)    TempSrc:  Oral    SpO2: 95% 98% 94% 96%  Weight:    95.7 kg  Height:        Intake/Output from previous day:  Intake/Output Summary (Last 24 hours) at 09/22/2020 0851 Last data filed at 09/22/2020 0800 Gross per 24 hour  Intake 420 ml  Output 925 ml  Net -505 ml    Physical Exam: Lungs clear No bruit Previous pinning right wrist with no hematoma at right radial cath site Abdomen soft No edema   Lab Results: Basic Metabolic Panel: Recent Labs    09/21/20 0257 09/22/20 0312  NA 135 136  K 3.5 3.8  CL 95* 97*  CO2 31 31  GLUCOSE 259* 151*  BUN 7* 5*  CREATININE 0.73 0.63  CALCIUM 8.6* 8.7*  MG 1.8  --    Liver Function Tests: Recent Labs    09/20/20 1509  AST 27  ALT 31  ALKPHOS 75  BILITOT 0.6  PROT 6.9  ALBUMIN 3.9   No results for input(s): LIPASE, AMYLASE in the last 72 hours. CBC: Recent Labs    09/20/20 1131 09/20/20 1131 09/21/20 0257 09/22/20 0312  WBC 8.5  8.4   < > 7.6 9.2  NEUTROABS 6.1  --   --   --   HGB 15.0  15.2   < > 13.5 14.2  HCT 47.3  46.9   < > 42.0 43.6  MCV 94.4  94.6   < > 95.7 93.4  PLT 193  198   < > 170 170   < > = values in this interval not displayed.   Cardiac Enzymes: No results for input(s): CKTOTAL, CKMB, CKMBINDEX, TROPONINI in the last 72 hours. BNP: Invalid input(s): POCBNP D-Dimer: No results for input(s): DDIMER in the last 72 hours. Hemoglobin A1C: Recent Labs    09/21/20 0257  HGBA1C 7.9*   Fasting Lipid Panel: No results for input(s): CHOL, HDL, LDLCALC, TRIG, CHOLHDL, LDLDIRECT in the last 72 hours. Thyroid Function Tests: No results for input(s): TSH, T4TOTAL, T3FREE,  THYROIDAB in the last 72 hours.  Invalid input(s): FREET3 Anemia Panel: No results for input(s): VITAMINB12, FOLATE, FERRITIN, TIBC, IRON, RETICCTPCT in the last 72 hours.  Imaging: DG Chest 2 View  Result Date: 09/20/2020 CLINICAL DATA:  Chest pain and syncope EXAM: CHEST - 2 VIEW COMPARISON:  August 26, 2020 FINDINGS: There is airspace consolidation in portions of the right middle and lower lobes consistent with pneumonia. There is atelectatic change in the left base. Heart size and pulmonary vascularity are normal. No adenopathy. There is degenerative change in the thoracic spine. IMPRESSION: Airspace consolidation consistent with a degree of pneumonia in portions of the right middle lobe and right lower lobe regions. Atelectasis left base. Heart size within normal limits. No adenopathy evident. Electronically Signed   By: Lowella Grip III M.D.   On: 09/20/2020 11:52   CT Head Wo Contrast  Result Date: 09/20/2020 CLINICAL DATA:  Syncope. EXAM: CT HEAD WITHOUT CONTRAST TECHNIQUE: Contiguous axial images were obtained from the base of the skull through the vertex without intravenous  contrast. COMPARISON:  None. FINDINGS: Brain: No evidence of acute infarction, hemorrhage, hydrocephalus, extra-axial collection or mass lesion/mass effect. Mild patchy white matter hypodensities, most likely related to chronic microvascular ischemic disease. Vascular: Calcific atherosclerosis. Skull: No acute fracture. Sinuses/Orbits: Mild scattered paranasal sinus mucosal thickening. Retention cyst in bilateral maxillary sinuses. Unremarkable orbits. Other: Small right mastoid effusion. IMPRESSION: No evidence of acute intracranial abnormality. Electronically Signed   By: Margaretha Sheffield MD   On: 09/20/2020 18:07   CT Angio Chest PE W/Cm &/Or Wo Cm  Result Date: 09/20/2020 CLINICAL DATA:  Syncope, chest pain, rule out PE EXAM: CT ANGIOGRAPHY CHEST WITH CONTRAST TECHNIQUE: Multidetector CT imaging of the  chest was performed using the standard protocol during bolus administration of intravenous contrast. Multiplanar CT image reconstructions and MIPs were obtained to evaluate the vascular anatomy. CONTRAST:  34mL OMNIPAQUE IOHEXOL 350 MG/ML SOLN COMPARISON:  07/30/2020 FINDINGS: Cardiovascular: Satisfactory opacification of the pulmonary arteries to the segmental level. No evidence of pulmonary embolism. Normal heart size. LAD coronary artery calcifications and stents. No pericardial effusion. Mediastinum/Nodes: No enlarged mediastinal, hilar, or axillary lymph nodes. Thyroid gland, trachea, and esophagus demonstrate no significant findings. Lungs/Pleura: Diffuse bilateral bronchial wall thickening. Frothy debris in the airways of the bilateral lower lobes dependent bibasilar atelectasis or consolidation. No pleural effusion or pneumothorax. Upper Abdomen: No acute abnormality. Musculoskeletal: No chest wall abnormality. No acute or significant osseous findings. Review of the MIP images confirms the above findings. IMPRESSION: 1. Negative examination for pulmonary embolism. 2. Diffuse bilateral bronchial wall thickening with frothy debris in the airways of the bilateral lower lobes, findings consistent with nonspecific infectious or inflammatory bronchitis, possibly including a component of aspiration. 3. Dependent bibasilar atelectasis or consolidation. 4. Coronary artery disease. Electronically Signed   By: Eddie Candle M.D.   On: 09/20/2020 17:58   CARDIAC CATHETERIZATION  Result Date: 09/21/2020  Prox LAD to Mid LAD lesion is 15% stenosed.  Prox LAD lesion is 40% stenosed.  Mid LAD lesion is 30% stenosed.  Prox RCA to Mid RCA lesion is 100% stenosed.  LV end diastolic pressure is moderately elevated.  1. 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 2. Patent stent in the LAD 3. Moderately elevated LVEDP 25 mm Hg Plan: continue medical therapy.   ECHOCARDIOGRAM  COMPLETE  Result Date: 09/21/2020    ECHOCARDIOGRAM REPORT   Patient Name:   Todd [name] Todd [name] Date of Exam: 09/21/2020 Medical Rec #:  [id number]     Height:       67.0 in Accession #:    [id number]    Weight:       211.0 lb Date of Birth:  1961-01-05     BSA:          2.069 m Patient Age:    72 years      BP:           147/95 mmHg Patient Gender: M             HR:           65 bpm. Exam Location:  Inpatient Procedure: 2D Echo Indications:    780.2 syncope  History:        Patient has no prior history of Echocardiogram examinations. CAD                 and Previous Myocardial Infarction; Risk Factors:Diabetes,  Dyslipidemia and Current Smoker. Tobacco abuse.  Sonographer:    Jannett Celestine RDCS (AE) Referring Phys: [id number] VISHAL R PATEL IMPRESSIONS  1. Left ventricular ejection fraction, by estimation, is 60 to 65%. The left ventricle has normal function. The left ventricle has no regional wall motion abnormalities. There is mild left ventricular hypertrophy. Left ventricular diastolic parameters were normal.  2. Right ventricular systolic function is normal. The right ventricular size is normal.  3. Left atrial size was mildly dilated.  4. The mitral valve is normal in structure. Trivial mitral valve regurgitation. No evidence of mitral stenosis.  5. The aortic valve is tricuspid. Aortic valve regurgitation is not visualized. Mild to moderate aortic valve sclerosis/calcification is present, without any evidence of aortic stenosis.  6. Aortic dilatation noted. There is mild dilatation of the ascending aorta, measuring 39 mm.  7. The inferior vena cava is normal in size with greater than 50% respiratory variability, suggesting right atrial pressure of 3 mmHg. FINDINGS  Left Ventricle: Left ventricular ejection fraction, by estimation, is 60 to 65%. The left ventricle has normal function. The left ventricle has no regional wall motion abnormalities. The left ventricular internal cavity size was normal in  size. There is  mild left ventricular hypertrophy. Left ventricular diastolic parameters were normal. Right Ventricle: The right ventricular size is normal. No increase in right ventricular wall thickness. Right ventricular systolic function is normal. Left Atrium: Left atrial size was mildly dilated. Right Atrium: Right atrial size was normal in size. Pericardium: There is no evidence of pericardial effusion. Mitral Valve: The mitral valve is normal in structure. Trivial mitral valve regurgitation. No evidence of mitral valve stenosis. Tricuspid Valve: The tricuspid valve is normal in structure. Tricuspid valve regurgitation is not demonstrated. No evidence of tricuspid stenosis. Aortic Valve: The aortic valve is tricuspid. Aortic valve regurgitation is not visualized. Mild to moderate aortic valve sclerosis/calcification is present, without any evidence of aortic stenosis. Pulmonic Valve: The pulmonic valve was normal in structure. Pulmonic valve regurgitation is not visualized. No evidence of pulmonic stenosis. Aorta: The aortic root is normal in size and structure and aortic dilatation noted. There is mild dilatation of the ascending aorta, measuring 39 mm. Venous: The inferior vena cava is normal in size with greater than 50% respiratory variability, suggesting right atrial pressure of 3 mmHg. IAS/Shunts: No atrial level shunt detected by color flow Doppler.  LEFT VENTRICLE PLAX 2D LVIDd:         4.30 cm  Diastology LVIDs:         3.20 cm  LV e' medial:    7.83 cm/s LV PW:         1.30 cm  LV E/e' medial:  14.4 LV IVS:        1.40 cm  LV e' lateral:   10.00 cm/s LVOT diam:     2.50 cm  LV E/e' lateral: 11.3 LV SV:         171 LV SV Index:   83 LVOT Area:     4.91 cm  RIGHT VENTRICLE RV S prime:     11.70 cm/s TAPSE (M-mode): 2.4 cm LEFT ATRIUM             Index       RIGHT ATRIUM           Index LA diam:        4.10 cm 1.98 cm/m  RA Area:     14.30 cm LA Vol (A2C):   57.8  ml 27.94 ml/m RA Volume:   31.00 ml   14.98 ml/m LA Vol (A4C):   39.7 ml 19.19 ml/m LA Biplane Vol: 48.7 ml 23.54 ml/m  AORTIC VALVE LVOT Vmax:   135.00 cm/s LVOT Vmean:  94.200 cm/s LVOT VTI:    0.349 m  AORTA Ao Root diam: 3.20 cm MITRAL VALVE MV Area (PHT): 3.27 cm     SHUNTS MV Decel Time: 232 msec     Systemic VTI:  0.35 m MV E velocity: 113.00 cm/s  Systemic Diam: 2.50 cm MV A velocity: 59.10 cm/s MV E/A ratio:  1.91 Jenkins Rouge MD Electronically signed by Jenkins Rouge MD Signature Date/Time: 09/21/2020/12:34:20 PM    Final     Cardiac Studies:  ECG: SB rate 58 11/15   Telemetry: SR rates 70-80   Echo: EF 60-65%   Medications:   . aspirin EC  81 mg Oral Daily  . atorvastatin  80 mg Oral QHS  . Chlorhexidine Gluconate Cloth  6 each Topical Daily  . enoxaparin (LOVENOX) injection  40 mg Subcutaneous Q24H  . fluticasone furoate-vilanterol  1 puff Inhalation q AM  . insulin aspart  0-5 Units Subcutaneous QHS  . insulin aspart  0-9 Units Subcutaneous TID WC  . losartan  50 mg Oral Daily  . nicotine  21 mg Transdermal Daily  . pantoprazole  40 mg Oral QHS  . sodium chloride flush  3 mL Intravenous Q12H  . sodium chloride flush  3 mL Intravenous Q12H  . umeclidinium bromide  1 puff Inhalation Daily     . sodium chloride    . sodium chloride      Assessment/Plan:   1. Syncope:  Cath yesterday with stable CAD collaterals to RCA and patent LAD stent. Echo with normal EF no new RWMA;s Had 10 second pause with heart block post cath. ? Dr Smith/Lambert involved last night. Coreg held being watched on monitor For wash-out.  EP to formally see today If pauses recur off coreg will need PPM. Will need 30 day event monitor and close f/u if No recurrence off coreg   Jenkins Rouge 09/22/2020, 8:51 AM

## 2020-09-22 NOTE — Plan of Care (Signed)
  Problem: Activity: Goal: Ability to return to baseline activity level will improve Outcome: Progressing   Problem: Cardiovascular: Goal: Ability to achieve and maintain adequate cardiovascular perfusion will improve Outcome: Progressing Goal: Vascular access site(s) Level 0-1 will be maintained Outcome: Progressing   Problem: Clinical Measurements: Goal: Ability to maintain clinical measurements within normal limits will improve Outcome: Progressing   Problem: Nutrition: Goal: Adequate nutrition will be maintained Outcome: Progressing   Problem: Coping: Goal: Level of anxiety will decrease Outcome: Progressing Note: MD to resume home valium.   Problem: Elimination: Goal: Will not experience complications related to urinary retention Outcome: Progressing

## 2020-09-22 NOTE — Progress Notes (Signed)
Progress Note    Todd [name]  NUU:[id number] DOB: 03-09-1961  DOA: 09/20/2020 PCP: [name], MD    Brief Narrative:     Medical records reviewed and are as summarized below:  Todd [name] is an 59 y.o. 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  On telemetry found to have a 10 second pause resulting in syncope.  Plan is for pacemaker on 11/17.    Assessment/Plan:   Principal Problem:   Syncope Active Problems:   Hypertension associated with diabetes ([place])   CAD (coronary artery disease)   Diabetes mellitus type II, non insulin dependent (HCC)   Benign essential HTN   Collapse of right lung   COPD (chronic obstructive pulmonary disease) (HCC)   Hyperlipidemia associated with type 2 diabetes mellitus (HCC)   Sinus arrest   Sinus arrest with syncope -10 sec pause per tele resulting in syncope -seen by EP: plan for pacemaker on 11/17  Tobacco abuse -encourage cessation  HLD -statin  DM type 2 -SSI -carb mod diet -A1c: 7.9  Anxiety -resume home valium  HTN -d/c coreg for now  -resume cozaar  Recent bronch by Pulm for lung nodules -pulm hamartoma -outpatient follow up  obesity Body mass index is 32.56 kg/m.   Seen in room on nasal cannula.  Have asked for home O2 study and wean to room air.  No hypoxia documented and patient not on O2 at home   Family Communication/Anticipated D/C date and plan/Code Status   DVT prophylaxis: scd Code Status: Full Code.  Disposition Plan: Status is: Observation  The patient will require care spanning > 2 midnights and should be moved to inpatient because: Inpatient level of care appropriate due to severity of illness  Dispo: The patient is from: Home              Anticipated d/c is to: Home              Anticipated d/c  date is: 1 day              Patient currently is not medically stable to d/c.-- getting pacemaker 11/17         Medical Consultants:    EP/cards  Subjective:   Agreeable to pacemaker today  Objective:    Vitals:   09/22/20 0800 09/22/20 0900 09/22/20 0920 09/22/20 0928  BP: (!) 171/85 133/90    Pulse: 76 76 90 76  Resp: (!) 38 (!) 30 (!) 38 (!) 24  Temp:      TempSrc:      SpO2: 96% 96% 96%   Weight:      Height:        Intake/Output Summary (Last 24 hours) at 09/22/2020 0953 Last data filed at 09/22/2020 0800 Gross per 24 hour  Intake 780 ml  Output 925 ml  Net -145 ml   Filed Weights   09/21/20 1700 09/22/20 0600  Weight: 94.9 kg 95.7 kg    Exam:  General: Appearance:    Obese male in no acute distress     Lungs:     Clear to auscultation bilaterally, respirations unlabored, on [place], mild work of breathing with speaking  Heart:    Normal heart rate. Normal rhythm. No murmurs, rubs, or gallops.   MS:   All extremities are intact.  Neurologic:   Awake, alert, oriented x 3. No apparent focal neurological           defect.     Data Reviewed:   I have personally reviewed following labs and imaging studies:  Labs: Labs show the following:   Basic Metabolic Panel: Recent Labs  Lab 09/20/20 1131 09/20/20 1131 09/21/20 0257 09/22/20 0312  NA 134*  --  135 136  K 3.7   < > 3.5 3.8  CL 92*  --  95* 97*  CO2 32  --  31 31  GLUCOSE 260*  --  259* 151*  BUN 6*  --  7* 5*  CREATININE 0.69  --  0.73 0.63  CALCIUM 8.9  --  8.6* 8.7*  MG  --   --  1.8  --    < > = values in this interval not displayed.   GFR Estimated Creatinine Clearance: 107.9 mL/min (by C-G formula based on SCr of 0.63 mg/dL). Liver Function Tests: Recent Labs  Lab 09/20/20 1509  AST 27  ALT 31  ALKPHOS 75  BILITOT 0.6  PROT 6.9  ALBUMIN 3.9   No results for input(s): LIPASE, AMYLASE in the last 168 hours. No results for input(s): AMMONIA in the last 168  hours. Coagulation profile No results for input(s): INR, PROTIME in the last 168 hours.  CBC: Recent Labs  Lab 09/20/20 1131 09/21/20 0257 09/22/20 0312  WBC 8.5   8.4 7.6 9.2  NEUTROABS 6.1  --   --   HGB 15.0   15.2 13.5 14.2  HCT 47.3   46.9 42.0 43.6  MCV 94.4   94.6 95.7 93.4  PLT 193   198 170 170   Cardiac Enzymes: No results for input(s): CKTOTAL, CKMB, CKMBINDEX, TROPONINI in the last 168 hours. BNP (last 3 results) No results for input(s): PROBNP in the last 8760 hours. CBG: Recent Labs  Lab 09/21/20 1400 09/21/20 1507 09/21/20 1730 09/21/20 2151 09/22/20 0638  GLUCAP 206* 179* 141* 194* 165*   D-Dimer: No results for input(s): DDIMER in the last 72 hours. Hgb A1c: Recent Labs    09/21/20 0257  HGBA1C 7.9*   Lipid Profile: No results for input(s): CHOL, HDL, LDLCALC, TRIG, CHOLHDL, LDLDIRECT in the last 72 hours. Thyroid function studies: No results for input(s): TSH, T4TOTAL, T3FREE, THYROIDAB in the last 72 hours.  Invalid input(s): FREET3 Anemia work up: No results for input(s): VITAMINB12, FOLATE, FERRITIN, TIBC, IRON, RETICCTPCT in the last 72 hours. Sepsis Labs: Recent Labs  Lab 09/20/20 1131 09/20/20 1509 09/20/20 1709 09/21/20 0257 09/22/20 0312  WBC 8.5   8.4  --   --  7.6 9.2  LATICACIDVEN  --  2.8* 1.4  --   --     Microbiology Recent Results (from the past 240 hour(s))  Culture, blood (routine x 2)     Status: None (Preliminary result)   Collection Time: 09/20/20  3:14 PM   Specimen: BLOOD  Result Value Ref Range Status   Specimen Description BLOOD SITE NOT SPECIFIED  Final   Special Requests   Final    BOTTLES DRAWN AEROBIC AND ANAEROBIC Blood Culture adequate volume   Culture   Final    NO GROWTH < 24 HOURS Performed at [facility], [place] [address], [place], [place] [address]    Report Status PENDING  Incomplete  Culture, blood (routine x 2)     Status: None (Preliminary result)   Collection Time: 09/20/20  3:38  PM   Specimen: BLOOD  Result Value Ref Range Status   Specimen Description BLOOD RIGHT ANTECUBITAL  Final   Special Requests   Final    BOTTLES DRAWN AEROBIC AND ANAEROBIC Blood Culture results may not be optimal due to an excessive volume of blood received in culture bottles   Culture   Final    NO GROWTH < 24 HOURS Performed at [facility] [address], [place], [place] [address]    Report Status PENDING  Incomplete  Respiratory Panel by RT PCR (Flu A&B, Covid) - Nasopharyngeal Swab     Status: None   Collection Time: 09/20/20  4:31 PM   Specimen: Nasopharyngeal Swab  Result Value Ref Range Status   SARS Coronavirus 2 by RT PCR NEGATIVE NEGATIVE Final    Comment: (NOTE) SARS-CoV-2 target nucleic acids are NOT DETECTED.  The SARS-CoV-2 RNA is generally detectable in upper respiratoy specimens during the acute phase of infection. The lowest concentration of SARS-CoV-2 viral copies this assay can detect is 131 copies/mL. A negative result does not preclude SARS-Cov-2 infection and should not be used as the sole basis for treatment or other patient management decisions. A negative result may occur with  improper specimen collection/handling, submission of specimen other than nasopharyngeal swab, presence of viral mutation(s) within the areas targeted by this assay, and inadequate number of viral copies (<131 copies/mL). A negative result must be combined with clinical observations, patient history, and epidemiological information. The expected result is Negative.  Fact Sheet for Patients:  PinkCheek.be  Fact Sheet for Healthcare Providers:  GravelBags.it  This test is no t yet approved or cleared by the [place] FDA and  has been authorized for detection and/or diagnosis of SARS-CoV-2 by FDA under an Emergency Use Authorization (EUA). This EUA will remain  in effect (meaning this test can be used) for the  duration of the COVID-19 declaration under Section 564(b)(1) of the Act, 21 U.S.C. section 360bbb-3(b)(1), unless the authorization is terminated or revoked sooner.     Influenza A by PCR NEGATIVE NEGATIVE Final   Influenza B by PCR NEGATIVE NEGATIVE Final    Comment: (NOTE) The Xpert Xpress SARS-CoV-2/FLU/RSV assay is intended as an aid in  the diagnosis of influenza from Nasopharyngeal swab specimens and  should not be used as a sole basis for treatment. Nasal washings and  aspirates are unacceptable for Xpert Xpress SARS-CoV-2/FLU/RSV  testing.  Fact Sheet for Patients: PinkCheek.be  Fact Sheet for Healthcare Providers: GravelBags.it  This test is not yet approved or cleared by the [place] FDA and  has been authorized for detection and/or diagnosis of SARS-CoV-2 by  FDA under an Emergency Use Authorization (EUA). This EUA will remain  in effect (meaning this test can be used) for the duration of the  Covid-19 declaration under Section 564(b)(1) of the Act, 21  U.S.C. section 360bbb-3(b)(1), unless the authorization is  terminated or revoked. Performed at [facility], [place] [address], [place], [place] [address]   MRSA PCR Screening     Status: None   Collection Time: 09/21/20  9:56 PM   Specimen: Nasal Mucosa; Nasopharyngeal  Result Value Ref Range Status   MRSA by PCR NEGATIVE NEGATIVE Final    Comment:        The GeneXpert MRSA Assay (FDA approved for NASAL specimens only), is one component of a comprehensive MRSA colonization surveillance program. It is not intended to diagnose MRSA infection nor to guide or monitor treatment  for MRSA infections. Performed at [facility], [place] [address], [place], [place] [address]     Procedures and diagnostic studies:  DG Chest 2 View  Result Date: 09/20/2020 CLINICAL DATA:  Chest pain and syncope EXAM: CHEST - 2 VIEW COMPARISON:  August 26, 2020  FINDINGS: There is airspace consolidation in portions of the right middle and lower lobes consistent with pneumonia. There is atelectatic change in the left base. Heart size and pulmonary vascularity are normal. No adenopathy. There is degenerative change in the thoracic spine. IMPRESSION: Airspace consolidation consistent with a degree of pneumonia in portions of the right middle lobe and right lower lobe regions. Atelectasis left base. Heart size within normal limits. No adenopathy evident. Electronically Signed   By: Lowella Grip III M.D.   On: 09/20/2020 11:52   CT Head Wo Contrast  Result Date: 09/20/2020 CLINICAL DATA:  Syncope. EXAM: CT HEAD WITHOUT CONTRAST TECHNIQUE: Contiguous axial images were obtained from the base of the skull through the vertex without intravenous contrast. COMPARISON:  None. FINDINGS: Brain: No evidence of acute infarction, hemorrhage, hydrocephalus, extra-axial collection or mass lesion/mass effect. Mild patchy white matter hypodensities, most likely related to chronic microvascular ischemic disease. Vascular: Calcific atherosclerosis. Skull: No acute fracture. Sinuses/Orbits: Mild scattered paranasal sinus mucosal thickening. Retention cyst in bilateral maxillary sinuses. Unremarkable orbits. Other: Small right mastoid effusion. IMPRESSION: No evidence of acute intracranial abnormality. Electronically Signed   By: Margaretha Sheffield MD   On: 09/20/2020 18:07   CT Angio Chest PE W/Cm &/Or Wo Cm  Result Date: 09/20/2020 CLINICAL DATA:  Syncope, chest pain, rule out PE EXAM: CT ANGIOGRAPHY CHEST WITH CONTRAST TECHNIQUE: Multidetector CT imaging of the chest was performed using the standard protocol during bolus administration of intravenous contrast. Multiplanar CT image reconstructions and MIPs were obtained to evaluate the vascular anatomy. CONTRAST:  28mL OMNIPAQUE IOHEXOL 350 MG/ML SOLN COMPARISON:  07/30/2020 FINDINGS: Cardiovascular: Satisfactory opacification of  the pulmonary arteries to the segmental level. No evidence of pulmonary embolism. Normal heart size. LAD coronary artery calcifications and stents. No pericardial effusion. Mediastinum/Nodes: No enlarged mediastinal, hilar, or axillary lymph nodes. Thyroid gland, trachea, and esophagus demonstrate no significant findings. Lungs/Pleura: Diffuse bilateral bronchial wall thickening. Frothy debris in the airways of the bilateral lower lobes dependent bibasilar atelectasis or consolidation. No pleural effusion or pneumothorax. Upper Abdomen: No acute abnormality. Musculoskeletal: No chest wall abnormality. No acute or significant osseous findings. Review of the MIP images confirms the above findings. IMPRESSION: 1. Negative examination for pulmonary embolism. 2. Diffuse bilateral bronchial wall thickening with frothy debris in the airways of the bilateral lower lobes, findings consistent with nonspecific infectious or inflammatory bronchitis, possibly including a component of aspiration. 3. Dependent bibasilar atelectasis or consolidation. 4. Coronary artery disease. Electronically Signed   By: Eddie Candle M.D.   On: 09/20/2020 17:58   CARDIAC CATHETERIZATION  Result Date: 09/21/2020  Prox LAD to Mid LAD lesion is 15% stenosed.  Prox LAD lesion is 40% stenosed.  Mid LAD lesion is 30% stenosed.  Prox RCA to Mid RCA lesion is 100% stenosed.  LV end diastolic pressure is moderately elevated.  1. 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 2. Patent stent in the LAD 3. Moderately elevated LVEDP 25 mm Hg Plan: continue medical therapy.   ECHOCARDIOGRAM COMPLETE  Result Date: 09/21/2020    ECHOCARDIOGRAM REPORT   Patient Name:   Todd [name] Date of Exam: 09/21/2020 Medical  Rec #:  [id number]     Height:       67.0 in Accession #:    [id number]    Weight:       211.0 lb Date of Birth:  [birth year]     BSA:          2.069 m Patient Age:    27 years      BP:           147/95  mmHg Patient Gender: M             HR:           65 bpm. Exam Location:  Inpatient Procedure: 2D Echo Indications:    780.2 syncope  History:        Patient has no prior history of Echocardiogram examinations. CAD                 and Previous Myocardial Infarction; Risk Factors:Diabetes,                 Dyslipidemia and Current Smoker. Tobacco abuse.  Sonographer:    Jannett Celestine RDCS (AE) Referring Phys: [id number] VISHAL R PATEL IMPRESSIONS  1. Left ventricular ejection fraction, by estimation, is 60 to 65%. The left ventricle has normal function. The left ventricle has no regional wall motion abnormalities. There is mild left ventricular hypertrophy. Left ventricular diastolic parameters were normal.  2. Right ventricular systolic function is normal. The right ventricular size is normal.  3. Left atrial size was mildly dilated.  4. The mitral valve is normal in structure. Trivial mitral valve regurgitation. No evidence of mitral stenosis.  5. The aortic valve is tricuspid. Aortic valve regurgitation is not visualized. Mild to moderate aortic valve sclerosis/calcification is present, without any evidence of aortic stenosis.  6. Aortic dilatation noted. There is mild dilatation of the ascending aorta, measuring 39 mm.  7. The inferior vena cava is normal in size with greater than 50% respiratory variability, suggesting right atrial pressure of 3 mmHg. FINDINGS  Left Ventricle: Left ventricular ejection fraction, by estimation, is 60 to 65%. The left ventricle has normal function. The left ventricle has no regional wall motion abnormalities. The left ventricular internal cavity size was normal in size. There is  mild left ventricular hypertrophy. Left ventricular diastolic parameters were normal. Right Ventricle: The right ventricular size is normal. No increase in right ventricular wall thickness. Right ventricular systolic function is normal. Left Atrium: Left atrial size was mildly dilated. Right Atrium: Right  atrial size was normal in size. Pericardium: There is no evidence of pericardial effusion. Mitral Valve: The mitral valve is normal in structure. Trivial mitral valve regurgitation. No evidence of mitral valve stenosis. Tricuspid Valve: The tricuspid valve is normal in structure. Tricuspid valve regurgitation is not demonstrated. No evidence of tricuspid stenosis. Aortic Valve: The aortic valve is tricuspid. Aortic valve regurgitation is not visualized. Mild to moderate aortic valve sclerosis/calcification is present, without any evidence of aortic stenosis. Pulmonic Valve: The pulmonic valve was normal in structure. Pulmonic valve regurgitation is not visualized. No evidence of pulmonic stenosis. Aorta: The aortic root is normal in size and structure and aortic dilatation noted. There is mild dilatation of the ascending aorta, measuring 39 mm. Venous: The inferior vena cava is normal in size with greater than 50% respiratory variability, suggesting right atrial pressure of 3 mmHg. IAS/Shunts: No atrial level shunt detected by color flow Doppler.  LEFT VENTRICLE PLAX 2D LVIDd:  4.30 cm  Diastology LVIDs:         3.20 cm  LV e' medial:    7.83 cm/s LV PW:         1.30 cm  LV E/e' medial:  14.4 LV IVS:        1.40 cm  LV e' lateral:   10.00 cm/s LVOT diam:     2.50 cm  LV E/e' lateral: 11.3 LV SV:         171 LV SV Index:   83 LVOT Area:     4.91 cm  RIGHT VENTRICLE RV S prime:     11.70 cm/s TAPSE (M-mode): 2.4 cm LEFT ATRIUM             Index       RIGHT ATRIUM           Index LA diam:        4.10 cm 1.98 cm/m  RA Area:     14.30 cm LA Vol (A2C):   57.8 ml 27.94 ml/m RA Volume:   31.00 ml  14.98 ml/m LA Vol (A4C):   39.7 ml 19.19 ml/m LA Biplane Vol: 48.7 ml 23.54 ml/m  AORTIC VALVE LVOT Vmax:   135.00 cm/s LVOT Vmean:  94.200 cm/s LVOT VTI:    0.349 m  AORTA Ao Root diam: 3.20 cm MITRAL VALVE MV Area (PHT): 3.27 cm     SHUNTS MV Decel Time: 232 msec     Systemic VTI:  0.35 m MV E velocity: 113.00  cm/s  Systemic Diam: 2.50 cm MV A velocity: 59.10 cm/s MV E/A ratio:  1.91 Jenkins Rouge MD Electronically signed by Jenkins Rouge MD Signature Date/Time: 09/21/2020/12:34:20 PM    Final     Medications:    aspirin EC  81 mg Oral Daily   atorvastatin  80 mg Oral QHS   chlorhexidine  60 mL Topical Once   Chlorhexidine Gluconate Cloth  6 each Topical Daily   fluticasone furoate-vilanterol  1 puff Inhalation q AM   gentamicin irrigation  80 mg Irrigation On Call   insulin aspart  0-5 Units Subcutaneous QHS   insulin aspart  0-9 Units Subcutaneous TID WC   losartan  50 mg Oral Daily   mouth rinse  15 mL Mouth Rinse BID   mupirocin ointment  1 application Nasal BID   nicotine  21 mg Transdermal Daily   pantoprazole  40 mg Oral QHS   sodium chloride flush  3 mL Intravenous Q12H   umeclidinium bromide  1 puff Inhalation Daily   Continuous Infusions:  sodium chloride 50 mL/hr at 09/22/20 0944   sodium chloride 50 mL/hr at 09/22/20 0939   vancomycin       LOS: 0 days   Geradine Girt  [facility]   How to contact the [facility] Attending or Consulting provider [place] or covering provider during after hours 7P -7A, for this patient?  1. Check the care team in [facility] and look for a) attending/consulting [facility] provider listed and b) the [facility] team listed 2. Log into [email or web address] and use [place]'s universal password to access. If you do not have the password, please contact the hospital operator. 3. Locate the [facility] provider you are looking for under [facility] and page to a number that you can be directly reached. 4. If you still have difficulty reaching the provider, please page the [facility] (Director on Call) for the Hospitalists listed on amion for assistance.  09/22/2020,  9:53 AM
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
## 2020-09-22 NOTE — Plan of Care (Signed)
  Problem: Cardiovascular: Goal: Vascular access site(s) Level 0-1 will be maintained Outcome: Progressing   Problem: Education: Goal: Knowledge of General Education information will improve Description: Including pain rating scale, medication(s)/side effects and non-pharmacologic comfort measures Outcome: Not Progressing
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
## 2020-09-22 NOTE — Interval H&P Note (Signed)
History and Physical Interval Note:  09/22/2020 2:43 PM  Todd [name]  has presented today for surgery, with the diagnosis of snd.  The various methods of treatment have been discussed with the patient and family. After consideration of risks, benefits and other options for treatment, the patient has consented to  Procedure(s): PACEMAKER IMPLANT (N/A) as a surgical intervention.  The patient's history has been reviewed, patient examined, no change in status, stable for surgery.  I have reviewed the patient's chart and labs.  Questions were answered to the patient's satisfaction.     Robin Pafford [employer]

## 2020-09-23 ENCOUNTER — Encounter (HOSPITAL_COMMUNITY): Payer: Self-pay | Admitting: Cardiology

## 2020-09-23 ENCOUNTER — Inpatient Hospital Stay (HOSPITAL_COMMUNITY): Payer: Medicare Other

## 2020-09-23 DIAGNOSIS — I495 Sick sinus syndrome: Secondary | ICD-10-CM | POA: Diagnosis not present

## 2020-09-23 DIAGNOSIS — I2510 Atherosclerotic heart disease of native coronary artery without angina pectoris: Secondary | ICD-10-CM | POA: Diagnosis not present

## 2020-09-23 LAB — BASIC METABOLIC PANEL
Anion gap: 7 (ref 5–15)
BUN: 9 mg/dL (ref 8–23)
CO2: 30 mmol/L (ref 22–32)
Calcium: 8.4 mg/dL — ABNORMAL LOW (ref 8.9–10.3)
Chloride: 99 mmol/L (ref 98–111)
Creatinine, Ser: 0.79 mg/dL (ref 0.61–1.24)
GFR, Estimated: >60 mL/min (ref >60)
Glucose, Bld: 213 mg/dL — ABNORMAL HIGH (ref 70–99)
Potassium: 3.8 mmol/L (ref 3.5–5.1)
Sodium: 136 mmol/L (ref 135–145)

## 2020-09-23 LAB — CBC
HCT: 42.8 % (ref 39.0–52.0)
Hemoglobin: 13.9 g/dL (ref 13.0–17.0)
MCH: 30.2 pg (ref 26.0–34.0)
MCHC: 32.5 g/dL (ref 30.0–36.0)
MCV: 93 fL (ref 80.0–100.0)
Platelets: 169 10*3/uL (ref 150–400)
RBC: 4.6 MIL/uL (ref 4.22–5.81)
RDW: 12.9 % (ref 11.5–15.5)
WBC: 8.7 10*3/uL (ref 4.0–10.5)
nRBC: 0 % (ref 0.0–0.2)

## 2020-09-23 LAB — GLUCOSE, CAPILLARY
Glucose-Capillary: 149 mg/dL — ABNORMAL HIGH (ref 70–99)
Glucose-Capillary: 199 mg/dL — ABNORMAL HIGH (ref 70–99)

## 2020-09-23 LAB — MAGNESIUM: Magnesium: 2 mg/dL (ref 1.7–2.4)

## 2020-09-23 MED ORDER — METFORMIN HCL 500 MG PO TABS: 1000.0000 mg | ORAL_TABLET | Freq: Two times a day (BID) | ORAL | Status: DC

## 2020-09-23 MED ORDER — PANTOPRAZOLE SODIUM 40 MG PO TBEC: 40.0000 mg | DELAYED_RELEASE_TABLET | Freq: Every day | ORAL | 0 refills | Status: DC

## 2020-09-23 MED ORDER — NICOTINE 21 MG/24HR TD PT24: 21.0000 mg | MEDICATED_PATCH | Freq: Every day | TRANSDERMAL | 0 refills | Status: DC

## 2020-09-23 MED FILL — Gentamicin Sulfate Inj 40 MG/ML: INTRAMUSCULAR | Qty: 80 | Status: AC

## 2020-09-23 MED FILL — Vancomycin HCl-Dextrose IV Soln 1 GM/200ML-5%: INTRAVENOUS | Qty: 200 | Status: AC

## 2020-09-23 MED FILL — Lidocaine HCl Local Inj 1%: INTRAMUSCULAR | Qty: 20 | Status: AC

## 2020-09-23 NOTE — Progress Notes (Signed)
Physical Therapy Treatment Patient Details Name: Todd [name] MRN: [id number] DOB: 12/19/1960 Today's Date: 09/23/2020    History of Present Illness Pt is a 60 y/o male admitted secondary to syncopal episodes. Workup pending.  Pacemaker implanted 11/17.   PMH includes HTN, CAD, DM, COPD, tobacco use, and R TKA.     PT Comments    Pt admitted with above diagnosis. Pt was able to ambulate with cane with min assist with unsteady gait and bil knee pain.  Pt requested to use the RW and did much better with this device and can use his RW at home.  Pt did desat and needed 2LO2 to maintain sats >90%.  Dr. Eliseo Squires on unit and aware.  Pt currently with functional limitations due to balance and endurance deficits. Pt will benefit from skilled PT to increase their independence and safety with mobility to allow discharge to the venue listed below.     Follow Up Recommendations  Home health PT     Equipment Recommendations  Other (comment) (home O2)    Recommendations for Other Services       Precautions / Restrictions Precautions:  Pacemaker precautions Precautions: Other (comment) Precaution Comments: Watch O2 Restrictions Weight Bearing Restrictions: No    Mobility  Bed Mobility               General bed mobility comments: sitting in chair on arrival  Transfers Overall transfer level: Needs assistance Equipment used: Straight cane Transfers: Sit to/from Stand Sit to Stand: Min guard         General transfer comment: Min guard for safety   Ambulation/Gait Ambulation/Gait assistance: Min guard;Min assist Gait Distance (Feet): 250 Feet Assistive device: Straight cane;Rolling walker (2 wheeled) Gait Pattern/deviations: Step-through pattern;Decreased stride length;Trunk flexed;Staggering right;Drifts right/left Gait velocity: Decreased Gait velocity interpretation: <1.31 ft/sec, indicative of household ambulator General Gait Details: Guarded gait with mild unsteadiness noted  with cane.  Pt got to a point about 100 feet that he said he felt too unsteady to continue with cane, therefore obtained RW and used that to ambulate rest of walk.   Performed gait training without oxygen initially however desat to 85% on RA.  Pt sats with 2LO2 incr to >90%.  Dr. Eliseo Squires on unit and was made aware of O2 needs with ambulation and with talking as well as needing at night per daughter.    Stairs             Wheelchair Mobility    Modified Rankin (Stroke Patients Only)       Balance Overall balance assessment: Needs assistance Sitting-balance support: No upper extremity supported;Feet supported Sitting balance-Leahy Scale: Good     Standing balance support: Single extremity supported;During functional activity;Bilateral upper extremity supported Standing balance-Leahy Scale: Poor Standing balance comment: relies on at least 1 UE support for balance.                             Cognition Arousal/Alertness: Awake/alert Behavior During Therapy: WFL for tasks assessed/performed Overall Cognitive Status: Within Functional Limits for tasks assessed                                        Exercises      General Comments General comments (skin integrity, edema, etc.): Pts daughter present during session      Pertinent Vitals/Pain Pain  Assessment: Faces Faces Pain Scale: Hurts even more Pain Location: knees Pain Descriptors / Indicators: Grimacing;Guarding Pain Intervention(s): Limited activity within patient's tolerance;Monitored during session;Repositioned    Home Living                      Prior Function            PT Goals (current goals can now be found in the care plan section) Acute Rehab PT Goals Patient Stated Goal: to go home Progress towards PT goals: Progressing toward goals    Frequency    Min 3X/week      PT Plan Discharge plan needs to be updated;Equipment recommendations need to be updated     Co-evaluation              AM-PAC PT "6 Clicks" Mobility   Outcome Measure  Help needed turning from your back to your side while in a flat bed without using bedrails?: None Help needed moving from lying on your back to sitting on the side of a flat bed without using bedrails?: None Help needed moving to and from a bed to a chair (including a wheelchair)?: A Little Help needed standing up from a chair using your arms (e.g., wheelchair or bedside chair)?: A Little Help needed to walk in hospital room?: A Little Help needed climbing 3-5 steps with a railing? : A Little 6 Click Score: 20    End of Session Equipment Utilized During Treatment: Gait belt;Oxygen Activity Tolerance: Patient tolerated treatment well Patient left: with call bell/phone within reach;in chair;with family/visitor present Nurse Communication: Mobility status PT Visit Diagnosis: Other abnormalities of gait and mobility (R26.89);Muscle weakness (generalized) (M62.81)     Time: 6789-3810 PT Time Calculation (min) (ACUTE ONLY): 25 min  Charges:  $Gait Training: 23-37 mins                     Calvina Liptak W,PT [place] Pager:  [phone]  Office:  [place] 09/23/2020, 10:26 AM

## 2020-09-23 NOTE — Progress Notes (Signed)
SATURATION QUALIFICATIONS: (This note is used to comply with regulatory documentation for home oxygen)  Patient Saturations on Room Air at Rest = 89%  Patient Saturations on Room Air while Ambulating = 85%  Patient Saturations on 2 Liters of oxygen while Ambulating = 91%  Please briefly explain why patient needs home oxygen:Pt desats on RA at rest and with ambulation. Needed O2 to maintain sats >90%.  Kyrin Garn W,PT Acute Rehabilitation Services Pager:  [phone]  Office:  [phone]

## 2020-09-23 NOTE — Progress Notes (Addendum)
Electrophysiology Rounding Note  Patient Name: Todd [name] Date of Encounter: 09/23/2020  Primary Cardiologist: Dr. Martinique Electrophysiologist: Dr. Curt Bears  Subjective   The patient is doing well today.  At this time, the patient denies chest pain, shortness of breath, or any new concerns. Slightly sore s/p PPM  S/p DDD MDT PPM 09/22/2020. Site stable. CXR PA appears stable. Lateral view is obstructed by arm. Stable interrogation so no repeat CXR deemed necessary at this time.  Formal read pending.  Inpatient Medications    Scheduled Meds: . aspirin EC  81 mg Oral Daily  . atorvastatin  80 mg Oral QHS  . Chlorhexidine Gluconate Cloth  6 each Topical Daily  . fluticasone furoate-vilanterol  1 puff Inhalation q AM  . insulin aspart  0-5 Units Subcutaneous QHS  . insulin aspart  0-9 Units Subcutaneous TID WC  . losartan  50 mg Oral Daily  . mouth rinse  15 mL Mouth Rinse BID  . nicotine  21 mg Transdermal Daily  . pantoprazole  40 mg Oral QHS  . sodium chloride flush  3 mL Intravenous Q12H  . umeclidinium bromide  1 puff Inhalation Daily   Continuous Infusions:  PRN Meds: acetaminophen **OR** acetaminophen, albuterol, diazepam, morphine injection, ondansetron **OR** ondansetron (ZOFRAN) IV, ondansetron (ZOFRAN) IV   Vital Signs    Vitals:   09/23/20 0300 09/23/20 0400 09/23/20 0500 09/23/20 0645  BP:  131/63    Pulse: 70 (!) 59 (!) 59   Resp: 18 13 11    Temp:    97.8 F (36.6 C)  TempSrc:    Oral  SpO2: 93% 95% 96%   Weight:      Height:        Intake/Output Summary (Last 24 hours) at 09/23/2020 0703 Last data filed at 09/23/2020 0300 Gross per 24 hour  Intake 1082.14 ml  Output 2875 ml  Net -1792.86 ml   Filed Weights   09/21/20 1700 09/22/20 0600  Weight: 94.9 kg 95.7 kg    Physical Exam    GEN- The patient is well appearing, alert and oriented x 3 today.   Head- normocephalic, atraumatic Eyes-  Sclera clear, conjunctiva pink Ears- hearing  intact Oropharynx- clear Neck- supple Lungs- Clear to ausculation bilaterally, normal work of breathing Heart- Regular rate and rhythm, no murmurs, rubs or gallops GI- soft, NT, ND, + BS Extremities- no clubbing or cyanosis. No edema Skin- no rash or lesion Psych- euthymic mood, full affect Neuro- strength and sensation are intact  Labs    CBC Recent Labs    09/20/20 1131 09/21/20 0257 09/22/20 0312 09/23/20 0042  WBC 8.5  8.4   < > 9.2 8.7  NEUTROABS 6.1  --   --   --   HGB 15.0  15.2   < > 14.2 13.9  HCT 47.3  46.9   < > 43.6 42.8  MCV 94.4  94.6   < > 93.4 93.0  PLT 193  198   < > 170 169   < > = values in this interval not displayed.   Basic Metabolic Panel Recent Labs    09/21/20 0257 09/21/20 0257 09/22/20 0312 09/23/20 0042  NA 135   < > 136 136  K 3.5   < > 3.8 3.8  CL 95*   < > 97* 99  CO2 31   < > 31 30  GLUCOSE 259*   < > 151* 213*  BUN 7*   < > 5* 9  CREATININE 0.73   < > 0.63 0.79  CALCIUM 8.6*   < > 8.7* 8.4*  MG 1.8  --   --  2.0   < > = values in this interval not displayed.   Liver Function Tests Recent Labs    09/20/20 1509  AST 27  ALT 31  ALKPHOS 75  BILITOT 0.6  PROT 6.9  ALBUMIN 3.9   No results for input(s): LIPASE, AMYLASE in the last 72 hours. Cardiac Enzymes No results for input(s): CKTOTAL, CKMB, CKMBINDEX, TROPONINI in the last 72 hours.   Telemetry    NSR 60-70s, A pacing in 60s at times (personally reviewed)  Radiology    CARDIAC CATHETERIZATION  Result Date: 09/21/2020  Prox LAD to Mid LAD lesion is 15% stenosed.  Prox LAD lesion is 40% stenosed.  Mid LAD lesion is 30% stenosed.  Prox RCA to Mid RCA lesion is 100% stenosed.  LV end diastolic pressure is moderately elevated.  1. 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 2. Patent stent in the LAD 3. Moderately elevated LVEDP 25 mm Hg Plan: continue medical therapy.   EP PPM/ICD IMPLANT  Result Date:  09/22/2020 SURGEON:  Dyer Klug Meredith Leeds, MD   PREPROCEDURE DIAGNOSIS:  Sinus arrest, syncope   POSTPROCEDURE DIAGNOSIS:  Sinus arrest, syncope    PROCEDURES:  1. Pacemaker implantation.   INTRODUCTION: Todd [name] is a 59 y.o. male  with a history of bradycardia who presents today for pacemaker implantation.  The patient reports intermittent episodes of syncope over the past few months.  No reversible causes have been identified.  The patient therefore presents today for pacemaker implantation.   DESCRIPTION OF PROCEDURE:  Informed written consent was obtained, and  the patient was brought to the electrophysiology lab in a fasting state.  The patient required no sedation for the procedure today.  The patients left chest was prepped and draped in the usual sterile fashion by the EP lab staff. The skin overlying the left deltopectoral region was infiltrated with lidocaine for local analgesia.  A 4-cm incision was made over the left deltopectoral region.  A left subcutaneous pacemaker pocket was fashioned using a combination of sharp and blunt dissection. Electrocautery was required to assure hemostasis.  RA/RV Lead Placement: The left axillary vein was cannulated.  Through the left axillary vein, a Medtronic model 5076 (serial number [id number]) right atrial lead and a Medtronic model 5076 (serial number [id number]) right ventricular lead were advanced with fluoroscopic visualization into the right atrial appendage and right ventricular apex positions respectively.  Initial atrial lead P- waves measured 3.62mV with impedance of 691 ohms and a threshold of 0.9 V at 0.5 msec.  Right ventricular lead R-waves measured 6.1 mV with an impedance of 1767 ohms and a threshold of 1 V at 0.5 msec.  Both leads were secured to the pectoralis fascia using #2-0 silk over the suture sleeves. Device Placement:  The leads were then connected to a Medtronic Azure XT DR MRI SureScan (serial number [id number]) pacemaker.  The  pocket was irrigated with copious gentamicin solution.  The pacemaker was then placed into the pocket.  The pocket was then closed in 3 layers with 2.0 Vicryl suture for the subcutaneous and 3.0 Vicryl suture subcuticular layers.  Steri-Strips and a sterile dressing were then applied. EBL<71ml. There were no early apparent complications.   CONCLUSIONS:  1. Successful implantation of a Medtronic Azure XT DR  MRI SureScan dual-chamber pacemaker for symptomatic bradycardia  2. No early apparent complications.       Khristi Schiller Meredith Leeds, MD 09/22/2020 4:24 PM  ECHOCARDIOGRAM COMPLETE  Result Date: 09/21/2020    ECHOCARDIOGRAM REPORT   Patient Name:   Todd [name] Date of Exam: 09/21/2020 Medical Rec #:  [id number]     Height:       67.0 in Accession #:    [id number]    Weight:       211.0 lb Date of Birth:  [birth year]     BSA:          2.069 m Patient Age:    2 years      BP:           147/95 mmHg Patient Gender: M             HR:           65 bpm. Exam Location:  Inpatient Procedure: 2D Echo Indications:    780.2 syncope  History:        Patient has no prior history of Echocardiogram examinations. CAD                 and Previous Myocardial Infarction; Risk Factors:Diabetes,                 Dyslipidemia and Current Smoker. Tobacco abuse.  Sonographer:    Jannett Celestine RDCS (AE) Referring Phys: [id number] VISHAL R PATEL IMPRESSIONS  1. Left ventricular ejection fraction, by estimation, is 60 to 65%. The left ventricle has normal function. The left ventricle has no regional wall motion abnormalities. There is mild left ventricular hypertrophy. Left ventricular diastolic parameters were normal.  2. Right ventricular systolic function is normal. The right ventricular size is normal.  3. Left atrial size was mildly dilated.  4. The mitral valve is normal in structure. Trivial mitral valve regurgitation. No evidence of mitral stenosis.  5. The aortic valve is tricuspid. Aortic valve regurgitation is not visualized. Mild to  moderate aortic valve sclerosis/calcification is present, without any evidence of aortic stenosis.  6. Aortic dilatation noted. There is mild dilatation of the ascending aorta, measuring 39 mm.  7. The inferior vena cava is normal in size with greater than 50% respiratory variability, suggesting right atrial pressure of 3 mmHg. FINDINGS  Left Ventricle: Left ventricular ejection fraction, by estimation, is 60 to 65%. The left ventricle has normal function. The left ventricle has no regional wall motion abnormalities. The left ventricular internal cavity size was normal in size. There is  mild left ventricular hypertrophy. Left ventricular diastolic parameters were normal. Right Ventricle: The right ventricular size is normal. No increase in right ventricular wall thickness. Right ventricular systolic function is normal. Left Atrium: Left atrial size was mildly dilated. Right Atrium: Right atrial size was normal in size. Pericardium: There is no evidence of pericardial effusion. Mitral Valve: The mitral valve is normal in structure. Trivial mitral valve regurgitation. No evidence of mitral valve stenosis. Tricuspid Valve: The tricuspid valve is normal in structure. Tricuspid valve regurgitation is not demonstrated. No evidence of tricuspid stenosis. Aortic Valve: The aortic valve is tricuspid. Aortic valve regurgitation is not visualized. Mild to moderate aortic valve sclerosis/calcification is present, without any evidence of aortic stenosis. Pulmonic Valve: The pulmonic valve was normal in structure. Pulmonic valve regurgitation is not visualized. No evidence of pulmonic stenosis. Aorta: The aortic root is normal in size and structure and aortic dilatation noted. There is mild dilatation  of the ascending aorta, measuring 39 mm. Venous: The inferior vena cava is normal in size with greater than 50% respiratory variability, suggesting right atrial pressure of 3 mmHg. IAS/Shunts: No atrial level shunt detected by  color flow Doppler.  LEFT VENTRICLE PLAX 2D LVIDd:         4.30 cm  Diastology LVIDs:         3.20 cm  LV e' medial:    7.83 cm/s LV PW:         1.30 cm  LV E/e' medial:  14.4 LV IVS:        1.40 cm  LV e' lateral:   10.00 cm/s LVOT diam:     2.50 cm  LV E/e' lateral: 11.3 LV SV:         171 LV SV Index:   83 LVOT Area:     4.91 cm  RIGHT VENTRICLE RV S prime:     11.70 cm/s TAPSE (M-mode): 2.4 cm LEFT ATRIUM             Index       RIGHT ATRIUM           Index LA diam:        4.10 cm 1.98 cm/m  RA Area:     14.30 cm LA Vol (A2C):   57.8 ml 27.94 ml/m RA Volume:   31.00 ml  14.98 ml/m LA Vol (A4C):   39.7 ml 19.19 ml/m LA Biplane Vol: 48.7 ml 23.54 ml/m  AORTIC VALVE LVOT Vmax:   135.00 cm/s LVOT Vmean:  94.200 cm/s LVOT VTI:    0.349 m  AORTA Ao Root diam: 3.20 cm MITRAL VALVE MV Area (PHT): 3.27 cm     SHUNTS MV Decel Time: 232 msec     Systemic VTI:  0.35 m MV E velocity: 113.00 cm/s  Systemic Diam: 2.50 cm MV A velocity: 59.10 cm/s MV E/A ratio:  1.91 Jenkins Rouge MD Electronically signed by Jenkins Rouge MD Signature Date/Time: 09/21/2020/12:34:20 PM    Final     Patient Profile     Todd [name] is a 60 y.o. 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 Johnsie Cancel.  Assessment & Plan    1. Sinus arrest with syncope S/p Medtronic dual chamber PPM 09/22/2020 CXR PA appears stable. Lateral view obstructed. Stable device interrogation, no need to repeat at this time.  Wound care and arm restrictions reviewed with patient yesterday and today, as well as with wife yesterday.  Usual follow up and instructions placed in AVS.   2. CAD S/p cath yesterday with moderate diffuse disease and CTO of RCA Medical therapy per gen cards.  3. Acute hypoxic respiratory failure New O2 requirement. Per IM.  Suspect this has been an  underlying issue and incidental finding with > 40 pack years   Stable for discharge from EP perspective if O2 can be weaned vs arranged for home. EP to see as needed while here.   For questions or updates, please contact [place] Please consult [email or web address] for contact info under Cardiology/STEMI.  Signed, Shirley Friar, PA-C  09/23/2020, 7:03 AM   I have seen and examined this patient with Oda Kilts.  Agree with above, note added to reflect my findings.  On exam, RRR, no  murmurs.  He is now status post Medtronic dual chamber pacemaker for sinus arrest.  Device functioning appropriately.  Chest x-ray and interrogation without issue.  [place] for discharge today with follow-up in device clinic.  Mario Voong M. Guliana Weyandt MD 09/23/2020 10:04 AM

## 2020-09-23 NOTE — Discharge Summary (Signed)
Physician Discharge Summary  Todd [name]:[id number] DOB: May 07, 1961 DOA: 09/20/2020  PCP: [name], MD  Admit date: 09/20/2020 Discharge date: 09/23/2020  Admitted From: home Discharge disposition: home   Recommendations for Outpatient Follow-Up:   1. Smoking cessation 2. Sent home on O2-- may be wean to wean off 3. Incentive spirometry   Discharge Diagnosis:   Principal Problem:   Syncope Active Problems:   Hypertension associated with diabetes ([place])   CAD (coronary artery disease)   Diabetes mellitus type II, non insulin dependent (HCC)   Benign essential HTN   Collapse of right lung   COPD (chronic obstructive pulmonary disease) (HCC)   Hyperlipidemia associated with type 2 diabetes mellitus ([place])   Sinus arrest    Discharge Condition: Improved.  Diet recommendation: Low sodium, heart healthy.  Carbohydrate-modified. Wound care: None.  Code status: Full.   History of Present Illness:   Todd [name] is a 59 y.o. 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  Patient reports 2 syncopal episodes in the last 2 days.  First episode was on 11/14 when he was at the grocery store.  He says he went to the bathroom to urinate when he all of a sudden became flushed and felt hot.  Next he knows he was waking up on the floor.  He says he was drenched in sweats and felt very weak.  He did not feel confused when he awoke.  He did not lose control of his bowel/bladder.  He says he has not had any chest pain, palpitations, dyspnea, nausea, vomiting, or abdominal pain.  He says did not injure himself from the fall.  He called his friends who help him return home.  Patient states at home he took a Valium around 3 AM morning of 11/15 before returning to sleep.  This morning he was  sitting on the edge of the bed preparing to go to the bathroom when he suddenly fell to the ground.  He does not recall any lightheadedness/dizziness, nausea, vomiting, chest pain, palpitations, diaphoresis prior to this episode or after awakening.  He says his family found him on the ground unresponsive and were trying to wake him for approximately 10 minutes before regaining consciousness.  He again did not have any bowel or bladder incontinence.  He did not feel confused on awakening.  EMS were called and he was later seen in his PCPs office and was subsequently sent to the ED for further evaluation.  Patient denies any similar episodes in the past.  He says at the time of his previous heart attack he was having severe chest pressure which has not occurred during these episodes.  He has been recently following with pulmonology for evaluation of a persistent right middle lobe consolidation.  He underwent bronchoscopy 08/26/2020 which showed right lung collapse with scattered multiple endobronchial lesions, proximal occlusion of right middle lobe airway.  Pathology showed benign tissue.  Patient has not required oxygen at home.     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  Acute respiratory failure -being sent home on 2L O2 -dropped to <88 while ambulating    Medical Consultants:   Cards/EP   Discharge Exam:   Vitals:   09/23/20 0800 09/23/20 0840  BP:  136/84  Pulse: 64 66  Resp:  (!) 29  Temp: 98.4 F (36.9 C)   SpO2: 100% 93%   Vitals:    09/23/20 0645 09/23/20 0700 09/23/20 0800 09/23/20 0840  BP:    136/84  Pulse:  64 64 66  Resp:  20  (!) 29  Temp: 97.8 F (36.6 C)  98.4 F (36.9 C)   TempSrc: Oral  Oral   SpO2:  100% 100% 93%  Weight:      Height:        General exam: Appears calm and comfortable.   The results of significant diagnostics from this hospitalization (including imaging, microbiology, ancillary and laboratory) are listed below for reference.     Procedures and Diagnostic Studies:   DG Chest 2 View  Result Date: 09/20/2020 CLINICAL DATA:  Chest pain and syncope EXAM: CHEST - 2 VIEW COMPARISON:  August 26, 2020 FINDINGS: There is airspace consolidation in portions of the right middle and lower lobes consistent with pneumonia. There is atelectatic change in the left base. Heart size and pulmonary vascularity are normal. No adenopathy. There is degenerative change in the thoracic spine. IMPRESSION: Airspace consolidation consistent with a degree of pneumonia in portions of the right middle lobe and right lower lobe regions. Atelectasis left base. Heart size within normal limits. No adenopathy evident. Electronically Signed   By: Lowella Grip III M.D.   On: 09/20/2020 11:52   CT Head Wo Contrast  Result Date: 09/20/2020 CLINICAL DATA:  Syncope. EXAM: CT HEAD WITHOUT CONTRAST TECHNIQUE: Contiguous axial images were obtained from the base of the skull through the vertex without intravenous contrast. COMPARISON:  None. FINDINGS: Brain: No evidence of acute infarction, hemorrhage, hydrocephalus, extra-axial collection or mass lesion/mass effect. Mild patchy white matter hypodensities, most likely related to chronic microvascular ischemic disease. Vascular: Calcific atherosclerosis. Skull: No acute fracture. Sinuses/Orbits: Mild scattered paranasal sinus mucosal thickening. Retention cyst in bilateral maxillary sinuses. Unremarkable orbits. Other: Small right mastoid effusion. IMPRESSION: No evidence of acute  intracranial abnormality. Electronically Signed   By: Margaretha Sheffield MD   On: 09/20/2020 18:07   CT Angio Chest PE W/Cm &/Or Wo Cm  Result Date: 09/20/2020 CLINICAL DATA:  Syncope, chest pain, rule out PE EXAM: CT ANGIOGRAPHY CHEST WITH CONTRAST TECHNIQUE: Multidetector CT imaging of the chest was performed using the standard protocol during bolus administration of intravenous contrast. Multiplanar CT image reconstructions and MIPs were obtained to evaluate the vascular anatomy. CONTRAST:  50m OMNIPAQUE IOHEXOL 350 MG/ML SOLN COMPARISON:  07/30/2020 FINDINGS: Cardiovascular: Satisfactory opacification of the pulmonary arteries to the segmental level. No evidence of pulmonary embolism. Normal heart size. LAD coronary artery calcifications and stents. No pericardial effusion. Mediastinum/Nodes: No enlarged mediastinal, hilar, or axillary lymph nodes. Thyroid gland, trachea, and esophagus demonstrate no significant findings. Lungs/Pleura: Diffuse bilateral bronchial wall thickening. Frothy debris in the airways of the bilateral lower lobes dependent bibasilar atelectasis or consolidation. No pleural effusion or pneumothorax. Upper Abdomen: No acute abnormality. Musculoskeletal: No chest wall abnormality. No acute or significant osseous findings. Review of the MIP images confirms the above findings. IMPRESSION: 1.  Negative examination for pulmonary embolism. 2. Diffuse bilateral bronchial wall thickening with frothy debris in the airways of the bilateral lower lobes, findings consistent with nonspecific infectious or inflammatory bronchitis, possibly including a component of aspiration. 3. Dependent bibasilar atelectasis or consolidation. 4. Coronary artery disease. Electronically Signed   By: Eddie Candle M.D.   On: 09/20/2020 17:58   CARDIAC CATHETERIZATION  Result Date: 09/21/2020  Prox LAD to Mid LAD lesion is 15% stenosed.  Prox LAD lesion is 40% stenosed.  Mid LAD lesion is 30% stenosed.  Prox  RCA to Mid RCA lesion is 100% stenosed.  LV end diastolic pressure is moderately elevated.  1. 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 2. Patent stent in the LAD 3. Moderately elevated LVEDP 25 mm Hg Plan: continue medical therapy.   ECHOCARDIOGRAM COMPLETE  Result Date: 09/21/2020    ECHOCARDIOGRAM REPORT   Patient Name:   RIAD WAGLEY Cooley Date of Exam: 09/21/2020 Medical Rec #:  [id number]     Height:       67.0 in Accession #:    [id number]    Weight:       211.0 lb Date of Birth:  Apr 03, 1961     BSA:          2.069 m Patient Age:    72 years      BP:           147/95 mmHg Patient Gender: M             HR:           65 bpm. Exam Location:  Inpatient Procedure: 2D Echo Indications:    780.2 syncope  History:        Patient has no prior history of Echocardiogram examinations. CAD                 and Previous Myocardial Infarction; Risk Factors:Diabetes,                 Dyslipidemia and Current Smoker. Tobacco abuse.  Sonographer:    Jannett Celestine RDCS (AE) Referring Phys: [id number] VISHAL R PATEL IMPRESSIONS  1. Left ventricular ejection fraction, by estimation, is 60 to 65%. The left ventricle has normal function. The left ventricle has no regional wall motion abnormalities. There is mild left ventricular hypertrophy. Left ventricular diastolic parameters were normal.  2. Right ventricular systolic function is normal. The right ventricular size is normal.  3. Left atrial size was mildly dilated.  4. The mitral valve is normal in structure. Trivial mitral valve regurgitation. No evidence of mitral stenosis.  5. The aortic valve is tricuspid. Aortic valve regurgitation is not visualized. Mild to moderate aortic valve sclerosis/calcification is present, without any evidence of aortic stenosis.  6. Aortic dilatation noted. There is mild dilatation of the ascending aorta, measuring 39 mm.  7. The inferior vena cava is normal in size with greater than 50% respiratory  variability, suggesting right atrial pressure of 3 mmHg. FINDINGS  Left Ventricle: Left ventricular ejection fraction, by estimation, is 60 to 65%. The left ventricle has normal function. The left ventricle has no regional wall motion abnormalities. The left ventricular internal cavity size was normal in size. There is  mild left ventricular hypertrophy. Left ventricular diastolic parameters were normal. Right Ventricle: The right ventricular size is normal. No increase in right ventricular wall thickness. Right ventricular systolic function is normal. Left Atrium: Left atrial size was mildly dilated. Right  Atrium: Right atrial size was normal in size. Pericardium: There is no evidence of pericardial effusion. Mitral Valve: The mitral valve is normal in structure. Trivial mitral valve regurgitation. No evidence of mitral valve stenosis. Tricuspid Valve: The tricuspid valve is normal in structure. Tricuspid valve regurgitation is not demonstrated. No evidence of tricuspid stenosis. Aortic Valve: The aortic valve is tricuspid. Aortic valve regurgitation is not visualized. Mild to moderate aortic valve sclerosis/calcification is present, without any evidence of aortic stenosis. Pulmonic Valve: The pulmonic valve was normal in structure. Pulmonic valve regurgitation is not visualized. No evidence of pulmonic stenosis. Aorta: The aortic root is normal in size and structure and aortic dilatation noted. There is mild dilatation of the ascending aorta, measuring 39 mm. Venous: The inferior vena cava is normal in size with greater than 50% respiratory variability, suggesting right atrial pressure of 3 mmHg. IAS/Shunts: No atrial level shunt detected by color flow Doppler.  LEFT VENTRICLE PLAX 2D LVIDd:         4.30 cm  Diastology LVIDs:         3.20 cm  LV e' medial:    7.83 cm/s LV PW:         1.30 cm  LV E/e' medial:  14.4 LV IVS:        1.40 cm  LV e' lateral:   10.00 cm/s LVOT diam:     2.50 cm  LV E/e' lateral: 11.3 LV  SV:         171 LV SV Index:   83 LVOT Area:     4.91 cm  RIGHT VENTRICLE RV S prime:     11.70 cm/s TAPSE (M-mode): 2.4 cm LEFT ATRIUM             Index       RIGHT ATRIUM           Index LA diam:        4.10 cm 1.98 cm/m  RA Area:     14.30 cm LA Vol (A2C):   57.8 ml 27.94 ml/m RA Volume:   31.00 ml  14.98 ml/m LA Vol (A4C):   39.7 ml 19.19 ml/m LA Biplane Vol: 48.7 ml 23.54 ml/m  AORTIC VALVE LVOT Vmax:   135.00 cm/s LVOT Vmean:  94.200 cm/s LVOT VTI:    0.349 m  AORTA Ao Root diam: 3.20 cm MITRAL VALVE MV Area (PHT): 3.27 cm     SHUNTS MV Decel Time: 232 msec     Systemic VTI:  0.35 m MV E velocity: 113.00 cm/s  Systemic Diam: 2.50 cm MV A velocity: 59.10 cm/s MV E/A ratio:  1.91 Jenkins Rouge MD Electronically signed by Jenkins Rouge MD Signature Date/Time: 09/21/2020/12:34:20 PM    Final      Labs:   Basic Metabolic Panel: Recent Labs  Lab 09/20/20 1131 09/20/20 1131 09/21/20 0257 09/21/20 0257 09/22/20 0312 09/23/20 0042  NA 134*  --  135  --  136 136  K 3.7   < > 3.5   < > 3.8 3.8  CL 92*  --  95*  --  97* 99  CO2 32  --  31  --  31 30  GLUCOSE 260*  --  259*  --  151* 213*  BUN 6*  --  7*  --  5* 9  CREATININE 0.69  --  0.73  --  0.63 0.79  CALCIUM 8.9  --  8.6*  --  8.7* 8.4*  MG  --   --  1.8  --   --  2.0   < > = values in this interval not displayed.   GFR Estimated Creatinine Clearance: 107.9 mL/min (by C-G formula based on SCr of 0.79 mg/dL). Liver Function Tests: Recent Labs  Lab 09/20/20 1509  AST 27  ALT 31  ALKPHOS 75  BILITOT 0.6  PROT 6.9  ALBUMIN 3.9   No results for input(s): LIPASE, AMYLASE in the last 168 hours. No results for input(s): AMMONIA in the last 168 hours. Coagulation profile No results for input(s): INR, PROTIME in the last 168 hours.  CBC: Recent Labs  Lab 09/20/20 1131 09/21/20 0257 09/22/20 0312 09/23/20 0042  WBC 8.5  8.4 7.6 9.2 8.7  NEUTROABS 6.1  --   --   --   HGB 15.0  15.2 13.5 14.2 13.9  HCT 47.3  46.9 42.0  43.6 42.8  MCV 94.4  94.6 95.7 93.4 93.0  PLT 193  198 170 170 169   Cardiac Enzymes: No results for input(s): CKTOTAL, CKMB, CKMBINDEX, TROPONINI in the last 168 hours. BNP: Invalid input(s): POCBNP CBG: Recent Labs  Lab 09/22/20 0638 09/22/20 1204 09/22/20 1649 09/22/20 2139 09/23/20 0647  GLUCAP 165* 153* 144* 194* 149*   D-Dimer No results for input(s): DDIMER in the last 72 hours. Hgb A1c Recent Labs    09/21/20 0257  HGBA1C 7.9*   Lipid Profile No results for input(s): CHOL, HDL, LDLCALC, TRIG, CHOLHDL, LDLDIRECT in the last 72 hours. Thyroid function studies No results for input(s): TSH, T4TOTAL, T3FREE, THYROIDAB in the last 72 hours.  Invalid input(s): FREET3 Anemia work up No results for input(s): VITAMINB12, FOLATE, FERRITIN, TIBC, IRON, RETICCTPCT in the last 72 hours. Microbiology Recent Results (from the past 240 hour(s))  Culture, blood (routine x 2)     Status: None (Preliminary result)   Collection Time: 09/20/20  3:14 PM   Specimen: BLOOD  Result Value Ref Range Status   Specimen Description BLOOD SITE NOT SPECIFIED  Final   Special Requests   Final    BOTTLES DRAWN AEROBIC AND ANAEROBIC Blood Culture adequate volume   Culture   Final    NO GROWTH 2 DAYS Performed at [facility], [address] [place], [place] [address]    Report Status PENDING  Incomplete  Culture, blood (routine x 2)     Status: None (Preliminary result)   Collection Time: 09/20/20  3:38 PM   Specimen: BLOOD  Result Value Ref Range Status   Specimen Description BLOOD RIGHT ANTECUBITAL  Final   Special Requests   Final    BOTTLES DRAWN AEROBIC AND ANAEROBIC Blood Culture results may not be optimal due to an excessive volume of blood received in culture bottles   Culture   Final    NO GROWTH 2 DAYS Performed at [facility], [place] [address] [place], [place] [address]    Report Status PENDING  Incomplete  Respiratory Panel by RT PCR (Flu A&B, Covid) -  Nasopharyngeal Swab     Status: None   Collection Time: 09/20/20  4:31 PM   Specimen: Nasopharyngeal Swab  Result Value Ref Range Status   SARS Coronavirus 2 by RT PCR NEGATIVE NEGATIVE Final    Comment: (NOTE) SARS-CoV-2 target nucleic acids are NOT DETECTED.  The SARS-CoV-2 RNA is generally detectable in upper respiratoy specimens during the acute phase of infection. The lowest concentration of SARS-CoV-2 viral copies this assay can detect is 131 copies/mL. A negative result does not preclude  SARS-Cov-2 infection and should not be used as the sole basis for treatment or other patient management decisions. A negative result may occur with  improper specimen collection/handling, submission of specimen other than nasopharyngeal swab, presence of viral mutation(s) within the areas targeted by this assay, and inadequate number of viral copies (<131 copies/mL). A negative result must be combined with clinical observations, patient history, and epidemiological information. The expected result is Negative.  Fact Sheet for Patients:  PinkCheek.be  Fact Sheet for Healthcare Providers:  GravelBags.it  This test is no t yet approved or cleared by the [place] FDA and  has been authorized for detection and/or diagnosis of SARS-CoV-2 by FDA under an Emergency Use Authorization (EUA). This EUA will remain  in effect (meaning this test can be used) for the duration of the COVID-19 declaration under Section 564(b)(1) of the Act, 21 U.S.C. section 360bbb-3(b)(1), unless the authorization is terminated or revoked sooner.     Influenza A by PCR NEGATIVE NEGATIVE Final   Influenza B by PCR NEGATIVE NEGATIVE Final    Comment: (NOTE) The Xpert Xpress SARS-CoV-2/FLU/RSV assay is intended as an aid in  the diagnosis of influenza from Nasopharyngeal swab specimens and  should not be used as a sole basis for treatment. Nasal washings and    aspirates are unacceptable for Xpert Xpress SARS-CoV-2/FLU/RSV  testing.  Fact Sheet for Patients: PinkCheek.be  Fact Sheet for Healthcare Providers: GravelBags.it  This test is not yet approved or cleared by the [place] FDA and  has been authorized for detection and/or diagnosis of SARS-CoV-2 by  FDA under an Emergency Use Authorization (EUA). This EUA will remain  in effect (meaning this test can be used) for the duration of the  Covid-19 declaration under Section 564(b)(1) of the Act, 21  U.S.C. section 360bbb-3(b)(1), unless the authorization is  terminated or revoked. Performed at [facility], [place] [address], [place], [place] [address]   MRSA PCR Screening     Status: None   Collection Time: 09/21/20  9:56 PM   Specimen: Nasal Mucosa; Nasopharyngeal  Result Value Ref Range Status   MRSA by PCR NEGATIVE NEGATIVE Final    Comment:        The GeneXpert MRSA Assay (FDA approved for NASAL specimens only), is one component of a comprehensive MRSA colonization surveillance program. It is not intended to diagnose MRSA infection nor to guide or monitor treatment for MRSA infections. Performed at [facility], [place] [address], [place], [place] [address]   Surgical PCR screen     Status: None   Collection Time: 09/22/20  9:30 AM   Specimen: Nasal Mucosa; Nasal Swab  Result Value Ref Range Status   MRSA, PCR NEGATIVE NEGATIVE Final   Staphylococcus aureus NEGATIVE NEGATIVE Final    Comment: (NOTE) The Xpert SA Assay (FDA approved for NASAL specimens in patients 18 years of age and older), is one component of a comprehensive surveillance program. It is not intended to diagnose infection nor to guide or monitor treatment. Performed at [facility], [place] [address] [place], [place] [address]      Discharge Instructions:   Discharge Instructions    Diet - low sodium heart healthy   Complete  by: As directed    Diet Carb Modified   Complete by: As directed    Increase activity slowly   Complete by: As directed      Allergies as of 09/23/2020      Reactions  Dust Mite Extract Anaphylaxis   Other Anaphylaxis, Swelling, Rash, Other (See Comments)   Grass and dog dander- sneeze and throat swells-- long-haired dogs Pollen- Anaphylaxis   Penicillins Hives   Aspirin Nausea Only, Other (See Comments)   Patient can tolerate 81 mg ONLY- stated his neck swells at any dose >81 mg- "looks like I have the measles"   Ibuprofen Nausea And Vomiting, Swelling, Other (See Comments)   Causes Severe headaches- "looks like I have the measles"   Latex Rash   Tape Rash      Medication List    STOP taking these medications   carvedilol 12.5 MG tablet Commonly known as: COREG   celecoxib 200 MG capsule Commonly known as: CELEBREX   losartan 50 MG tablet Commonly known as: COZAAR     TAKE these medications   albuterol 108 (90 Base) MCG/ACT inhaler Commonly known as: VENTOLIN HFA Inhale 2 puffs into the lungs every 6 (six) hours as needed for wheezing or shortness of breath.   aspirin EC 81 MG tablet Take 81 mg by mouth daily.   atorvastatin 80 MG tablet Commonly known as: LIPITOR TAKE 1 TABLET BY MOUTH DAILY IN THE EVENING AT 6:00PM What changed:   how much to take  how to take this  when to take this  additional instructions   blood glucose meter kit and supplies 1 each by Other route as directed. Dispense based on patient and insurance preference. Use up to four times daily as directed. (FOR ICD-10 E10.9, E11.9).   calcium carbonate 500 MG chewable tablet Commonly known as: TUMS - dosed in mg elemental calcium Chew 2 tablets by mouth as needed for indigestion or heartburn.   diazepam 5 MG tablet Commonly known as: VALIUM Take 1 tablet (5 mg total) by mouth every 8 (eight) hours as needed for anxiety. What changed:   when to take this  additional  instructions   EPINEPHrine 0.3 mg/0.3 mL Soaj injection Commonly known as: EpiPen 2-Pak Inject 0.3 mLs (0.3 mg total) into the muscle once as needed (for severe allergic reaction). CAll 911 immediately if you have to use this medicine What changed: reasons to take this   escitalopram 10 MG tablet Commonly known as: LEXAPRO Take 10 mg by mouth daily.   famotidine 20 MG tablet Commonly known as: PEPCID TAKE 1 TABLET BY MOUTH DAILY AS NEEDED FOR HEARTBURN OR INDIGESTION. What changed:   how much to take  how to take this  when to take this  reasons to take this  additional instructions   glipiZIDE 5 MG tablet Commonly known as: GLUCOTROL TAKE 1 TABLET BY MOUTH IN THE MORNING BEFORE BREAKFAST What changed: See the new instructions.   losartan-hydrochlorothiazide 50-12.5 MG tablet Commonly known as: HYZAAR Take 1 tablet by mouth daily.   metFORMIN 500 MG tablet Commonly known as: GLUCOPHAGE Take 2 tablets (1,000 mg total) by mouth 2 (two) times daily with a meal. Start taking on: September 25, 2020 What changed:   how much to take  how to take this  when to take this  additional instructions  These instructions start on September 25, 2020. If you are unsure what to do until then, ask your doctor or other care provider.   nicotine 21 mg/24hr patch Commonly known as: NICODERM CQ - dosed in mg/24 hours Place 1 patch (21 mg total) onto the skin daily.   nitroGLYCERIN 0.4 MG SL tablet Commonly known as: Nitrostat Place 1 tablet (0.4 mg total) under  the tongue every 5 (five) minutes as needed for chest pain.   pantoprazole 40 MG tablet Commonly known as: PROTONIX Take 1 tablet (40 mg total) by mouth daily. What changed: when to take this   Trelegy Ellipta 100-62.5-25 MCG/INH Aepb Generic drug: Fluticasone-Umeclidin-Vilant Inhale 1 Inhaler into the lungs every morning. What changed:   how much to take  when to take this  Another medication with the same name  was removed. Continue taking this medication, and follow the directions you see here.            Durable Medical Equipment  (From admission, onward)         Start     Ordered   09/23/20 0928  For home use only DME oxygen  Once       Question Answer Comment  Length of Need Lifetime   Mode or (Route) Nasal cannula   Liters per Minute 2   Oxygen delivery system Gas      09/23/20 0928          Follow-up Information    Constance Haw, MD Follow up on 12/28/2020.   Specialty: Cardiology Why: at 3 pm for 3 month pacemaker check Contact information: [address] [place] [address] [phone]        [facility] Follow up on 10/07/2020.   Why: at 0840 am for post pacemaker wound check. Contact information: [place] [address] [phone]       [name], MD Follow up in 1 week(s).   Specialty: Family Medicine Contact information: [address] [place] [address] [phone]                Time coordinating discharge: 35 min  Signed:  Geradine Girt DO  [facility] 09/23/2020, 9:32 AM

## 2020-09-23 NOTE — Progress Notes (Signed)
Subjective:  Denies SSCP, palpitations or Dyspnea He relies on wife / family a lot Seems to process info slowly  Objective:  Vitals:   09/23/20 0300 09/23/20 0400 09/23/20 0500 09/23/20 0645  BP:  131/63    Pulse: 70 (!) 59 (!) 59   Resp: 18 13 11    Temp:    97.8 F (36.6 C)  TempSrc:    Oral  SpO2: 93% 95% 96%   Weight:      Height:        Intake/Output from previous day:  Intake/Output Summary (Last 24 hours) at 09/23/2020 0815 Last data filed at 09/23/2020 0300 Gross per 24 hour  Intake 722.14 ml  Output 2625 ml  Net -1902.86 ml    Physical Exam: Lungs clear No bruit Previous pinning right wrist with no hematoma at right radial cath site Abdomen soft No edema   Lab Results: Basic Metabolic Panel: Recent Labs    09/21/20 0257 09/21/20 0257 09/22/20 0312 09/23/20 0042  NA 135   < > 136 136  K 3.5   < > 3.8 3.8  CL 95*   < > 97* 99  CO2 31   < > 31 30  GLUCOSE 259*   < > 151* 213*  BUN 7*   < > 5* 9  CREATININE 0.73   < > 0.63 0.79  CALCIUM 8.6*   < > 8.7* 8.4*  MG 1.8  --   --  2.0   < > = values in this interval not displayed.   Liver Function Tests: Recent Labs    09/20/20 1509  AST 27  ALT 31  ALKPHOS 75  BILITOT 0.6  PROT 6.9  ALBUMIN 3.9   No results for input(s): LIPASE, AMYLASE in the last 72 hours. CBC: Recent Labs    09/20/20 1131 09/21/20 0257 09/22/20 0312 09/23/20 0042  WBC 8.5  8.4   < > 9.2 8.7  NEUTROABS 6.1  --   --   --   HGB 15.0  15.2   < > 14.2 13.9  HCT 47.3  46.9   < > 43.6 42.8  MCV 94.4  94.6   < > 93.4 93.0  PLT 193  198   < > 170 169   < > = values in this interval not displayed.   Cardiac Enzymes: No results for input(s): CKTOTAL, CKMB, CKMBINDEX, TROPONINI in the last 72 hours. BNP: Invalid input(s): POCBNP D-Dimer: No results for input(s): DDIMER in the last 72 hours. Hemoglobin A1C: Recent Labs    09/21/20 0257  HGBA1C 7.9*   Fasting Lipid Panel: No results for input(s): CHOL, HDL,  LDLCALC, TRIG, CHOLHDL, LDLDIRECT in the last 72 hours. Thyroid Function Tests: No results for input(s): TSH, T4TOTAL, T3FREE, THYROIDAB in the last 72 hours.  Invalid input(s): FREET3 Anemia Panel: No results for input(s): VITAMINB12, FOLATE, FERRITIN, TIBC, IRON, RETICCTPCT in the last 72 hours.  Imaging: DG Chest 2 View  Result Date: 09/23/2020 CLINICAL DATA:  Cardiac device in situ. EXAM: CHEST - 2 VIEW COMPARISON:  CT 09/20/2020.  Chest x-ray 09/20/2020. FINDINGS: Cardiac pacer noted with lead tips over the right atrium right ventricle. Heart size normal. No pulmonary venous congestion. Low lung volumes with persistent bibasilar atelectasis. Tiny bilateral pleural effusions cannot be excluded. No pneumothorax. IMPRESSION: 1. Cardiac pacer with lead tips over the right atrium and right ventricle. Heart size normal. No pulmonary venous congestion. 2. Low lung volumes with persistent bibasilar atelectasis. Tiny bilateral pleural  effusions cannot be excluded. Electronically Signed   By: Marcello Moores  Register   On: 09/23/2020 07:45   CARDIAC CATHETERIZATION  Result Date: 09/21/2020  Prox LAD to Mid LAD lesion is 15% stenosed.  Prox LAD lesion is 40% stenosed.  Mid LAD lesion is 30% stenosed.  Prox RCA to Mid RCA lesion is 100% stenosed.  LV end diastolic pressure is moderately elevated.  1. 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 2. Patent stent in the LAD 3. Moderately elevated LVEDP 25 mm Hg Plan: continue medical therapy.   EP PPM/ICD IMPLANT  Result Date: 09/22/2020 SURGEON:  Will Meredith Leeds, MD   PREPROCEDURE DIAGNOSIS:  Sinus arrest, syncope   POSTPROCEDURE DIAGNOSIS:  Sinus arrest, syncope    PROCEDURES:  1. Pacemaker implantation.   INTRODUCTION: MICHIAH MUDRY is a 59 y.o. male  with a history of bradycardia who presents today for pacemaker implantation.  The patient reports intermittent episodes of syncope over the past few months.  No  reversible causes have been identified.  The patient therefore presents today for pacemaker implantation.   DESCRIPTION OF PROCEDURE:  Informed written consent was obtained, and  the patient was brought to the electrophysiology lab in a fasting state.  The patient required no sedation for the procedure today.  The patients left chest was prepped and draped in the usual sterile fashion by the EP lab staff. The skin overlying the left deltopectoral region was infiltrated with lidocaine for local analgesia.  A 4-cm incision was made over the left deltopectoral region.  A left subcutaneous pacemaker pocket was fashioned using a combination of sharp and blunt dissection. Electrocautery was required to assure hemostasis.  RA/RV Lead Placement: The left axillary vein was cannulated.  Through the left axillary vein, a Medtronic model 5076 (serial number [id number]) right atrial lead and a Medtronic model 5076 (serial number [id number]) right ventricular lead were advanced with fluoroscopic visualization into the right atrial appendage and right ventricular apex positions respectively.  Initial atrial lead P- waves measured 3.19mV with impedance of 691 ohms and a threshold of 0.9 V at 0.5 msec.  Right ventricular lead R-waves measured 6.1 mV with an impedance of 1767 ohms and a threshold of 1 V at 0.5 msec.  Both leads were secured to the pectoralis fascia using #2-0 silk over the suture sleeves. Device Placement:  The leads were then connected to a Medtronic Azure XT DR MRI SureScan (serial number [id number]) pacemaker.  The pocket was irrigated with copious gentamicin solution.  The pacemaker was then placed into the pocket.  The pocket was then closed in 3 layers with 2.0 Vicryl suture for the subcutaneous and 3.0 Vicryl suture subcuticular layers.  Steri-Strips and a sterile dressing were then applied. EBL<84ml. There were no early apparent complications.   CONCLUSIONS:  1. Successful implantation of a Medtronic  Azure XT DR MRI SureScan dual-chamber pacemaker for symptomatic bradycardia  2. No early apparent complications.       Will Meredith Leeds, MD 09/22/2020 4:24 PM  ECHOCARDIOGRAM COMPLETE  Result Date: 09/21/2020    ECHOCARDIOGRAM REPORT   Patient Name:   Todd [name] Tine Date of Exam: 09/21/2020 Medical Rec #:  [id number]     Height:       67.0 in Accession #:    [id number]    Weight:       211.0 lb Date of Birth:  March 05, 1961     BSA:  2.069 m Patient Age:    31 years      BP:           147/95 mmHg Patient Gender: M             HR:           65 bpm. Exam Location:  Inpatient Procedure: 2D Echo Indications:    780.2 syncope  History:        Patient has no prior history of Echocardiogram examinations. CAD                 and Previous Myocardial Infarction; Risk Factors:Diabetes,                 Dyslipidemia and Current Smoker. Tobacco abuse.  Sonographer:    Jannett Celestine RDCS (AE) Referring Phys: [id number] VISHAL R PATEL IMPRESSIONS  1. Left ventricular ejection fraction, by estimation, is 60 to 65%. The left ventricle has normal function. The left ventricle has no regional wall motion abnormalities. There is mild left ventricular hypertrophy. Left ventricular diastolic parameters were normal.  2. Right ventricular systolic function is normal. The right ventricular size is normal.  3. Left atrial size was mildly dilated.  4. The mitral valve is normal in structure. Trivial mitral valve regurgitation. No evidence of mitral stenosis.  5. The aortic valve is tricuspid. Aortic valve regurgitation is not visualized. Mild to moderate aortic valve sclerosis/calcification is present, without any evidence of aortic stenosis.  6. Aortic dilatation noted. There is mild dilatation of the ascending aorta, measuring 39 mm.  7. The inferior vena cava is normal in size with greater than 50% respiratory variability, suggesting right atrial pressure of 3 mmHg. FINDINGS  Left Ventricle: Left ventricular ejection fraction, by  estimation, is 60 to 65%. The left ventricle has normal function. The left ventricle has no regional wall motion abnormalities. The left ventricular internal cavity size was normal in size. There is  mild left ventricular hypertrophy. Left ventricular diastolic parameters were normal. Right Ventricle: The right ventricular size is normal. No increase in right ventricular wall thickness. Right ventricular systolic function is normal. Left Atrium: Left atrial size was mildly dilated. Right Atrium: Right atrial size was normal in size. Pericardium: There is no evidence of pericardial effusion. Mitral Valve: The mitral valve is normal in structure. Trivial mitral valve regurgitation. No evidence of mitral valve stenosis. Tricuspid Valve: The tricuspid valve is normal in structure. Tricuspid valve regurgitation is not demonstrated. No evidence of tricuspid stenosis. Aortic Valve: The aortic valve is tricuspid. Aortic valve regurgitation is not visualized. Mild to moderate aortic valve sclerosis/calcification is present, without any evidence of aortic stenosis. Pulmonic Valve: The pulmonic valve was normal in structure. Pulmonic valve regurgitation is not visualized. No evidence of pulmonic stenosis. Aorta: The aortic root is normal in size and structure and aortic dilatation noted. There is mild dilatation of the ascending aorta, measuring 39 mm. Venous: The inferior vena cava is normal in size with greater than 50% respiratory variability, suggesting right atrial pressure of 3 mmHg. IAS/Shunts: No atrial level shunt detected by color flow Doppler.  LEFT VENTRICLE PLAX 2D LVIDd:         4.30 cm  Diastology LVIDs:         3.20 cm  LV e' medial:    7.83 cm/s LV PW:         1.30 cm  LV E/e' medial:  14.4 LV IVS:        1.40 cm  LV e' lateral:   10.00 cm/s LVOT diam:     2.50 cm  LV E/e' lateral: 11.3 LV SV:         171 LV SV Index:   83 LVOT Area:     4.91 cm  RIGHT VENTRICLE RV S prime:     11.70 cm/s TAPSE (M-mode): 2.4  cm LEFT ATRIUM             Index       RIGHT ATRIUM           Index LA diam:        4.10 cm 1.98 cm/m  RA Area:     14.30 cm LA Vol (A2C):   57.8 ml 27.94 ml/m RA Volume:   31.00 ml  14.98 ml/m LA Vol (A4C):   39.7 ml 19.19 ml/m LA Biplane Vol: 48.7 ml 23.54 ml/m  AORTIC VALVE LVOT Vmax:   135.00 cm/s LVOT Vmean:  94.200 cm/s LVOT VTI:    0.349 m  AORTA Ao Root diam: 3.20 cm MITRAL VALVE MV Area (PHT): 3.27 cm     SHUNTS MV Decel Time: 232 msec     Systemic VTI:  0.35 m MV E velocity: 113.00 cm/s  Systemic Diam: 2.50 cm MV A velocity: 59.10 cm/s MV E/A ratio:  1.91 Jenkins Rouge MD Electronically signed by Jenkins Rouge MD Signature Date/Time: 09/21/2020/12:34:20 PM    Final     Cardiac Studies:  ECG: SB rate 58 11/15   Telemetry: SR rates 70-80   Echo: EF 60-65%   Medications:   . aspirin EC  81 mg Oral Daily  . atorvastatin  80 mg Oral QHS  . Chlorhexidine Gluconate Cloth  6 each Topical Daily  . fluticasone furoate-vilanterol  1 puff Inhalation q AM  . insulin aspart  0-5 Units Subcutaneous QHS  . insulin aspart  0-9 Units Subcutaneous TID WC  . losartan  50 mg Oral Daily  . mouth rinse  15 mL Mouth Rinse BID  . nicotine  21 mg Transdermal Daily  . pantoprazole  40 mg Oral QHS  . sodium chloride flush  3 mL Intravenous Q12H  . umeclidinium bromide  1 puff Inhalation Daily       Assessment/Plan:   1. Syncope:  Cath yesterday with stable CAD collaterals to RCA and patent LAD stent. Echo with normal EF no new RWMA;s Sinus node arrest noted post cath. Coreg D/C Now post AV pacer by Dr Quentin Ore 09/22/20. Some tenderness over site but no hematoma. CXR this am no pneumothorax and normal thresholds.  Ok to d/c home will have EP f/u in 2 weeks. 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

## 2020-09-23 NOTE — Plan of Care (Signed)
  Problem: Education: Goal: Understanding of CV disease, CV risk reduction, and recovery process will improve Outcome: Completed/Met Goal: Individualized Educational Video(s) Outcome: Completed/Met   Problem: Activity: Goal: Ability to return to baseline activity level will improve Outcome: Completed/Met   Problem: Cardiovascular: Goal: Ability to achieve and maintain adequate cardiovascular perfusion will improve Outcome: Completed/Met Goal: Vascular access site(s) Level 0-1 will be maintained Outcome: Completed/Met   Problem: Health Behavior/Discharge Planning: Goal: Ability to safely manage health-related needs after discharge will improve Outcome: Completed/Met   Problem: Education: Goal: Knowledge of General Education information will improve Description: Including pain rating scale, medication(s)/side effects and non-pharmacologic comfort measures Outcome: Completed/Met   Problem: Health Behavior/Discharge Planning: Goal: Ability to manage health-related needs will improve Outcome: Completed/Met   Problem: Clinical Measurements: Goal: Ability to maintain clinical measurements within normal limits will improve Outcome: Completed/Met Goal: Will remain free from infection Outcome: Completed/Met Goal: Diagnostic test results will improve Outcome: Completed/Met Goal: Respiratory complications will improve Outcome: Completed/Met Goal: Cardiovascular complication will be avoided Outcome: Completed/Met   Problem: Activity: Goal: Risk for activity intolerance will decrease Outcome: Completed/Met   Problem: Nutrition: Goal: Adequate nutrition will be maintained Outcome: Completed/Met   Problem: Coping: Goal: Level of anxiety will decrease Outcome: Completed/Met   Problem: Elimination: Goal: Will not experience complications related to bowel motility Outcome: Completed/Met Goal: Will not experience complications related to urinary retention Outcome: Completed/Met   Problem: Pain Managment: Goal: General experience of comfort will improve Outcome: Completed/Met   Problem: Safety: Goal: Ability to remain free from injury will improve Outcome: Completed/Met   Problem: Skin Integrity: Goal: Risk for impaired skin integrity will decrease Outcome: Completed/Met   

## 2020-09-23 NOTE — Discharge Instructions (Signed)
After Your Pacemaker   . You have a Medtronic Pacemaker  ACTIVITY . Do not lift your arm above shoulder height for 1 week after your procedure. After 7 days, you may progress as below.  . You should remove your sling 24 hours after your procedure, unless otherwise instructed by your provider.     Thursday September 30, 2020  Friday October 01, 2020 Saturday October 02, 2020 Sunday October 03, 2020   . Do not lift, push, pull, or carry anything over 10 pounds with the affected arm until 6 weeks (Thursday November 04, 2020 ) after your procedure.   . Do NOT DRIVE until you have been seen for your wound check, or as long as instructed by your healthcare provider.   . Ask your healthcare provider when you can go back to work   INCISION/Dressing . If you are on a blood thinner such as Coumadin, Xarelto, Eliquis, Plavix, or Pradaxa please confirm with your provider when this should be resumed.   . If large square, outer bandage is left in place, this can be removed after 24 hours from your procedure. Do not remove steri-strips or glue as below.   . Monitor your Pacemaker site for redness, swelling, and drainage. Call the device clinic at [phone] if you experience these symptoms or fever/chills.  . If your incision is sealed with Steri-strips or staples, you may shower 10 days after your procedure or when told by your provider. Do not remove the steri-strips or let the shower hit directly on your site. You may wash around your site with soap and water.    Marland Kitchen Avoid lotions, ointments, or perfumes over your incision until it is well-healed.  . You may use a hot tub or a pool AFTER your wound check appointment if the incision is completely closed.  Marland Kitchen PAcemaker Alerts:  Some alerts are vibratory and others beep. These are NOT emergencies. Please call our office to let us know. If this occurs at night or on weekends, it can wait until the next business day. Send a remote  transmission.  . If your device is capable of reading fluid status (for heart failure), you will be offered monthly monitoring to review this with you.   DEVICE MANAGEMENT . Remote monitoring is used to monitor your pacemaker from home. This monitoring is scheduled every 91 days by our office. It allows [place] to keep an eye on the functioning of your device to ensure it is working properly. You will routinely see your Electrophysiologist annually (more often if necessary).   . You should receive your ID card for your new device in 4-8 weeks. Keep this card with you at all times once received. Consider wearing a medical alert bracelet or necklace.  . Your Pacemaker may be MRI compatible. This will be discussed at your next office visit/wound check.  You should avoid contact with strong electric or magnetic fields.    Do not use amateur (ham) radio equipment or electric (arc) welding torches. MP3 player headphones with magnets should not be used. Some devices are safe to use if held at least 12 inches (30 cm) from your Pacemaker. These include power tools, lawn mowers, and speakers. If you are unsure if something is safe to use, ask your health care provider.   When using your cell phone, hold it to the ear that is on the opposite side from the Pacemaker. Do not leave your cell phone in a pocket over the Pacemaker.  You may safely use electric blankets, heating pads, computers, and microwave ovens.  Call the office right away if:  You have chest pain.  You feel more short of breath than you have felt before.  You feel more light-headed than you have felt before.  Your incision starts to open up.  This information is not intended to replace advice given to you by your health care provider. Make sure you discuss any questions you have with your health care provider.

## 2020-09-23 NOTE — Plan of Care (Signed)
Patient discharged, PIV removed  AVS printed, instructions provided Home O2  Provided by home health and instructions of use given. Patient left accompanied by daughter

## 2020-09-23 NOTE — TOC Transition Note (Signed)
Transition of Care [facility]) - CM/SW Discharge Note   Patient Details  Name: Todd [name] MRN: [id number] Date of Birth: 03-02-1961  [facility]) CM/SW Contact:  Sharin Mons, RN Phone Number: [phone] 09/23/2020, 11:40 AM   Clinical Narrative:    Patient will DC to: home  Anticipated DC date: 09/23/2020 Family notified: wife Transport by: car  Presented with c/o syncopal episodes, bradycardia.  Hx of h/o CAD with NSTEMI with DES to mid LAD, Preserved EF, DM2, HTN, and HLD. From home with wife. Pt states PTA independent with ADL's, no DME usage.        - s/p cath/ AV pacer  09/22/20   Per MD patient ready for DC today . RN, patient, patient's family aware of DC. NCM noted [place] and DME orders. Pt agreeable to services. Choice provided. Pt without preference. Referral made with [facility] for HHPT services and accepted. [employer] received referral for DME: oxygen. Once approved [employer] [phone]) to delivery portable oxygen tank for transportation to home.   Pt with noted post hospital f/u appointments noted on AVS. Noted Rx meds,NCM shared with pt's wife. Wife states was on Metformin prior to hospitalization and was affordable. States she will call local pharmacy to learn cost of Nicoderm patch...NCM explained sometimes can be pricey. No resources to assist with cost from NCM 2/2 pt with insurance.    RNCM will sign off for now as intervention is no longer needed. Please consult [place] again if new needs arise.    Final next level of care: [place] Barriers to Discharge: No Barriers Identified   Patient Goals and CMS Choice Patient states their goals for this hospitalization and ongoing recovery are:: to go home and get better   Choice offered to / list presented to : Patient, Spouse  Discharge Placement                       Discharge Plan and Services                DME Arranged: Oxygen DME Agency: [employer] Date DME  Agency Contacted: 09/23/20 Time DME Agency Contacted: [id number] Representative spoke with at DME Agency: [place] Arranged: PT   Date [place]: 09/23/20 Time [place]: [id number] Representative spoke with at [place]: [place] ([place]) Interventions     Readmission Risk Interventions No flowsheet data found.

## 2020-09-25 DIAGNOSIS — R269 Unspecified abnormalities of gait and mobility: Secondary | ICD-10-CM | POA: Diagnosis not present

## 2020-09-25 DIAGNOSIS — J9811 Atelectasis: Secondary | ICD-10-CM | POA: Diagnosis not present

## 2020-09-25 DIAGNOSIS — J449 Chronic obstructive pulmonary disease, unspecified: Secondary | ICD-10-CM | POA: Diagnosis not present

## 2020-09-25 LAB — CULTURE, BLOOD (ROUTINE X 2)
Culture: NO GROWTH
Culture: NO GROWTH
Special Requests: ADEQUATE

## 2020-09-27 DIAGNOSIS — I2510 Atherosclerotic heart disease of native coronary artery without angina pectoris: Secondary | ICD-10-CM | POA: Diagnosis not present

## 2020-09-27 DIAGNOSIS — I152 Hypertension secondary to endocrine disorders: Secondary | ICD-10-CM | POA: Diagnosis not present

## 2020-09-27 DIAGNOSIS — M1712 Unilateral primary osteoarthritis, left knee: Secondary | ICD-10-CM | POA: Diagnosis not present

## 2020-09-27 DIAGNOSIS — Z48812 Encounter for surgical aftercare following surgery on the circulatory system: Secondary | ICD-10-CM | POA: Diagnosis not present

## 2020-09-27 DIAGNOSIS — E1159 Type 2 diabetes mellitus with other circulatory complications: Secondary | ICD-10-CM | POA: Diagnosis not present

## 2020-10-05 ENCOUNTER — Ambulatory Visit (INDEPENDENT_AMBULATORY_CARE_PROVIDER_SITE_OTHER): Payer: Medicare Other | Admitting: Family Medicine

## 2020-10-05 ENCOUNTER — Other Ambulatory Visit: Payer: Self-pay

## 2020-10-05 VITALS — BP 120/70 | HR 87 | Temp 97.4°F | Ht 67.0 in | Wt 211.0 lb

## 2020-10-05 DIAGNOSIS — J439 Emphysema, unspecified: Secondary | ICD-10-CM | POA: Diagnosis not present

## 2020-10-05 DIAGNOSIS — Z48812 Encounter for surgical aftercare following surgery on the circulatory system: Secondary | ICD-10-CM | POA: Diagnosis not present

## 2020-10-05 DIAGNOSIS — Z720 Tobacco use: Secondary | ICD-10-CM

## 2020-10-05 DIAGNOSIS — M1712 Unilateral primary osteoarthritis, left knee: Secondary | ICD-10-CM | POA: Diagnosis not present

## 2020-10-05 DIAGNOSIS — Z950 Presence of cardiac pacemaker: Secondary | ICD-10-CM

## 2020-10-05 DIAGNOSIS — F411 Generalized anxiety disorder: Secondary | ICD-10-CM

## 2020-10-05 DIAGNOSIS — I152 Hypertension secondary to endocrine disorders: Secondary | ICD-10-CM | POA: Diagnosis not present

## 2020-10-05 DIAGNOSIS — E1159 Type 2 diabetes mellitus with other circulatory complications: Secondary | ICD-10-CM | POA: Diagnosis not present

## 2020-10-05 DIAGNOSIS — I2510 Atherosclerotic heart disease of native coronary artery without angina pectoris: Secondary | ICD-10-CM | POA: Diagnosis not present

## 2020-10-05 MED ORDER — HYDROCODONE-ACETAMINOPHEN 5-325 MG PO TABS: 1.0000 | ORAL_TABLET | Freq: Four times a day (QID) | ORAL | 0 refills | Status: DC | PRN

## 2020-10-05 MED ORDER — DIAZEPAM 5 MG PO TABS: 5.0000 mg | ORAL_TABLET | Freq: Three times a day (TID) | ORAL | 1 refills | Status: DC | PRN
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
## 2020-10-07 ENCOUNTER — Other Ambulatory Visit: Payer: Self-pay

## 2020-10-07 ENCOUNTER — Ambulatory Visit (INDEPENDENT_AMBULATORY_CARE_PROVIDER_SITE_OTHER): Payer: Medicare Other | Admitting: Emergency Medicine

## 2020-10-07 DIAGNOSIS — I455 Other specified heart block: Secondary | ICD-10-CM

## 2020-10-07 DIAGNOSIS — E1159 Type 2 diabetes mellitus with other circulatory complications: Secondary | ICD-10-CM | POA: Diagnosis not present

## 2020-10-07 DIAGNOSIS — Z48812 Encounter for surgical aftercare following surgery on the circulatory system: Secondary | ICD-10-CM | POA: Diagnosis not present

## 2020-10-07 DIAGNOSIS — I152 Hypertension secondary to endocrine disorders: Secondary | ICD-10-CM | POA: Diagnosis not present

## 2020-10-07 DIAGNOSIS — M1712 Unilateral primary osteoarthritis, left knee: Secondary | ICD-10-CM | POA: Diagnosis not present

## 2020-10-07 DIAGNOSIS — I2510 Atherosclerotic heart disease of native coronary artery without angina pectoris: Secondary | ICD-10-CM | POA: Diagnosis not present

## 2020-10-07 DIAGNOSIS — K219 Gastro-esophageal reflux disease without esophagitis: Secondary | ICD-10-CM

## 2020-10-07 LAB — CUP PACEART INCLINIC DEVICE CHECK
Battery Remaining Longevity: 177 mo
Battery Voltage: 3.22 V
Brady Statistic AP VP Percent: 0.02 %
Brady Statistic AP VS Percent: 14.44 %
Brady Statistic AS VP Percent: 0 %
Brady Statistic AS VS Percent: 85.54 %
Brady Statistic RA Percent Paced: 14.54 %
Brady Statistic RV Percent Paced: 0.02 %
Date Time Interrogation Session: 20211202092035
Implantable Lead Implant Date: 20211117
Implantable Lead Implant Date: 20211117
Implantable Lead Location: 753859
Implantable Lead Location: 753860
Implantable Lead Model: 5076
Implantable Lead Model: 5076
Implantable Pulse Generator Implant Date: 20211117
Lead Channel Impedance Value: 361 Ohm
Lead Channel Impedance Value: 437 Ohm
Lead Channel Impedance Value: 456 Ohm
Lead Channel Impedance Value: 513 Ohm
Lead Channel Pacing Threshold Amplitude: 0.5 V
Lead Channel Pacing Threshold Amplitude: 0.75 V
Lead Channel Pacing Threshold Pulse Width: 0.4 ms
Lead Channel Pacing Threshold Pulse Width: 0.4 ms
Lead Channel Sensing Intrinsic Amplitude: 1.75 mV
Lead Channel Sensing Intrinsic Amplitude: 9.25 mV
Lead Channel Setting Pacing Amplitude: 3.5 V
Lead Channel Setting Pacing Amplitude: 3.5 V
Lead Channel Setting Pacing Pulse Width: 0.4 ms
Lead Channel Setting Sensing Sensitivity: 1.2 mV

## 2020-10-07 NOTE — Progress Notes (Signed)
Wound check appointment. Steri-strips removed. Wound without redness or edema. Incision edges approximated, wound well healed. Normal device function. Thresholds, sensing, and impedances consistent with implant measurements. Device programmed at 3.5V/auto capture programmed on for extra safety margin until 3 month visit. Histogram distribution appropriate for patient and level of activity. No mode switches or high ventricular rates noted. Patient educated about wound care, arm mobility, lifting restrictions. ROV with Dr Curt Bears 12/29/19. Enrolled in remote follow-up and next remote 12/24/19.

## 2020-10-11 DIAGNOSIS — Z48812 Encounter for surgical aftercare following surgery on the circulatory system: Secondary | ICD-10-CM | POA: Diagnosis not present

## 2020-10-11 DIAGNOSIS — E1159 Type 2 diabetes mellitus with other circulatory complications: Secondary | ICD-10-CM | POA: Diagnosis not present

## 2020-10-11 DIAGNOSIS — I152 Hypertension secondary to endocrine disorders: Secondary | ICD-10-CM | POA: Diagnosis not present

## 2020-10-11 DIAGNOSIS — I2510 Atherosclerotic heart disease of native coronary artery without angina pectoris: Secondary | ICD-10-CM | POA: Diagnosis not present

## 2020-10-11 DIAGNOSIS — M1712 Unilateral primary osteoarthritis, left knee: Secondary | ICD-10-CM | POA: Diagnosis not present

## 2020-10-14 DIAGNOSIS — I152 Hypertension secondary to endocrine disorders: Secondary | ICD-10-CM | POA: Diagnosis not present

## 2020-10-14 DIAGNOSIS — E1159 Type 2 diabetes mellitus with other circulatory complications: Secondary | ICD-10-CM | POA: Diagnosis not present

## 2020-10-14 DIAGNOSIS — M1712 Unilateral primary osteoarthritis, left knee: Secondary | ICD-10-CM | POA: Diagnosis not present

## 2020-10-14 DIAGNOSIS — I2510 Atherosclerotic heart disease of native coronary artery without angina pectoris: Secondary | ICD-10-CM | POA: Diagnosis not present

## 2020-10-14 DIAGNOSIS — Z48812 Encounter for surgical aftercare following surgery on the circulatory system: Secondary | ICD-10-CM | POA: Diagnosis not present

## 2020-10-19 DIAGNOSIS — E1159 Type 2 diabetes mellitus with other circulatory complications: Secondary | ICD-10-CM | POA: Diagnosis not present

## 2020-10-19 DIAGNOSIS — I152 Hypertension secondary to endocrine disorders: Secondary | ICD-10-CM | POA: Diagnosis not present

## 2020-10-19 DIAGNOSIS — M1712 Unilateral primary osteoarthritis, left knee: Secondary | ICD-10-CM | POA: Diagnosis not present

## 2020-10-19 DIAGNOSIS — I2510 Atherosclerotic heart disease of native coronary artery without angina pectoris: Secondary | ICD-10-CM | POA: Diagnosis not present

## 2020-10-19 DIAGNOSIS — Z48812 Encounter for surgical aftercare following surgery on the circulatory system: Secondary | ICD-10-CM | POA: Diagnosis not present

## 2020-10-21 ENCOUNTER — Ambulatory Visit: Payer: Medicare Other | Admitting: Emergency Medicine

## 2020-10-21 DIAGNOSIS — Z48812 Encounter for surgical aftercare following surgery on the circulatory system: Secondary | ICD-10-CM | POA: Diagnosis not present

## 2020-10-21 DIAGNOSIS — M1712 Unilateral primary osteoarthritis, left knee: Secondary | ICD-10-CM | POA: Diagnosis not present

## 2020-10-21 DIAGNOSIS — E1159 Type 2 diabetes mellitus with other circulatory complications: Secondary | ICD-10-CM | POA: Diagnosis not present

## 2020-10-21 DIAGNOSIS — I2510 Atherosclerotic heart disease of native coronary artery without angina pectoris: Secondary | ICD-10-CM | POA: Diagnosis not present

## 2020-10-21 DIAGNOSIS — I152 Hypertension secondary to endocrine disorders: Secondary | ICD-10-CM | POA: Diagnosis not present

## 2020-10-23 DIAGNOSIS — R269 Unspecified abnormalities of gait and mobility: Secondary | ICD-10-CM | POA: Diagnosis not present

## 2020-10-23 DIAGNOSIS — J9811 Atelectasis: Secondary | ICD-10-CM | POA: Diagnosis not present

## 2020-10-23 DIAGNOSIS — J449 Chronic obstructive pulmonary disease, unspecified: Secondary | ICD-10-CM | POA: Diagnosis not present

## 2020-10-25 DIAGNOSIS — J449 Chronic obstructive pulmonary disease, unspecified: Secondary | ICD-10-CM | POA: Diagnosis not present

## 2020-10-25 DIAGNOSIS — J9811 Atelectasis: Secondary | ICD-10-CM | POA: Diagnosis not present

## 2020-10-25 DIAGNOSIS — R269 Unspecified abnormalities of gait and mobility: Secondary | ICD-10-CM | POA: Diagnosis not present

## 2020-10-26 DIAGNOSIS — Z48812 Encounter for surgical aftercare following surgery on the circulatory system: Secondary | ICD-10-CM | POA: Diagnosis not present

## 2020-10-26 DIAGNOSIS — E1159 Type 2 diabetes mellitus with other circulatory complications: Secondary | ICD-10-CM | POA: Diagnosis not present

## 2020-10-26 DIAGNOSIS — M1712 Unilateral primary osteoarthritis, left knee: Secondary | ICD-10-CM | POA: Diagnosis not present

## 2020-10-26 DIAGNOSIS — I152 Hypertension secondary to endocrine disorders: Secondary | ICD-10-CM | POA: Diagnosis not present

## 2020-10-26 DIAGNOSIS — I2510 Atherosclerotic heart disease of native coronary artery without angina pectoris: Secondary | ICD-10-CM | POA: Diagnosis not present

## 2020-11-17 ENCOUNTER — Encounter: Payer: Self-pay | Admitting: Emergency Medicine

## 2020-11-17 ENCOUNTER — Ambulatory Visit (INDEPENDENT_AMBULATORY_CARE_PROVIDER_SITE_OTHER): Payer: Medicare Other | Admitting: Emergency Medicine

## 2020-11-17 ENCOUNTER — Other Ambulatory Visit: Payer: Self-pay

## 2020-11-17 VITALS — BP 132/74 | HR 99 | Temp 97.0°F | Ht 67.0 in | Wt 214.8 lb

## 2020-11-17 DIAGNOSIS — R9389 Abnormal findings on diagnostic imaging of other specified body structures: Secondary | ICD-10-CM | POA: Diagnosis not present

## 2020-11-17 DIAGNOSIS — J9811 Atelectasis: Secondary | ICD-10-CM | POA: Diagnosis not present

## 2020-11-17 DIAGNOSIS — J449 Chronic obstructive pulmonary disease, unspecified: Secondary | ICD-10-CM

## 2020-11-17 DIAGNOSIS — Z720 Tobacco use: Secondary | ICD-10-CM | POA: Diagnosis not present

## 2020-11-17 NOTE — Progress Notes (Signed)
Subjective:    Patient ID: Todd [name], male    DOB: 26-Dec-1960, 60 y.o.   MRN: [id number]  HPI 60 year old active smoker (40 pack years) with a history of diabetes, CAD/MI (PTCI to LAD, medical management), hyperlipidemia, hypertension.  He is referred today for an abnormal CT scan of the chest.  He underwent a chest x-ray 07/25/2020 for cough that showed persistent right middle lobe consolidation.  A CT chest done 08/01/2020 was reviewed by me, shows some emphysematous change bilaterally, an area of large patchy consolidation, rounded, in the right middle lobe with focal narrowing of the origin of his right middle lobe bronchus and mild nodularity of his right mainstem bronchus extending distally.  There is also a 4 mm right middle lobe nodule.  ROV 09/06/20 --Todd [name] has a history of tobacco use, diabetes, coronary disease, hyperlipidemia, hypertension.  He is here today to follow-up his right middle lobe consolidation and his bronchoscopy that was done 08/26/2020.  There were raised smooth lesions throughout the bronchial tree especially on the right.  There was extrinsic compression of the right middle lobe airway and it could not be entered.  The largest lesion was in the distal bronchus intermedius and this was biopsied by forceps, needle, brushed.  The forceps biopsies showed evidence for benign cartilage and bronchial wall material, needle biopsy showed fragments of benign cartilage and inflammation with scant cellularity.  Brushings were negative.  He has been on Trelegy before, may have benefited some. The cost was high - $48, but he would be willing to retry. Has albuterol, uses about 3x a day. He does still have spells of coughing. Smoking about 2-3 cig a day.   ROV 11/17/20 --follow-up visit 60 year old man with active tobacco use, diabetes, CAD, hyperlipidemia, hypertension.  We performed bronchoscopy 08/26/2020 to evaluate right middle lobe collapse.  This showed raised smooth lesions in  the bronchial tree especially on the right with extrinsic compression of the right middle lobe airway.  Biopsies and sampling revealed benign cartilage and bronchial wall material without evidence for malignancy.  We discussed possibly setting up rigid bronchoscopy once he gets change in insurance confirmed.  In the meantime he was hospitalized 11/15 for syncope and had to have a pacemaker placed on 09/22/2020. He was d/c on O2 w exertion 2L/min.  He is on Trelegy. Feels that he is having some episode of panic. He feels more SOB. Uses albuterol about 2-3x a day. Gets relief from this.  He stopped smoking completely 10/07/20.   Left/right heart cath 09/21/2020 showed 100 sent RCA lesion with collaterals, other noncritical CAD, moderately elevated LVEDP 25 mmHg, patent LAD stent   Review of Systems As per HPI     Objective:   Physical Exam  Vitals:   11/17/20 0916  BP: 132/74  Pulse: 99  Temp: (!) 97 F (36.1 C)  SpO2: 98%  Weight: 214 lb 12.8 oz (97.4 kg)  Height: 5\' 7"  (1.702 m)   Gen: Pleasant, elderly man, in no distress,  normal affect  ENT: No lesions,  mouth clear,  oropharynx clear, no postnasal drip  Neck: No JVD, no stridor  Lungs: No use of accessory muscles, distant no crackles or wheezing on normal respiration, no wheeze on forced expiration  Cardiovascular: RRR, heart sounds normal, no murmur or gallops, no peripheral edema  Musculoskeletal: No deformities, no cyanosis or clubbing  Neuro: alert, awake, non focal  Skin: Warm, no lesions or rash     Assessment & Plan:  Collapse of right lung With endobronchial lesions that were cartilaginous on FOB  We will plan to repeat your Ct chest without contrast in late February to evaluate your airways Follow with Dr Lamonte Sakai in February after CT scan so that we can review the results together.  COPD (chronic obstructive pulmonary disease) ([place]) Please continue Trelegy 1 inhalation once daily.  Rinse and gargle after you  use this. Keep your albuterol available use 2 puffs when needed for shortness of breath, chest tightness, wheezing. COVID-19 vaccine up-to-date.  Get the booster when you are able.   Tobacco abuse Congratulations on stopping smoking  [name], MD, PhD 07/08/2021, 2:30 PM [facility] [phone] or if no answer [phone]

## 2020-11-17 NOTE — Patient Instructions (Addendum)
We will plan to repeat your Ct chest without contrast in late February to evaluate your airways Please continue Trelegy 1 inhalation once daily.  Rinse and gargle after you use this. Keep your albuterol available use 2 puffs when needed for shortness of breath, chest tightness, wheezing. Wear your oxygen at 2 L/min when you exert yourself.  Your goal is to keep your saturations greater than 90% Congratulations on stopping smoking COVID-19 vaccine up-to-date.  Get the booster when you are able. Follow with Dr Lamonte Sakai in February after CT scan so that we can review the results together.

## 2020-11-23 DIAGNOSIS — J449 Chronic obstructive pulmonary disease, unspecified: Secondary | ICD-10-CM | POA: Diagnosis not present

## 2020-11-23 DIAGNOSIS — R269 Unspecified abnormalities of gait and mobility: Secondary | ICD-10-CM | POA: Diagnosis not present

## 2020-11-23 DIAGNOSIS — J9811 Atelectasis: Secondary | ICD-10-CM | POA: Diagnosis not present

## 2020-11-25 DIAGNOSIS — R269 Unspecified abnormalities of gait and mobility: Secondary | ICD-10-CM | POA: Diagnosis not present

## 2020-11-25 DIAGNOSIS — J449 Chronic obstructive pulmonary disease, unspecified: Secondary | ICD-10-CM | POA: Diagnosis not present

## 2020-11-25 DIAGNOSIS — J9811 Atelectasis: Secondary | ICD-10-CM | POA: Diagnosis not present

## 2020-11-30 ENCOUNTER — Other Ambulatory Visit: Payer: Self-pay | Admitting: Family Medicine

## 2020-12-20 ENCOUNTER — Other Ambulatory Visit: Payer: Self-pay | Admitting: Family Medicine

## 2020-12-23 ENCOUNTER — Ambulatory Visit (INDEPENDENT_AMBULATORY_CARE_PROVIDER_SITE_OTHER): Payer: Medicare Other

## 2020-12-23 DIAGNOSIS — I455 Other specified heart block: Secondary | ICD-10-CM | POA: Diagnosis not present

## 2020-12-23 LAB — CUP PACEART REMOTE DEVICE CHECK
Battery Remaining Longevity: 180 mo
Battery Voltage: 3.21 V
Brady Statistic AP VP Percent: 0.02 %
Brady Statistic AP VS Percent: 10.02 %
Brady Statistic AS VP Percent: 0.02 %
Brady Statistic AS VS Percent: 89.95 %
Brady Statistic RA Percent Paced: 10.21 %
Brady Statistic RV Percent Paced: 0.04 %
Date Time Interrogation Session: 20220217041500
Implantable Lead Implant Date: 20211117
Implantable Lead Implant Date: 20211117
Implantable Lead Location: 753859
Implantable Lead Location: 753860
Implantable Lead Model: 5076
Implantable Lead Model: 5076
Implantable Pulse Generator Implant Date: 20211117
Lead Channel Impedance Value: 342 Ohm
Lead Channel Impedance Value: 418 Ohm
Lead Channel Impedance Value: 437 Ohm
Lead Channel Impedance Value: 532 Ohm
Lead Channel Pacing Threshold Amplitude: 0.5 V
Lead Channel Pacing Threshold Amplitude: 0.5 V
Lead Channel Pacing Threshold Pulse Width: 0.4 ms
Lead Channel Pacing Threshold Pulse Width: 0.4 ms
Lead Channel Sensing Intrinsic Amplitude: 0.875 mV
Lead Channel Sensing Intrinsic Amplitude: 0.875 mV
Lead Channel Sensing Intrinsic Amplitude: 9.375 mV
Lead Channel Sensing Intrinsic Amplitude: 9.375 mV
Lead Channel Setting Pacing Amplitude: 1.5 V
Lead Channel Setting Pacing Amplitude: 2 V
Lead Channel Setting Pacing Pulse Width: 0.4 ms
Lead Channel Setting Sensing Sensitivity: 1.2 mV

## 2020-12-24 ENCOUNTER — Telehealth: Payer: Self-pay | Admitting: Emergency Medicine

## 2020-12-24 DIAGNOSIS — J449 Chronic obstructive pulmonary disease, unspecified: Secondary | ICD-10-CM | POA: Diagnosis not present

## 2020-12-24 DIAGNOSIS — J9811 Atelectasis: Secondary | ICD-10-CM | POA: Diagnosis not present

## 2020-12-24 DIAGNOSIS — R269 Unspecified abnormalities of gait and mobility: Secondary | ICD-10-CM | POA: Diagnosis not present

## 2020-12-24 NOTE — Telephone Encounter (Signed)
Pt Daughter calling because pt oxgyen reader is showing 2, pt having paniac atttacks due to quality of life, wondering if he should  do surgery to open up lung, pt is scheduled for 2/21 with TP then again 2/28 with Dr Lamonte Sakai per pt daughter request, did advise daughter if she isn't on DPR list they may not speak with her.. states that she should be but if not her mother( pt wife) will be with her all day & she's on DPR please advise [phone]

## 2020-12-24 NOTE — Telephone Encounter (Signed)
Called and spoke with patient's daughter, Todd [name], advised that she was not on the DPR to speak with legally.  She wanted to know if it was ok to leave him on 3L.  Advised that he should be kept above 88%.  She asked that I call her mother again on her cell phone as they are traveling 3 hours to her sister's wedding and will be back for his appointment on Monday.  His CT scan was moved to 01/01/21.  Advised to ask for the DPR for when they come in on Monday to have her added to the list.  She verbalized understanding.  I called the patient's wife at [phone], Todd [name], listed on DPR, She states that on 2L his sats were 84%-89%.  They put him on 3L and his sats are 90-92%.  He is having panic attacks and cannot sleep laying down because he cannot breathe and is now sleeping in his recliner with his legs hanging down and his legs are swollen from the knee down.  He has 2+ pitting edema per his daughter who is a LPN that was riding in the car with them.  She states that he has pain in both of his legs left is greater than right.  The swelling is equal in both legs.  He has redness to both legs, some was present after his knee surgery and some is new.  He is sleeping a lot and is unable to come off his oxygen even for short periods of time.  Previously, he was able to take it off and walk short distances around the house and not be sob.  In the last couple of weeks he has really declined and they are wondering if this is the COPD or something else.  Dr. Lamonte Sakai, Please advise on any recommendations.  Thank you.

## 2020-12-24 NOTE — Telephone Encounter (Signed)
Very difficult for me to say whether this is only COPD, may have other contributors as well. I agree with titrating his oxygen to kep Spo2 > 88%. If he has progressive decline or acute symptoms, he'll need to be evaluated in the ED

## 2020-12-24 NOTE — Telephone Encounter (Signed)
pt daughter following up states they didnt get calls did leave additonal number to call .. also wanted to let Dr. Lamonte Sakai that  CT chest was rescheduled to 2/26 , pt currently taking Albuterol and trelegy  asking for any medication remcomondation to help pt---- [phone]

## 2020-12-24 NOTE — Telephone Encounter (Signed)
ATC patient unable to reach LM to call back office (x1)  

## 2020-12-24 NOTE — Telephone Encounter (Signed)
ATC x2 patient/wife, no answer and VM full.

## 2020-12-26 DIAGNOSIS — J449 Chronic obstructive pulmonary disease, unspecified: Secondary | ICD-10-CM | POA: Diagnosis not present

## 2020-12-26 DIAGNOSIS — E861 Hypovolemia: Secondary | ICD-10-CM | POA: Diagnosis not present

## 2020-12-26 DIAGNOSIS — J90 Pleural effusion, not elsewhere classified: Secondary | ICD-10-CM | POA: Diagnosis not present

## 2020-12-26 DIAGNOSIS — I2510 Atherosclerotic heart disease of native coronary artery without angina pectoris: Secondary | ICD-10-CM | POA: Diagnosis not present

## 2020-12-26 DIAGNOSIS — J9602 Acute respiratory failure with hypercapnia: Secondary | ICD-10-CM | POA: Diagnosis not present

## 2020-12-26 DIAGNOSIS — I517 Cardiomegaly: Secondary | ICD-10-CM | POA: Diagnosis not present

## 2020-12-26 DIAGNOSIS — R918 Other nonspecific abnormal finding of lung field: Secondary | ICD-10-CM | POA: Diagnosis not present

## 2020-12-26 DIAGNOSIS — F32A Depression, unspecified: Secondary | ICD-10-CM | POA: Diagnosis not present

## 2020-12-26 DIAGNOSIS — R2243 Localized swelling, mass and lump, lower limb, bilateral: Secondary | ICD-10-CM | POA: Diagnosis not present

## 2020-12-26 DIAGNOSIS — J9622 Acute and chronic respiratory failure with hypercapnia: Secondary | ICD-10-CM | POA: Diagnosis not present

## 2020-12-26 DIAGNOSIS — K219 Gastro-esophageal reflux disease without esophagitis: Secondary | ICD-10-CM | POA: Diagnosis not present

## 2020-12-26 DIAGNOSIS — E8770 Fluid overload, unspecified: Secondary | ICD-10-CM | POA: Diagnosis not present

## 2020-12-26 DIAGNOSIS — R269 Unspecified abnormalities of gait and mobility: Secondary | ICD-10-CM | POA: Diagnosis not present

## 2020-12-26 DIAGNOSIS — Z959 Presence of cardiac and vascular implant and graft, unspecified: Secondary | ICD-10-CM | POA: Diagnosis not present

## 2020-12-26 DIAGNOSIS — J9621 Acute and chronic respiratory failure with hypoxia: Secondary | ICD-10-CM | POA: Diagnosis not present

## 2020-12-26 DIAGNOSIS — I4891 Unspecified atrial fibrillation: Secondary | ICD-10-CM | POA: Diagnosis not present

## 2020-12-26 DIAGNOSIS — E878 Other disorders of electrolyte and fluid balance, not elsewhere classified: Secondary | ICD-10-CM | POA: Diagnosis not present

## 2020-12-26 DIAGNOSIS — Z96653 Presence of artificial knee joint, bilateral: Secondary | ICD-10-CM | POA: Diagnosis not present

## 2020-12-26 DIAGNOSIS — G9340 Encephalopathy, unspecified: Secondary | ICD-10-CM | POA: Diagnosis not present

## 2020-12-26 DIAGNOSIS — G253 Myoclonus: Secondary | ICD-10-CM | POA: Diagnosis not present

## 2020-12-26 DIAGNOSIS — I5033 Acute on chronic diastolic (congestive) heart failure: Secondary | ICD-10-CM | POA: Diagnosis not present

## 2020-12-26 DIAGNOSIS — J441 Chronic obstructive pulmonary disease with (acute) exacerbation: Secondary | ICD-10-CM | POA: Diagnosis not present

## 2020-12-26 DIAGNOSIS — I110 Hypertensive heart disease with heart failure: Secondary | ICD-10-CM | POA: Diagnosis not present

## 2020-12-26 DIAGNOSIS — E785 Hyperlipidemia, unspecified: Secondary | ICD-10-CM | POA: Diagnosis not present

## 2020-12-26 DIAGNOSIS — E873 Alkalosis: Secondary | ICD-10-CM | POA: Diagnosis not present

## 2020-12-26 DIAGNOSIS — T380X5A Adverse effect of glucocorticoids and synthetic analogues, initial encounter: Secondary | ICD-10-CM | POA: Diagnosis not present

## 2020-12-26 DIAGNOSIS — I442 Atrioventricular block, complete: Secondary | ICD-10-CM | POA: Diagnosis not present

## 2020-12-26 DIAGNOSIS — I509 Heart failure, unspecified: Secondary | ICD-10-CM | POA: Diagnosis not present

## 2020-12-26 DIAGNOSIS — R400 Somnolence: Secondary | ICD-10-CM | POA: Diagnosis not present

## 2020-12-26 DIAGNOSIS — R0601 Orthopnea: Secondary | ICD-10-CM | POA: Diagnosis not present

## 2020-12-26 DIAGNOSIS — R0602 Shortness of breath: Secondary | ICD-10-CM | POA: Diagnosis not present

## 2020-12-26 DIAGNOSIS — E871 Hypo-osmolality and hyponatremia: Secondary | ICD-10-CM | POA: Diagnosis not present

## 2020-12-26 DIAGNOSIS — J9811 Atelectasis: Secondary | ICD-10-CM | POA: Diagnosis not present

## 2020-12-26 DIAGNOSIS — I272 Pulmonary hypertension, unspecified: Secondary | ICD-10-CM | POA: Diagnosis not present

## 2020-12-26 DIAGNOSIS — E1165 Type 2 diabetes mellitus with hyperglycemia: Secondary | ICD-10-CM | POA: Diagnosis not present

## 2020-12-27 ENCOUNTER — Other Ambulatory Visit: Payer: Medicare Other

## 2020-12-27 ENCOUNTER — Ambulatory Visit: Payer: Medicare Other | Admitting: Adult Health

## 2020-12-27 NOTE — Telephone Encounter (Signed)
Pt does have an OV scheduled today 12/27/20 at 10:30 with TP.  Called and spoke with pt's wife [name] to see how pt was doing and she stated that pt became worse and had to have EMS called. Pt was taken to hospital in [place], [place]. Per [name], pt is now currently in the ICU at [facility] This info is able to be seen under the Care Everywhere section of pt's chart.  Stated to [place] that I was going to cancel pt's appt which was scheduled today with TP and she verbalized understanding.  Pt is also currently scheduled for a f/u with RB 2/28 after CT and stated to [facility] as it got closer to that appt to call office if that appt needed to be rescheduled as well if pt was still in hospital. [name] verbalized understanding. Routing to Dr. Lamonte Sakai as an Juluis Rainier. Nothing further needed.

## 2020-12-28 ENCOUNTER — Telehealth: Payer: Self-pay

## 2020-12-28 ENCOUNTER — Encounter: Payer: Medicare Other | Admitting: Cardiology

## 2020-12-28 NOTE — Telephone Encounter (Signed)
Call transferred to address concerns and wife reports that she is on another call with a provider at [facility] concerning her husband and she will call back.

## 2020-12-28 NOTE — Telephone Encounter (Signed)
The pt wife states he is in the [facility] hospital in [facility]. He is in the ICU with distalic HF. The doctor there told her it was 6 things on the transmission that states the pacemaker is not working. He will not be able to make his appointment for today. They had to take three liters of fluid off him. I let her speak with device nurse Jenny Reichmann, rn.

## 2020-12-30 ENCOUNTER — Telehealth: Payer: Self-pay | Admitting: Emergency Medicine

## 2020-12-30 NOTE — Progress Notes (Signed)
Remote pacemaker transmission.   

## 2020-12-30 NOTE — Telephone Encounter (Signed)
Spoke with patient's daughter. She was calling to see if she could get a copy of her dad's CT images. I advised her that we are not able to produce a CD with the images and that she would need to call medical records. I provided her with the number. She verbalized understanding. Nothing further needed at time of call.

## 2021-01-01 ENCOUNTER — Other Ambulatory Visit: Payer: Medicare Other

## 2021-01-03 ENCOUNTER — Ambulatory Visit: Payer: Medicare Other | Admitting: Emergency Medicine

## 2021-01-03 ENCOUNTER — Telehealth: Payer: Self-pay | Admitting: Emergency Medicine

## 2021-01-03 NOTE — Telephone Encounter (Signed)
Called the number provided- Dr Gretchen Short [phone]. Dr Mariel Kansky states would like to speak with Dr Lamonte Sakai directly. Forwarding to RB to call her. Thanks!

## 2021-01-03 NOTE — Telephone Encounter (Signed)
[employer] Student from [facility] in [place], [place]. Pt was in [place] for wedding, became very somnolent and altered. Pt was admitted to hospital from 2/20-2/27. Burman Nieves is wanting to speak with someone about pt's admission and send [place] any information/paperwork we may need. Maggie's attending Dr is Dr Gretchen Short [phone] Have submitted application for bipap but since pt lives in [place] and was out of town they're not sure how it will go. Please advise

## 2021-01-04 ENCOUNTER — Encounter: Payer: Medicare Other | Admitting: Cardiology

## 2021-01-05 ENCOUNTER — Telehealth: Payer: Self-pay | Admitting: Adult Health

## 2021-01-05 NOTE — Telephone Encounter (Signed)
After hours call from patient's daughter.  Patient was recently admitted at [facility] for COPD exacerbation, congestive heart failure and hypercarbic respiratory failure.  Patient has been home 2 days.  Daughter says that patient is getting worse.  Becoming more confused more somnolent increased shortness of breath .  Oxygen levels are dropping.  Patient not is alert.  Advised the patient will need further evaluation and and possible admission.  Will need to go to the emergency room for further assessment.  If unable to take via car call EMS.  Please contact office for sooner follow up if symptoms do not improve or worsen or seek emergency care    Send to Dr. Lamonte Sakai  For [facility]

## 2021-01-05 NOTE — Telephone Encounter (Signed)
He has been discharged. Unfortunately sounds like he is still unstable. See next phone note.

## 2021-01-07 ENCOUNTER — Ambulatory Visit (INDEPENDENT_AMBULATORY_CARE_PROVIDER_SITE_OTHER): Payer: Medicare Other | Admitting: Family Medicine

## 2021-01-07 ENCOUNTER — Other Ambulatory Visit: Payer: Self-pay

## 2021-01-07 ENCOUNTER — Encounter: Payer: Self-pay | Admitting: Family Medicine

## 2021-01-07 ENCOUNTER — Other Ambulatory Visit: Payer: Medicare Other

## 2021-01-07 VITALS — BP 118/70 | HR 89 | Temp 97.2°F | Ht 67.0 in | Wt 217.0 lb

## 2021-01-07 DIAGNOSIS — J441 Chronic obstructive pulmonary disease with (acute) exacerbation: Secondary | ICD-10-CM | POA: Diagnosis not present

## 2021-01-07 DIAGNOSIS — Z794 Long term (current) use of insulin: Secondary | ICD-10-CM

## 2021-01-07 DIAGNOSIS — E1165 Type 2 diabetes mellitus with hyperglycemia: Secondary | ICD-10-CM

## 2021-01-07 DIAGNOSIS — I50811 Acute right heart failure: Secondary | ICD-10-CM | POA: Diagnosis not present

## 2021-01-07 DIAGNOSIS — IMO0002 Reserved for concepts with insufficient information to code with codable children: Secondary | ICD-10-CM

## 2021-01-07 MED ORDER — FUROSEMIDE 40 MG PO TABS: 40.0000 mg | ORAL_TABLET | Freq: Every day | ORAL | 3 refills | Status: DC

## 2021-01-07 MED ORDER — PREDNISONE 20 MG PO TABS: ORAL_TABLET | ORAL | 0 refills | Status: DC

## 2021-01-07 MED ORDER — INSULIN ASPART 100 UNIT/ML ~~LOC~~ SOLN: 10.0000 [IU] | Freq: Four times a day (QID) | SUBCUTANEOUS | 99 refills | Status: DC

## 2021-01-07 NOTE — Telephone Encounter (Signed)
Thank you :)
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
## 2021-01-08 LAB — CBC WITH DIFFERENTIAL/PLATELET
Absolute Monocytes: 1064 cells/uL — ABNORMAL HIGH (ref 200–950)
Basophils Absolute: 34 cells/uL (ref 0–200)
Basophils Relative: 0.3 %
Eosinophils Absolute: 112 cells/uL (ref 15–500)
Eosinophils Relative: 1 %
HCT: 31.5 % — ABNORMAL LOW (ref 38.5–50.0)
Hemoglobin: 10.5 g/dL — ABNORMAL LOW (ref 13.2–17.1)
Lymphs Abs: 930 cells/uL (ref 850–3900)
MCH: 29.2 pg (ref 27.0–33.0)
MCHC: 33.3 g/dL (ref 32.0–36.0)
MCV: 87.7 fL (ref 80.0–100.0)
MPV: 9.5 fL (ref 7.5–12.5)
Monocytes Relative: 9.5 %
Neutro Abs: 9061 cells/uL — ABNORMAL HIGH (ref 1500–7800)
Neutrophils Relative %: 80.9 %
Platelets: 196 10*3/uL (ref 140–400)
RBC: 3.59 10*6/uL — ABNORMAL LOW (ref 4.20–5.80)
RDW: 12.7 % (ref 11.0–15.0)
Total Lymphocyte: 8.3 %
WBC: 11.2 10*3/uL — ABNORMAL HIGH (ref 3.8–10.8)

## 2021-01-08 LAB — COMPLETE METABOLIC PANEL WITH GFR
AG Ratio: 1.9 (calc) (ref 1.0–2.5)
ALT: 31 U/L (ref 9–46)
AST: 17 U/L (ref 10–35)
Albumin: 3.7 g/dL (ref 3.6–5.1)
Alkaline phosphatase (APISO): 77 U/L (ref 35–144)
BUN/Creatinine Ratio: 17 (calc) (ref 6–22)
BUN: 8 mg/dL (ref 7–25)
CO2: 40 mmol/L — ABNORMAL HIGH (ref 20–32)
Calcium: 9.1 mg/dL (ref 8.6–10.3)
Chloride: 87 mmol/L — ABNORMAL LOW (ref 98–110)
Creat: 0.48 mg/dL — ABNORMAL LOW (ref 0.70–1.25)
GFR, Est African American: 137 mL/min/{1.73_m2} (ref >=60)
GFR, Est Non African American: 118 mL/min/{1.73_m2} (ref >=60)
Globulin: 1.9 g/dL (calc) (ref 1.9–3.7)
Glucose, Bld: 122 mg/dL — ABNORMAL HIGH (ref 65–99)
Potassium: 4.7 mmol/L (ref 3.5–5.3)
Sodium: 133 mmol/L — ABNORMAL LOW (ref 135–146)
Total Bilirubin: 0.4 mg/dL (ref 0.2–1.2)
Total Protein: 5.6 g/dL — ABNORMAL LOW (ref 6.1–8.1)

## 2021-01-08 LAB — HEMOGLOBIN A1C
Hgb A1c MFr Bld: 7.8 % of total Hgb — ABNORMAL HIGH (ref <5.7)
Mean Plasma Glucose: 177 mg/dL
eAG (mmol/L): 9.8 mmol/L

## 2021-01-10 ENCOUNTER — Telehealth: Payer: Self-pay | Admitting: Family Medicine

## 2021-01-10 NOTE — Telephone Encounter (Signed)
Cover mymeds requesting a prior auth on Novolog 100 Unit/ML  Key [id number]  [facility]

## 2021-01-11 ENCOUNTER — Inpatient Hospital Stay (HOSPITAL_COMMUNITY)
Admission: EM | Admit: 2021-01-11 | Discharge: 2021-01-17 | DRG: 190 | Disposition: A | Discharge status: Nursing Home (Includes ICF) | Payer: Medicare Other | Attending: Internal Medicine | Admitting: Internal Medicine

## 2021-01-11 ENCOUNTER — Emergency Department (HOSPITAL_COMMUNITY): Payer: Medicare Other

## 2021-01-11 ENCOUNTER — Other Ambulatory Visit: Payer: Self-pay

## 2021-01-11 ENCOUNTER — Encounter (HOSPITAL_COMMUNITY): Payer: Self-pay

## 2021-01-11 DIAGNOSIS — Z20822 Contact with and (suspected) exposure to covid-19: Secondary | ICD-10-CM | POA: Diagnosis present

## 2021-01-11 DIAGNOSIS — J9621 Acute and chronic respiratory failure with hypoxia: Secondary | ICD-10-CM | POA: Diagnosis present

## 2021-01-11 DIAGNOSIS — Z950 Presence of cardiac pacemaker: Secondary | ICD-10-CM

## 2021-01-11 DIAGNOSIS — E871 Hypo-osmolality and hyponatremia: Secondary | ICD-10-CM | POA: Diagnosis present

## 2021-01-11 DIAGNOSIS — E785 Hyperlipidemia, unspecified: Secondary | ICD-10-CM | POA: Diagnosis not present

## 2021-01-11 DIAGNOSIS — I248 Other forms of acute ischemic heart disease: Secondary | ICD-10-CM | POA: Diagnosis not present

## 2021-01-11 DIAGNOSIS — D638 Anemia in other chronic diseases classified elsewhere: Secondary | ICD-10-CM | POA: Diagnosis not present

## 2021-01-11 DIAGNOSIS — Z7982 Long term (current) use of aspirin: Secondary | ICD-10-CM

## 2021-01-11 DIAGNOSIS — I2510 Atherosclerotic heart disease of native coronary artery without angina pectoris: Secondary | ICD-10-CM | POA: Diagnosis present

## 2021-01-11 DIAGNOSIS — I509 Heart failure, unspecified: Secondary | ICD-10-CM

## 2021-01-11 DIAGNOSIS — D649 Anemia, unspecified: Secondary | ICD-10-CM | POA: Diagnosis not present

## 2021-01-11 DIAGNOSIS — F419 Anxiety disorder, unspecified: Secondary | ICD-10-CM | POA: Diagnosis not present

## 2021-01-11 DIAGNOSIS — Z87891 Personal history of nicotine dependence: Secondary | ICD-10-CM

## 2021-01-11 DIAGNOSIS — G4733 Obstructive sleep apnea (adult) (pediatric): Secondary | ICD-10-CM | POA: Diagnosis present

## 2021-01-11 DIAGNOSIS — I5033 Acute on chronic diastolic (congestive) heart failure: Secondary | ICD-10-CM | POA: Diagnosis not present

## 2021-01-11 DIAGNOSIS — E876 Hypokalemia: Secondary | ICD-10-CM | POA: Diagnosis present

## 2021-01-11 DIAGNOSIS — J441 Chronic obstructive pulmonary disease with (acute) exacerbation: Principal | ICD-10-CM | POA: Diagnosis present

## 2021-01-11 DIAGNOSIS — F410 Panic disorder [episodic paroxysmal anxiety] without agoraphobia: Secondary | ICD-10-CM | POA: Diagnosis present

## 2021-01-11 DIAGNOSIS — J9610 Chronic respiratory failure, unspecified whether with hypoxia or hypercapnia: Secondary | ICD-10-CM | POA: Diagnosis not present

## 2021-01-11 DIAGNOSIS — D72828 Other elevated white blood cell count: Secondary | ICD-10-CM | POA: Diagnosis not present

## 2021-01-11 DIAGNOSIS — Z7984 Long term (current) use of oral hypoglycemic drugs: Secondary | ICD-10-CM | POA: Diagnosis not present

## 2021-01-11 DIAGNOSIS — Z96651 Presence of right artificial knee joint: Secondary | ICD-10-CM | POA: Diagnosis present

## 2021-01-11 DIAGNOSIS — E119 Type 2 diabetes mellitus without complications: Secondary | ICD-10-CM | POA: Diagnosis not present

## 2021-01-11 DIAGNOSIS — Z85828 Personal history of other malignant neoplasm of skin: Secondary | ICD-10-CM | POA: Diagnosis not present

## 2021-01-11 DIAGNOSIS — R0602 Shortness of breath: Secondary | ICD-10-CM | POA: Diagnosis not present

## 2021-01-11 DIAGNOSIS — E1159 Type 2 diabetes mellitus with other circulatory complications: Secondary | ICD-10-CM | POA: Diagnosis not present

## 2021-01-11 DIAGNOSIS — Z794 Long term (current) use of insulin: Secondary | ICD-10-CM | POA: Diagnosis not present

## 2021-01-11 DIAGNOSIS — I152 Hypertension secondary to endocrine disorders: Secondary | ICD-10-CM | POA: Diagnosis not present

## 2021-01-11 DIAGNOSIS — K219 Gastro-esophageal reflux disease without esophagitis: Secondary | ICD-10-CM | POA: Diagnosis present

## 2021-01-11 DIAGNOSIS — I252 Old myocardial infarction: Secondary | ICD-10-CM | POA: Diagnosis not present

## 2021-01-11 DIAGNOSIS — Z955 Presence of coronary angioplasty implant and graft: Secondary | ICD-10-CM

## 2021-01-11 DIAGNOSIS — J9622 Acute and chronic respiratory failure with hypercapnia: Secondary | ICD-10-CM | POA: Diagnosis present

## 2021-01-11 DIAGNOSIS — J449 Chronic obstructive pulmonary disease, unspecified: Secondary | ICD-10-CM | POA: Diagnosis not present

## 2021-01-11 DIAGNOSIS — R600 Localized edema: Secondary | ICD-10-CM | POA: Diagnosis not present

## 2021-01-11 DIAGNOSIS — E1165 Type 2 diabetes mellitus with hyperglycemia: Secondary | ICD-10-CM | POA: Diagnosis not present

## 2021-01-11 DIAGNOSIS — Z79899 Other long term (current) drug therapy: Secondary | ICD-10-CM | POA: Diagnosis not present

## 2021-01-11 DIAGNOSIS — I5032 Chronic diastolic (congestive) heart failure: Secondary | ICD-10-CM | POA: Diagnosis present

## 2021-01-11 DIAGNOSIS — I110 Hypertensive heart disease with heart failure: Secondary | ICD-10-CM | POA: Diagnosis present

## 2021-01-11 DIAGNOSIS — Z8249 Family history of ischemic heart disease and other diseases of the circulatory system: Secondary | ICD-10-CM

## 2021-01-11 LAB — CBC
HCT: 34.2 % — ABNORMAL LOW (ref 39.0–52.0)
Hemoglobin: 10.6 g/dL — ABNORMAL LOW (ref 13.0–17.0)
MCH: 28.6 pg (ref 26.0–34.0)
MCHC: 31 g/dL (ref 30.0–36.0)
MCV: 92.2 fL (ref 80.0–100.0)
Platelets: 252 10*3/uL (ref 150–400)
RBC: 3.71 MIL/uL — ABNORMAL LOW (ref 4.22–5.81)
RDW: 13.6 % (ref 11.5–15.5)
WBC: 12.5 10*3/uL — ABNORMAL HIGH (ref 4.0–10.5)
nRBC: 0 % (ref 0.0–0.2)

## 2021-01-11 MED ORDER — ALBUTEROL SULFATE HFA 108 (90 BASE) MCG/ACT IN AERS: 2.0000 | INHALATION_SPRAY | RESPIRATORY_TRACT | Status: DC | PRN

## 2021-01-11 NOTE — ED Notes (Signed)
Changed patient o2 tank

## 2021-01-11 NOTE — ED Triage Notes (Signed)
Patient recently discharged from [facility] for COPD exac, was discharged on 02, states he started on 1L [place] and is now up to 3L, is supposed to start using a BiPAP but has not gotten it yet. Daughter reports more short of breath and somnolent .

## 2021-01-12 ENCOUNTER — Encounter (HOSPITAL_COMMUNITY): Payer: Self-pay | Admitting: Family Medicine

## 2021-01-12 DIAGNOSIS — J441 Chronic obstructive pulmonary disease with (acute) exacerbation: Secondary | ICD-10-CM | POA: Diagnosis present

## 2021-01-12 DIAGNOSIS — Z794 Long term (current) use of insulin: Secondary | ICD-10-CM

## 2021-01-12 DIAGNOSIS — I152 Hypertension secondary to endocrine disorders: Secondary | ICD-10-CM

## 2021-01-12 DIAGNOSIS — E1165 Type 2 diabetes mellitus with hyperglycemia: Secondary | ICD-10-CM | POA: Diagnosis present

## 2021-01-12 DIAGNOSIS — Z85828 Personal history of other malignant neoplasm of skin: Secondary | ICD-10-CM | POA: Diagnosis not present

## 2021-01-12 DIAGNOSIS — I252 Old myocardial infarction: Secondary | ICD-10-CM | POA: Diagnosis not present

## 2021-01-12 DIAGNOSIS — D638 Anemia in other chronic diseases classified elsewhere: Secondary | ICD-10-CM | POA: Diagnosis present

## 2021-01-12 DIAGNOSIS — I248 Other forms of acute ischemic heart disease: Secondary | ICD-10-CM | POA: Diagnosis present

## 2021-01-12 DIAGNOSIS — Z79899 Other long term (current) drug therapy: Secondary | ICD-10-CM | POA: Diagnosis not present

## 2021-01-12 DIAGNOSIS — J9621 Acute and chronic respiratory failure with hypoxia: Secondary | ICD-10-CM | POA: Diagnosis present

## 2021-01-12 DIAGNOSIS — Z87891 Personal history of nicotine dependence: Secondary | ICD-10-CM | POA: Diagnosis not present

## 2021-01-12 DIAGNOSIS — E871 Hypo-osmolality and hyponatremia: Secondary | ICD-10-CM | POA: Diagnosis present

## 2021-01-12 DIAGNOSIS — E119 Type 2 diabetes mellitus without complications: Secondary | ICD-10-CM | POA: Diagnosis not present

## 2021-01-12 DIAGNOSIS — K219 Gastro-esophageal reflux disease without esophagitis: Secondary | ICD-10-CM | POA: Diagnosis present

## 2021-01-12 DIAGNOSIS — I509 Heart failure, unspecified: Secondary | ICD-10-CM | POA: Diagnosis not present

## 2021-01-12 DIAGNOSIS — I5032 Chronic diastolic (congestive) heart failure: Secondary | ICD-10-CM | POA: Diagnosis not present

## 2021-01-12 DIAGNOSIS — E1159 Type 2 diabetes mellitus with other circulatory complications: Secondary | ICD-10-CM

## 2021-01-12 DIAGNOSIS — D72828 Other elevated white blood cell count: Secondary | ICD-10-CM | POA: Diagnosis present

## 2021-01-12 DIAGNOSIS — Z8249 Family history of ischemic heart disease and other diseases of the circulatory system: Secondary | ICD-10-CM | POA: Diagnosis not present

## 2021-01-12 DIAGNOSIS — F419 Anxiety disorder, unspecified: Secondary | ICD-10-CM | POA: Diagnosis present

## 2021-01-12 DIAGNOSIS — I110 Hypertensive heart disease with heart failure: Secondary | ICD-10-CM | POA: Diagnosis present

## 2021-01-12 DIAGNOSIS — I2510 Atherosclerotic heart disease of native coronary artery without angina pectoris: Secondary | ICD-10-CM | POA: Diagnosis present

## 2021-01-12 DIAGNOSIS — J9622 Acute and chronic respiratory failure with hypercapnia: Secondary | ICD-10-CM

## 2021-01-12 DIAGNOSIS — Z20822 Contact with and (suspected) exposure to covid-19: Secondary | ICD-10-CM | POA: Diagnosis present

## 2021-01-12 DIAGNOSIS — I5033 Acute on chronic diastolic (congestive) heart failure: Secondary | ICD-10-CM | POA: Diagnosis present

## 2021-01-12 DIAGNOSIS — R600 Localized edema: Secondary | ICD-10-CM | POA: Diagnosis not present

## 2021-01-12 DIAGNOSIS — E785 Hyperlipidemia, unspecified: Secondary | ICD-10-CM | POA: Diagnosis present

## 2021-01-12 DIAGNOSIS — F410 Panic disorder [episodic paroxysmal anxiety] without agoraphobia: Secondary | ICD-10-CM | POA: Diagnosis present

## 2021-01-12 DIAGNOSIS — Z7984 Long term (current) use of oral hypoglycemic drugs: Secondary | ICD-10-CM | POA: Diagnosis not present

## 2021-01-12 DIAGNOSIS — Z7982 Long term (current) use of aspirin: Secondary | ICD-10-CM | POA: Diagnosis not present

## 2021-01-12 DIAGNOSIS — E876 Hypokalemia: Secondary | ICD-10-CM | POA: Diagnosis present

## 2021-01-12 LAB — I-STAT VENOUS BLOOD GAS, ED
Acid-Base Excess: 22 mmol/L — ABNORMAL HIGH (ref 0.0–2.0)
Bicarbonate: 49.7 mmol/L — ABNORMAL HIGH (ref 20.0–28.0)
Calcium, Ion: 1.13 mmol/L — ABNORMAL LOW (ref 1.15–1.40)
HCT: 33 % — ABNORMAL LOW (ref 39.0–52.0)
Hemoglobin: 11.2 g/dL — ABNORMAL LOW (ref 13.0–17.0)
O2 Saturation: 25 %
Patient temperature: 98.1
Potassium: 3.7 mmol/L (ref 3.5–5.1)
Sodium: 132 mmol/L — ABNORMAL LOW (ref 135–145)
TCO2: >50 mmol/L — ABNORMAL HIGH (ref 22–32)
pCO2, Ven: 72.7 mmHg (ref 44.0–60.0)
pH, Ven: 7.442 — ABNORMAL HIGH (ref 7.250–7.430)
pO2, Ven: 17 mmHg — CL (ref 32.0–45.0)

## 2021-01-12 LAB — HEPATIC FUNCTION PANEL
ALT: 30 U/L (ref 0–44)
AST: 21 U/L (ref 15–41)
Albumin: 3.3 g/dL — ABNORMAL LOW (ref 3.5–5.0)
Alkaline Phosphatase: 63 U/L (ref 38–126)
Bilirubin, Direct: <0.1 mg/dL (ref 0.0–0.2)
Total Bilirubin: 0.4 mg/dL (ref 0.3–1.2)
Total Protein: 6.2 g/dL — ABNORMAL LOW (ref 6.5–8.1)

## 2021-01-12 LAB — I-STAT ARTERIAL BLOOD GAS, ED
Acid-Base Excess: 17 mmol/L — ABNORMAL HIGH (ref 0.0–2.0)
Bicarbonate: 46.6 mmol/L — ABNORMAL HIGH (ref 20.0–28.0)
Calcium, Ion: 1.2 mmol/L (ref 1.15–1.40)
HCT: 32 % — ABNORMAL LOW (ref 39.0–52.0)
Hemoglobin: 10.9 g/dL — ABNORMAL LOW (ref 13.0–17.0)
O2 Saturation: 93 %
Patient temperature: 98.1
Potassium: 3.6 mmol/L (ref 3.5–5.1)
Sodium: 132 mmol/L — ABNORMAL LOW (ref 135–145)
TCO2: 49 mmol/L — ABNORMAL HIGH (ref 22–32)
pCO2 arterial: 90.3 mmHg (ref 32.0–48.0)
pH, Arterial: 7.319 — ABNORMAL LOW (ref 7.350–7.450)
pO2, Arterial: 76 mmHg — ABNORMAL LOW (ref 83.0–108.0)

## 2021-01-12 LAB — BASIC METABOLIC PANEL
Anion gap: 11 (ref 5–15)
Anion gap: 10 (ref 5–15)
Anion gap: 10 (ref 5–15)
BUN: 11 mg/dL (ref 8–23)
BUN: 10 mg/dL (ref 8–23)
BUN: 16 mg/dL (ref 8–23)
CO2: 38 mmol/L — ABNORMAL HIGH (ref 22–32)
CO2: 40 mmol/L — ABNORMAL HIGH (ref 22–32)
CO2: 39 mmol/L — ABNORMAL HIGH (ref 22–32)
Calcium: 8.7 mg/dL — ABNORMAL LOW (ref 8.9–10.3)
Calcium: 8.9 mg/dL (ref 8.9–10.3)
Calcium: 9.5 mg/dL (ref 8.9–10.3)
Chloride: 84 mmol/L — ABNORMAL LOW (ref 98–111)
Chloride: 83 mmol/L — ABNORMAL LOW (ref 98–111)
Chloride: 85 mmol/L — ABNORMAL LOW (ref 98–111)
Creatinine, Ser: 0.66 mg/dL (ref 0.61–1.24)
Creatinine, Ser: 0.52 mg/dL — ABNORMAL LOW (ref 0.61–1.24)
Creatinine, Ser: 0.85 mg/dL (ref 0.61–1.24)
GFR, Estimated: >60 mL/min (ref >60)
GFR, Estimated: >60 mL/min (ref >60)
GFR, Estimated: >60 mL/min (ref >60)
Glucose, Bld: 162 mg/dL — ABNORMAL HIGH (ref 70–99)
Glucose, Bld: 75 mg/dL (ref 70–99)
Glucose, Bld: 191 mg/dL — ABNORMAL HIGH (ref 70–99)
Potassium: 3.7 mmol/L (ref 3.5–5.1)
Potassium: 3.8 mmol/L (ref 3.5–5.1)
Potassium: 3.4 mmol/L — ABNORMAL LOW (ref 3.5–5.1)
Sodium: 133 mmol/L — ABNORMAL LOW (ref 135–145)
Sodium: 133 mmol/L — ABNORMAL LOW (ref 135–145)
Sodium: 134 mmol/L — ABNORMAL LOW (ref 135–145)

## 2021-01-12 LAB — GLUCOSE, CAPILLARY
Glucose-Capillary: 267 mg/dL — ABNORMAL HIGH (ref 70–99)
Glucose-Capillary: 235 mg/dL — ABNORMAL HIGH (ref 70–99)
Glucose-Capillary: 332 mg/dL — ABNORMAL HIGH (ref 70–99)

## 2021-01-12 LAB — CBC
HCT: 33.5 % — ABNORMAL LOW (ref 39.0–52.0)
Hemoglobin: 10.3 g/dL — ABNORMAL LOW (ref 13.0–17.0)
MCH: 28.5 pg (ref 26.0–34.0)
MCHC: 30.7 g/dL (ref 30.0–36.0)
MCV: 92.5 fL (ref 80.0–100.0)
Platelets: 198 10*3/uL (ref 150–400)
RBC: 3.62 MIL/uL — ABNORMAL LOW (ref 4.22–5.81)
RDW: 13.5 % (ref 11.5–15.5)
WBC: 10.1 10*3/uL (ref 4.0–10.5)
nRBC: 0 % (ref 0.0–0.2)

## 2021-01-12 LAB — PROCALCITONIN: Procalcitonin: <0.1 ng/mL

## 2021-01-12 LAB — CBG MONITORING, ED
Glucose-Capillary: 88 mg/dL (ref 70–99)
Glucose-Capillary: 148 mg/dL — ABNORMAL HIGH (ref 70–99)
Glucose-Capillary: 176 mg/dL — ABNORMAL HIGH (ref 70–99)

## 2021-01-12 LAB — SARS CORONAVIRUS 2 (TAT 6-24 HRS): SARS Coronavirus 2: NEGATIVE

## 2021-01-12 LAB — BRAIN NATRIURETIC PEPTIDE: B Natriuretic Peptide: 12.2 pg/mL (ref 0.0–100.0)

## 2021-01-12 LAB — MAGNESIUM: Magnesium: 2 mg/dL (ref 1.7–2.4)

## 2021-01-12 LAB — TROPONIN I (HIGH SENSITIVITY)
Troponin I (High Sensitivity): 222 ng/L (ref <18)
Troponin I (High Sensitivity): 211 ng/L (ref <18)

## 2021-01-12 MED ORDER — IPRATROPIUM-ALBUTEROL 0.5-2.5 (3) MG/3ML IN SOLN
3.0000 mL | Freq: Four times a day (QID) | RESPIRATORY_TRACT | Status: DC
  Administered 2021-01-12 (×2): 3 mL via RESPIRATORY_TRACT
  Filled 2021-01-12 (×2): qty 3

## 2021-01-12 MED ORDER — LOSARTAN POTASSIUM 50 MG PO TABS
50.0000 mg | ORAL_TABLET | Freq: Every day | ORAL | Status: DC
  Administered 2021-01-13 – 2021-01-17 (×5): 50 mg via ORAL
  Filled 2021-01-12 (×5): qty 1

## 2021-01-12 MED ORDER — ENOXAPARIN SODIUM 40 MG/0.4ML ~~LOC~~ SOLN
40.0000 mg | SUBCUTANEOUS | Status: DC
  Administered 2021-01-12 – 2021-01-13 (×2): 40 mg via SUBCUTANEOUS
  Filled 2021-01-12 (×2): qty 0.4

## 2021-01-12 MED ORDER — ONDANSETRON HCL 4 MG/2ML IJ SOLN: 4.0000 mg | Freq: Four times a day (QID) | INTRAMUSCULAR | Status: DC | PRN

## 2021-01-12 MED ORDER — ARTIFICIAL TEARS OPHTHALMIC OINT: TOPICAL_OINTMENT | OPHTHALMIC | Status: DC | PRN

## 2021-01-12 MED ORDER — ACETAZOLAMIDE 250 MG PO TABS
500.0000 mg | ORAL_TABLET | Freq: Once | ORAL | Status: AC
  Administered 2021-01-12: 500 mg via ORAL
  Filled 2021-01-12 (×3): qty 2

## 2021-01-12 MED ORDER — INSULIN ASPART 100 UNIT/ML ~~LOC~~ SOLN
0.0000 [IU] | SUBCUTANEOUS | Status: DC
  Administered 2021-01-12: 5 [IU] via SUBCUTANEOUS
  Administered 2021-01-12: 3 [IU] via SUBCUTANEOUS
  Administered 2021-01-12: 5 [IU] via SUBCUTANEOUS
  Administered 2021-01-13: 3 [IU] via SUBCUTANEOUS
  Administered 2021-01-13: 5 [IU] via SUBCUTANEOUS
  Administered 2021-01-13 (×2): 3 [IU] via SUBCUTANEOUS
  Administered 2021-01-13: 8 [IU] via SUBCUTANEOUS
  Administered 2021-01-13: 5 [IU] via SUBCUTANEOUS
  Administered 2021-01-13: 11 [IU] via SUBCUTANEOUS
  Administered 2021-01-14: 8 [IU] via SUBCUTANEOUS
  Administered 2021-01-14 (×2): 3 [IU] via SUBCUTANEOUS
  Administered 2021-01-14: 15 [IU] via SUBCUTANEOUS
  Administered 2021-01-15: 8 [IU] via SUBCUTANEOUS
  Administered 2021-01-15 (×2): 3 [IU] via SUBCUTANEOUS
  Administered 2021-01-15: 8 [IU] via SUBCUTANEOUS
  Administered 2021-01-15: 3 [IU] via SUBCUTANEOUS
  Administered 2021-01-15: 5 [IU] via SUBCUTANEOUS
  Administered 2021-01-16: 2 [IU] via SUBCUTANEOUS
  Administered 2021-01-16: 3 [IU] via SUBCUTANEOUS
  Administered 2021-01-16: 8 [IU] via SUBCUTANEOUS
  Administered 2021-01-16: 5 [IU] via SUBCUTANEOUS
  Administered 2021-01-17: 3 [IU] via SUBCUTANEOUS
  Administered 2021-01-17: 2 [IU] via SUBCUTANEOUS
  Administered 2021-01-17 (×2): 5 [IU] via SUBCUTANEOUS

## 2021-01-12 MED ORDER — REVEFENACIN 175 MCG/3ML IN SOLN
175.0000 ug | Freq: Every day | RESPIRATORY_TRACT | Status: DC
  Administered 2021-01-13 – 2021-01-17 (×5): 175 ug via RESPIRATORY_TRACT
  Filled 2021-01-12 (×6): qty 3

## 2021-01-12 MED ORDER — UMECLIDINIUM BROMIDE 62.5 MCG/INH IN AEPB
1.0000 | INHALATION_SPRAY | Freq: Every day | RESPIRATORY_TRACT | Status: DC
  Administered 2021-01-12: 12:00:00 1 via RESPIRATORY_TRACT
  Filled 2021-01-12: qty 7

## 2021-01-12 MED ORDER — FUROSEMIDE 10 MG/ML IJ SOLN
40.0000 mg | Freq: Every day | INTRAMUSCULAR | Status: DC
  Administered 2021-01-12 – 2021-01-14 (×3): 40 mg via INTRAVENOUS
  Filled 2021-01-12 (×3): qty 4

## 2021-01-12 MED ORDER — DIAZEPAM 2 MG PO TABS: 2.0000 mg | ORAL_TABLET | Freq: Three times a day (TID) | ORAL | Status: DC | PRN

## 2021-01-12 MED ORDER — METHYLPREDNISOLONE SODIUM SUCC 125 MG IJ SOLR
60.0000 mg | Freq: Four times a day (QID) | INTRAMUSCULAR | Status: AC
  Administered 2021-01-12 – 2021-01-13 (×4): 60 mg via INTRAVENOUS
  Filled 2021-01-12 (×4): qty 2

## 2021-01-12 MED ORDER — SODIUM CHLORIDE 0.9 % IV SOLN: 250.0000 mL | INTRAVENOUS | Status: DC | PRN

## 2021-01-12 MED ORDER — ARFORMOTEROL TARTRATE 15 MCG/2ML IN NEBU
15.0000 ug | INHALATION_SOLUTION | Freq: Two times a day (BID) | RESPIRATORY_TRACT | Status: DC
  Administered 2021-01-12 – 2021-01-17 (×10): 15 ug via RESPIRATORY_TRACT
  Filled 2021-01-12 (×14): qty 2

## 2021-01-12 MED ORDER — FUROSEMIDE 20 MG PO TABS: 40.0000 mg | ORAL_TABLET | Freq: Every day | ORAL | Status: DC

## 2021-01-12 MED ORDER — ALBUTEROL SULFATE HFA 108 (90 BASE) MCG/ACT IN AERS
4.0000 | INHALATION_SPRAY | RESPIRATORY_TRACT | Status: DC | PRN
  Filled 2021-01-12: qty 6.7

## 2021-01-12 MED ORDER — FUROSEMIDE 10 MG/ML IJ SOLN: 40.0000 mg | Freq: Once | INTRAMUSCULAR | Status: DC

## 2021-01-12 MED ORDER — DIAZEPAM 5 MG PO TABS: 5.0000 mg | ORAL_TABLET | Freq: Three times a day (TID) | ORAL | Status: DC | PRN

## 2021-01-12 MED ORDER — FLUTICASONE FUROATE-VILANTEROL 100-25 MCG/INH IN AEPB
1.0000 | INHALATION_SPRAY | Freq: Every day | RESPIRATORY_TRACT | Status: DC
  Administered 2021-01-12: 1 via RESPIRATORY_TRACT
  Filled 2021-01-12: qty 28

## 2021-01-12 MED ORDER — HYDROCODONE-ACETAMINOPHEN 5-325 MG PO TABS
1.0000 | ORAL_TABLET | ORAL | Status: DC | PRN
Start: 2021-01-12 — End: 2021-01-18
  Administered 2021-01-14: 1 via ORAL
  Filled 2021-01-12: qty 1

## 2021-01-12 MED ORDER — FUROSEMIDE 10 MG/ML IJ SOLN
40.0000 mg | Freq: Once | INTRAMUSCULAR | Status: AC
  Administered 2021-01-12: 40 mg via INTRAVENOUS
  Filled 2021-01-12: qty 4

## 2021-01-12 MED ORDER — ONDANSETRON HCL 4 MG PO TABS: 4.0000 mg | ORAL_TABLET | Freq: Four times a day (QID) | ORAL | Status: DC | PRN

## 2021-01-12 MED ORDER — INSULIN LISPRO (1 UNIT DIAL) 100 UNIT/ML (KWIKPEN): PEN_INJECTOR | SUBCUTANEOUS | 11 refills | Status: DC

## 2021-01-12 MED ORDER — ATORVASTATIN CALCIUM 80 MG PO TABS
80.0000 mg | ORAL_TABLET | Freq: Every evening | ORAL | Status: DC
  Administered 2021-01-12 – 2021-01-16 (×5): 80 mg via ORAL
  Filled 2021-01-12 (×5): qty 1

## 2021-01-12 MED ORDER — PANTOPRAZOLE SODIUM 40 MG PO TBEC
40.0000 mg | DELAYED_RELEASE_TABLET | Freq: Every day | ORAL | Status: DC
  Administered 2021-01-12 – 2021-01-16 (×5): 40 mg via ORAL
  Filled 2021-01-12 (×5): qty 1

## 2021-01-12 MED ORDER — ASPIRIN EC 81 MG PO TBEC
81.0000 mg | DELAYED_RELEASE_TABLET | Freq: Every day | ORAL | Status: DC
  Administered 2021-01-13 – 2021-01-17 (×5): 81 mg via ORAL
  Filled 2021-01-12 (×6): qty 1

## 2021-01-12 MED ORDER — ESCITALOPRAM OXALATE 10 MG PO TABS
10.0000 mg | ORAL_TABLET | Freq: Every day | ORAL | Status: DC
  Administered 2021-01-13 – 2021-01-17 (×5): 10 mg via ORAL
  Filled 2021-01-12 (×5): qty 1

## 2021-01-12 MED ORDER — METHYLPREDNISOLONE SODIUM SUCC 125 MG IJ SOLR
125.0000 mg | Freq: Once | INTRAMUSCULAR | Status: AC
  Administered 2021-01-12: 125 mg via INTRAVENOUS
  Filled 2021-01-12: qty 2

## 2021-01-12 MED ORDER — FLUTICASONE-UMECLIDIN-VILANT 100-62.5-25 MCG/INH IN AEPB: 1.0000 | INHALATION_SPRAY | Freq: Every morning | RESPIRATORY_TRACT | Status: DC

## 2021-01-12 MED ORDER — POLYETHYLENE GLYCOL 3350 17 G PO PACK: 17.0000 g | PACK | Freq: Every day | ORAL | Status: DC | PRN

## 2021-01-12 MED ORDER — DOXYCYCLINE HYCLATE 100 MG PO TABS: 100.0000 mg | ORAL_TABLET | Freq: Two times a day (BID) | ORAL | Status: DC

## 2021-01-12 MED ORDER — SODIUM CHLORIDE 0.9 % IV SOLN
500.0000 mg | INTRAVENOUS | Status: DC
  Administered 2021-01-12 – 2021-01-14 (×3): 500 mg via INTRAVENOUS
  Filled 2021-01-12 (×3): qty 500

## 2021-01-12 MED ORDER — ACETAMINOPHEN 650 MG RE SUPP: 650.0000 mg | Freq: Four times a day (QID) | RECTAL | Status: DC | PRN

## 2021-01-12 MED ORDER — PREDNISONE 20 MG PO TABS: 40.0000 mg | ORAL_TABLET | Freq: Every day | ORAL | Status: DC

## 2021-01-12 MED ORDER — ACETAMINOPHEN 325 MG PO TABS: 650.0000 mg | ORAL_TABLET | Freq: Four times a day (QID) | ORAL | Status: DC | PRN

## 2021-01-12 NOTE — Telephone Encounter (Signed)
Therapy changed to Humalog.

## 2021-01-12 NOTE — ED Notes (Signed)
Pt venous blood collected due to pt inability to remain still for collection.
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
## 2021-01-12 NOTE — ED Notes (Signed)
Difficulty gaining peripheral IV assess

## 2021-01-12 NOTE — Progress Notes (Signed)
RT attempted to obtain ABG X's 2 but was unable however was able to obtain venous blood. RN aware of venous gas results.
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
## 2021-01-12 NOTE — Progress Notes (Signed)
Pt critical ABG results given to RN.

## 2021-01-12 NOTE — Progress Notes (Signed)
Patient ID: Todd [name], male   DOB: 1961-06-07, 60 y.o.   MRN: [id number] Patient admitted early this morning for acute on chronic hypercarbic/hypoxic respiratory failure needing BiPAP, intravenous steroids and Lasix.  Patient seen and examined at bedside.  I have reviewed patient's medical records including this morning's H&P, current vitals, labs, medications myself.  He is still on BiPAP and still slow to respond to questions with significant lower extremity edema.  Continue Solu-Medrol.  We will switch oral Lasix to IV Lasix once a day 40 mg.  I have consulted pulmonary and cardiology as well.  Repeat a.m. labs.

## 2021-01-12 NOTE — Progress Notes (Signed)
Date and time results received: 01/12/21 253pm (use smartphrase ".now" to insert current time)  Test: troponin Critical Value: 222  Name of Provider Notified: Starla Link MD  Orders Received? Or Actions Taken?:No new orders
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
## 2021-01-13 DIAGNOSIS — J9622 Acute and chronic respiratory failure with hypercapnia: Secondary | ICD-10-CM | POA: Diagnosis not present

## 2021-01-13 DIAGNOSIS — I509 Heart failure, unspecified: Secondary | ICD-10-CM

## 2021-01-13 DIAGNOSIS — I5033 Acute on chronic diastolic (congestive) heart failure: Secondary | ICD-10-CM

## 2021-01-13 LAB — BASIC METABOLIC PANEL
Anion gap: 9 (ref 5–15)
BUN: 18 mg/dL (ref 8–23)
CO2: 35 mmol/L — ABNORMAL HIGH (ref 22–32)
Calcium: 8.9 mg/dL (ref 8.9–10.3)
Chloride: 87 mmol/L — ABNORMAL LOW (ref 98–111)
Creatinine, Ser: 0.78 mg/dL (ref 0.61–1.24)
GFR, Estimated: >60 mL/min (ref >60)
Glucose, Bld: 278 mg/dL — ABNORMAL HIGH (ref 70–99)
Potassium: 3.4 mmol/L — ABNORMAL LOW (ref 3.5–5.1)
Sodium: 131 mmol/L — ABNORMAL LOW (ref 135–145)

## 2021-01-13 LAB — GLUCOSE, CAPILLARY
Glucose-Capillary: 170 mg/dL — ABNORMAL HIGH (ref 70–99)
Glucose-Capillary: 172 mg/dL — ABNORMAL HIGH (ref 70–99)
Glucose-Capillary: 204 mg/dL — ABNORMAL HIGH (ref 70–99)
Glucose-Capillary: 227 mg/dL — ABNORMAL HIGH (ref 70–99)
Glucose-Capillary: 200 mg/dL — ABNORMAL HIGH (ref 70–99)
Glucose-Capillary: 254 mg/dL — ABNORMAL HIGH (ref 70–99)

## 2021-01-13 LAB — CBC
HCT: 33.2 % — ABNORMAL LOW (ref 39.0–52.0)
Hemoglobin: 10.9 g/dL — ABNORMAL LOW (ref 13.0–17.0)
MCH: 28.9 pg (ref 26.0–34.0)
MCHC: 32.8 g/dL (ref 30.0–36.0)
MCV: 88.1 fL (ref 80.0–100.0)
Platelets: 220 10*3/uL (ref 150–400)
RBC: 3.77 MIL/uL — ABNORMAL LOW (ref 4.22–5.81)
RDW: 13.2 % (ref 11.5–15.5)
WBC: 7.1 10*3/uL (ref 4.0–10.5)
nRBC: 0 % (ref 0.0–0.2)

## 2021-01-13 LAB — MAGNESIUM: Magnesium: 2.1 mg/dL (ref 1.7–2.4)

## 2021-01-13 MED ORDER — INSULIN GLARGINE 100 UNIT/ML ~~LOC~~ SOLN
5.0000 [IU] | Freq: Every day | SUBCUTANEOUS | Status: DC
  Administered 2021-01-13: 5 [IU] via SUBCUTANEOUS
  Filled 2021-01-13 (×3): qty 0.05

## 2021-01-13 MED ORDER — METHYLPREDNISOLONE SODIUM SUCC 40 MG IJ SOLR
40.0000 mg | Freq: Four times a day (QID) | INTRAMUSCULAR | Status: AC
  Administered 2021-01-13 – 2021-01-14 (×4): 40 mg via INTRAVENOUS
  Filled 2021-01-13 (×4): qty 1

## 2021-01-13 MED ORDER — POTASSIUM CHLORIDE CRYS ER 20 MEQ PO TBCR
20.0000 meq | EXTENDED_RELEASE_TABLET | ORAL | Status: AC
Start: 2021-01-13 — End: 2021-01-13
  Administered 2021-01-13 (×3): 20 meq via ORAL
  Filled 2021-01-13 (×3): qty 1

## 2021-01-13 MED ORDER — NEBIVOLOL HCL 5 MG PO TABS
5.0000 mg | ORAL_TABLET | Freq: Every day | ORAL | Status: DC
  Administered 2021-01-13 – 2021-01-17 (×5): 5 mg via ORAL
  Filled 2021-01-13 (×5): qty 1

## 2021-01-13 NOTE — Progress Notes (Signed)
Patient ID: Todd [name], male   DOB: Nov 01, 1961, 60 y.o.   MRN: [id number]  PROGRESS NOTE    Todd [name]  HVF:[id number] DOB: 11-Nov-1960 DOA: 01/11/2021 PCP: [name], MD   Brief Narrative:  60 y.o. male with medical history significant for hypertension, diabetes mellitus, anxiety, coronary artery disease, chronic diastolic CHF, COPD, and chronic hypoxic and hypercarbic respiratory failure and recent hospitalization at [facility] for worsening shortness of breath which was treated with diuretics, [name]  Was recommended that he use BiPAP while sleeping after discharge but he was not able to obtain BiPAP yet.  On presentation to the ED, patient was afebrile and saturating in the mid upper 90s on 3 L/min supplemental oxygen, mildly tachypneic, mildly tachycardic.  Chest x-ray showed chronic bibasilar scarring but no acute finding.  He was found to have mild leukocytosis and he was hypercapnic on ABG with PCO2 of 90 and pH of 7.32.  He was given intravenous Lasix, Solu-Medrol and started on BiPAP.  Pulmonary and cardiology were consulted.  Assessment & Plan:   Acute on chronic hypercapnic/hypoxic respiratory failure COPD exacerbation -Patient was recently treated at [facility] with steroids and diuretics and recommended BiPAP while sleeping on discharge but he has not been able to obtain BiPAP yet -He presented with worsening shortness of breath and was found to have hypercapnia with PCO2 of 90.  Chest x-ray showed chronic bibasilar scarring but no acute finding -COVID-19 test was negative on presentation -Initially required BiPAP on presentation.  Respiratory status is improving.  Currently on 2 L oxygen via nasal cannula.  Will involve care management team to help with obtaining BiPAP at home during  sleeping. -Pulmonary following. -Decree Solu-Medrol to 40 mg IV every 6 hours.  Continue nebs.  Continue Zithromax  Acute on chronic diastolic heart failure Mildly elevated troponin: Most likely from demand ischemia History of CAD -Fluid restriction.  Strict input and output with daily weights.  Continue IV Lasix for now.  Cardiology following.  Follow recommendations.  Continue losartan and beta-blocker. -Had a recent echo at [place] which had shown normal EF and mild diastolic dysfunction -Still has significant lower extremity swelling  Leukocytosis -Resolved  Mild hyponatremia -Monitor  Hypokalemia -Replace.  Repeat a.m. labs  Anemia of chronic disease -Possibly from heart failure and COPD.  Hemoglobin stable.  No signs of bleeding  Diabetes mellitus type 2 uncontrolled with hyperglycemia -Blood sugars still trending upwards.  Start small dose of long acting insulin.  Continue CBGs with SSI  Anxiety -Continue Lexapro.  Valium on hold for now.  Hypertension -Continue beta-blocker, Lasix and losartan  Generalized deconditioning -PT eval   DVT prophylaxis: Lovenox Code Status: Full Family Communication: None at bedside Disposition Plan: Status is: Inpatient  Remains inpatient appropriate because:Inpatient level of care appropriate due to severity of illness   Dispo: The patient is from: Home              Anticipated d/c is to: Home in 1 to 2 days if clinically improves and cleared by pulmonary and cardiology              Patient currently is not medically stable to d/c.   Difficult to place patient No   Consultants: Cardiology/pulmonary  Procedures: None  Antimicrobials: Zithromax   Subjective: Patient seen and examined at  bedside.  Poor historian.  More awake, feels better.  Still feels short of breath with minimal exertion.  Currently denies any chest pain, nausea, vomiting or fever.  Objective: Vitals:   01/13/21 0313 01/13/21 0542  01/13/21 0734 01/13/21 0741  BP: 109/81   (!) 141/93  Pulse: 77  86 85  Resp: 19  18 19   Temp: 98.1 F (36.7 C)   97.6 F (36.4 C)  TempSrc: Axillary   Oral  SpO2: 100%  100% 100%  Weight:  92.3 kg    Height:        Intake/Output Summary (Last 24 hours) at 01/13/2021 1037 Last data filed at 01/13/2021 0950 Gross per 24 hour  Intake 2160 ml  Output 4575 ml  Net -2415 ml   Filed Weights   01/11/21 2227 01/12/21 1449 01/13/21 0542  Weight: 98.4 kg 94.6 kg 92.3 kg    Examination:  General exam: Appears calm and comfortable.  Looks chronically ill.  Currently on 2 L oxygen via nasal cannula. Respiratory system: Bilateral decreased breath sounds at bases with some scattered crackles cardiovascular system: S1 & S2 heard, Rate controlled Gastrointestinal system: Abdomen is nondistended, soft and nontender. Normal bowel sounds heard. Extremities: No cyanosis, clubbing; lower extremity 2+ edema present Central nervous system: More awake today; slow to respond, poor historian.  No focal neurological deficits. Moving extremities Skin: No rashes, lesions or ulcers Psychiatry: Flat affect.    Data Reviewed: I have personally reviewed following labs and imaging studies  CBC: Recent Labs  Lab 01/07/21 1659 01/11/21 2237 01/12/21 0210 01/12/21 0336 01/12/21 0645 01/13/21 0031  WBC 11.2* 12.5*  --  10.1  --  7.1  NEUTROABS 9,061*  --   --   --   --   --   HGB 10.5* 10.6* 10.9* 10.3* 11.2* 10.9*  HCT 31.5* 34.2* 32.0* 33.5* 33.0* 33.2*  MCV 87.7 92.2  --  92.5  --  88.1  PLT 196 252  --  198  --  263   Basic Metabolic Panel: Recent Labs  Lab 01/07/21 1659 01/11/21 2237 01/12/21 0210 01/12/21 0336 01/12/21 0645 01/12/21 1728 01/13/21 0031  NA 133* 133* 132* 133* 132* 134* 131*  K 4.7 3.7 3.6 3.8 3.7 3.4* 3.4*  CL 87* 84*  --  83*  --  85* 87*  CO2 40* 38*  --  40*  --  39* 35*  GLUCOSE 122* 162*  --  75  --  191* 278*  BUN 8 11  --  10  --  16 18  CREATININE 0.48*  0.66  --  0.52*  --  0.85 0.78  CALCIUM 9.1 8.7*  --  8.9  --  9.5 8.9  MG  --   --   --   --   --  2.0 2.1   GFR: Estimated Creatinine Clearance: 103.7 mL/min (by C-G formula based on SCr of 0.78 mg/dL). Liver Function Tests: Recent Labs  Lab 01/07/21 1659 01/12/21 0231  AST 17 21  ALT 31 30  ALKPHOS  --  63  BILITOT 0.4 0.4  PROT 5.6* 6.2*  ALBUMIN  --  3.3*   No results for input(s): LIPASE, AMYLASE in the last 168 hours. No results for input(s): AMMONIA in the last 168 hours. Coagulation Profile: No results for input(s): INR, PROTIME in the last 168 hours. Cardiac Enzymes: No results for input(s): CKTOTAL, CKMB, CKMBINDEX, TROPONINI in the last 168 hours. BNP (last 3 results) No results for input(s): PROBNP  in the last 8760 hours. HbA1C: No results for input(s): HGBA1C in the last 72 hours. CBG: Recent Labs  Lab 01/12/21 1613 01/12/21 1923 01/12/21 2306 01/13/21 0312 01/13/21 0739  GLUCAP 267* 235* 332* 170* 172*   Lipid Profile: No results for input(s): CHOL, HDL, LDLCALC, TRIG, CHOLHDL, LDLDIRECT in the last 72 hours. Thyroid Function Tests: No results for input(s): TSH, T4TOTAL, FREET4, T3FREE, THYROIDAB in the last 72 hours. Anemia Panel: No results for input(s): VITAMINB12, FOLATE, FERRITIN, TIBC, IRON, RETICCTPCT in the last 72 hours. Sepsis Labs: Recent Labs  Lab 01/12/21 0336  PROCALCITON <0.10    Recent Results (from the past 240 hour(s))  SARS CORONAVIRUS 2 (TAT 6-24 HRS) Nasopharyngeal Nasopharyngeal Swab     Status: None   Collection Time: 01/12/21  3:01 AM   Specimen: Nasopharyngeal Swab  Result Value Ref Range Status   SARS Coronavirus 2 NEGATIVE NEGATIVE Final    Comment: (NOTE) SARS-CoV-2 target nucleic acids are NOT DETECTED.  The SARS-CoV-2 RNA is generally detectable in upper and lower respiratory specimens during the acute phase of infection. Negative results do not preclude SARS-CoV-2 infection, do not rule out co-infections with  other pathogens, and should not be used as the sole basis for treatment or other patient management decisions. Negative results must be combined with clinical observations, patient history, and epidemiological information. The expected result is Negative.  Fact Sheet for Patients: SugarRoll.be  Fact Sheet for Healthcare Providers: https://www.woods-mathews.com/  This test is not yet approved or cleared by the [place] FDA and  has been authorized for detection and/or diagnosis of SARS-CoV-2 by FDA under an Emergency Use Authorization (EUA). This EUA will remain  in effect (meaning this test can be used) for the duration of the COVID-19 declaration under Se ction 564(b)(1) of the Act, 21 U.S.C. section 360bbb-3(b)(1), unless the authorization is terminated or revoked sooner.  Performed at [facility], [place] [address] [place], [place] [address]          Radiology Studies: DG Chest 2 View  Result Date: 01/11/2021 CLINICAL DATA:  Shortness of breath EXAM: CHEST - 2 VIEW COMPARISON:  09/23/2020 FINDINGS: Cardiac shadow is within normal limits. Mild bibasilar scarring is noted stable from the prior exam. Pacing device is again seen and stable. No focal infiltrate or sizable effusion is seen. No bony abnormality is noted. IMPRESSION: Chronic scarring in the lung bases bilaterally. No acute abnormality noted. Electronically Signed   By: Inez Catalina M.D.   On: 01/11/2021 22:51        Scheduled Meds: . arformoterol  15 mcg Nebulization BID  . aspirin EC  81 mg Oral Daily  . atorvastatin  80 mg Oral QPM  . enoxaparin (LOVENOX) injection  40 mg Subcutaneous Q24H  . escitalopram  10 mg Oral Daily  . furosemide  40 mg Intravenous Daily  . insulin aspart  0-15 Units Subcutaneous Q4H  . losartan  50 mg Oral Daily  . methylPREDNISolone (SOLU-MEDROL) injection  40 mg Intravenous Q6H  . nebivolol  5 mg Oral Daily  . pantoprazole  40  mg Oral QHS  . potassium chloride  20 mEq Oral Q4H  . revefenacin  175 mcg Nebulization Daily   Continuous Infusions: . sodium chloride    . azithromycin 500 mg (01/13/21 0841)          Aline August, MD [facility] 01/13/2021, 10:37 AM

## 2021-01-13 NOTE — Evaluation (Signed)
Physical Therapy Evaluation Patient Details Name: Todd [name] MRN: [id number] DOB: [birth year] Today's Date: 01/13/2021   History of Present Illness  60yo male admitted 01/11/21 with SOB, BLE edema, and confusion. Admitted with COPD exacerbation, acute on chronic hypercarbic respiratory failure. PMH CAD, NSTEMI, DM, HLD, HTN, panic attack, tobacco abuse, ACL repair, carpal tunnel release, PPM placement, R TKA  Clinical Impression   Patient received in recliner, very pleasant but fairly anxious today. Unsure if he is a bit confused or just anxious, tells [place] it is 2012 and [facility], but follows cues/commands and well appropriate with most answers. Tolerated activity well but does require 2LPM to maintain O2 sats- see accompanying note for details. Left up in recliner with all needs met, nursing staff aware of pt status. Will likely benefit from HHPT f/u moving forward.     Follow Up Recommendations Home health PT    Equipment Recommendations  Rolling walker with 5" wheels    Recommendations for Other Services       Precautions / Restrictions Precautions Precautions: Fall;Other (comment) Precaution Comments: watch sats Restrictions Weight Bearing Restrictions: No      Mobility  Bed Mobility               General bed mobility comments: OOB in recliner upon entry    Transfers Overall transfer level: Needs assistance Equipment used: Rolling walker (2 wheeled) Transfers: Sit to/from Stand Sit to Stand: Min guard         General transfer comment: min guard for safety, cues for hand placement and sequencing  Ambulation/Gait Ambulation/Gait assistance: Min guard Gait Distance (Feet): 80 Feet Assistive device: Rolling walker (2 wheeled) Gait Pattern/deviations: Step-through pattern;Trunk flexed;Drifts right/left Gait velocity: decreased   General Gait Details: slow and steady with RW but tends to push it much too far away from himself, needed cues to correct  and maintain safe use of AD. VSS on 2LPM O2.  Stairs            Wheelchair Mobility    Modified Rankin (Stroke Patients Only)       Balance Overall balance assessment: Mild deficits observed, not formally tested                                           Pertinent Vitals/Pain Pain Assessment: No/denies pain    Home Living Family/patient expects to be discharged to:: Private residence Living Arrangements: Spouse/significant other;Children (2 sons live at home with him, 1 daughter lives across the street, 1 lives in [place]) Available Help at Discharge: Family;Available 24 hours/day Type of Home: House Home Access: Stairs to enter Entrance Stairs-Rails: None Entrance Stairs-Number of Steps: 2 Home Layout: Two level;Able to live on main level with bedroom/bathroom Home Equipment: Kasandra Knudsen - single point;Walker - 2 wheels      Prior Function Level of Independence: Independent with assistive device(s)         Comments: Using cane for ambulation     Hand Dominance        Extremity/Trunk Assessment   Upper Extremity Assessment Upper Extremity Assessment: Defer to OT evaluation    Lower Extremity Assessment Lower Extremity Assessment: Generalized weakness    Cervical / Trunk Assessment Cervical / Trunk Assessment: Kyphotic  Communication   Communication: No difficulties  Cognition Arousal/Alertness: Awake/alert Behavior During Therapy: WFL for tasks assessed/performed;Anxious Overall Cognitive Status: No family/caregiver present to  determine baseline cognitive functioning                                 General Comments: seems a bit anxious, sometimes makes odd comments but question if this was due to anxiety. Tells [place] it is 2012 and [facility] about [facility] hospitals.      General Comments      Exercises     Assessment/Plan    PT Assessment Patient needs continued PT services  PT Problem List Decreased strength;Decreased activity  tolerance;Decreased safety awareness;Decreased balance;Decreased mobility;Cardiopulmonary status limiting activity;Decreased coordination       PT Treatment Interventions DME instruction;Balance training;Gait training;Stair training;Patient/family education;Functional mobility training;Therapeutic activities;Therapeutic exercise    PT Goals (Current goals can be found in the Care Plan section)  Acute Rehab PT Goals Patient Stated Goal: feel better PT Goal Formulation: With patient Time For Goal Achievement: 01/27/21 Potential to Achieve Goals: Good    Frequency Min 3X/week   Barriers to discharge        Co-evaluation               AM-PAC PT "6 Clicks" Mobility  Outcome Measure Help needed turning from your back to your side while in a flat bed without using bedrails?: None Help needed moving from lying on your back to sitting on the side of a flat bed without using bedrails?: None Help needed moving to and from a bed to a chair (including a wheelchair)?: None Help needed standing up from a chair using your arms (e.g., wheelchair or bedside chair)?: None Help needed to walk in hospital room?: A Little Help needed climbing 3-5 steps with a railing? : A Little 6 Click Score: 22    End of Session Equipment Utilized During Treatment: Gait belt;Oxygen Activity Tolerance: Patient tolerated treatment well Patient left: in chair;with call bell/phone within reach Nurse Communication: Mobility status PT Visit Diagnosis: Muscle weakness (generalized) (M62.81);Unsteadiness on feet (R26.81)    Time: 0254-8628 PT Time Calculation (min) (ACUTE ONLY): 29 min   Charges:   PT Evaluation $PT Eval Moderate Complexity: 1 Mod PT Treatments $Gait Training: 8-22 mins       Windell Norfolk, DPT, PN1   Supplemental Physical Therapist [place]    Pager [phone] Acute Rehab Office [phone]

## 2021-01-13 NOTE — Progress Notes (Addendum)
Progress Note  Patient Name: Todd [name] Date of Encounter: 01/13/2021  [facility] Cardiologist: Peter Martinique, MD   Subjective   Sitting up in the chair, had breakfast. Feels his breathing is better today. [place] on at 2L but is tachypneic at rest  Inpatient Medications    Scheduled Meds: . arformoterol  15 mcg Nebulization BID  . aspirin EC  81 mg Oral Daily  . atorvastatin  80 mg Oral QPM  . enoxaparin (LOVENOX) injection  40 mg Subcutaneous Q24H  . escitalopram  10 mg Oral Daily  . furosemide  40 mg Intravenous Daily  . insulin aspart  0-15 Units Subcutaneous Q4H  . losartan  50 mg Oral Daily  . methylPREDNISolone (SOLU-MEDROL) injection  40 mg Intravenous Q6H  . nebivolol  5 mg Oral Daily  . pantoprazole  40 mg Oral QHS  . potassium chloride  20 mEq Oral Q4H  . revefenacin  175 mcg Nebulization Daily   Continuous Infusions: . sodium chloride    . azithromycin 500 mg (01/13/21 0841)   PRN Meds: sodium chloride, acetaminophen **OR** acetaminophen, albuterol, artificial tears, HYDROcodone-acetaminophen, ondansetron **OR** ondansetron (ZOFRAN) IV, polyethylene glycol   Vital Signs    Vitals:   01/13/21 0313 01/13/21 0542 01/13/21 0734 01/13/21 0741  BP: 109/81   (!) 141/93  Pulse: 77  86 85  Resp: 19  18 19   Temp: 98.1 F (36.7 C)   97.6 F (36.4 C)  TempSrc: Axillary   Oral  SpO2: 100%  100% 100%  Weight:  92.3 kg    Height:        Intake/Output Summary (Last 24 hours) at 01/13/2021 1011 Last data filed at 01/13/2021 0950 Gross per 24 hour  Intake 2410 ml  Output 4575 ml  Net -2165 ml   Last 3 Weights 01/13/2021 01/12/2021 01/11/2021  Weight (lbs) 203 lb 6.4 oz 208 lb 8.9 oz 216 lb 14.9 oz  Weight (kg) 92.262 kg 94.6 kg 98.4 kg      Telemetry    ST rates->100s - Personally Reviewed  ECG    No new tracing this morning  Physical Exam   GEN: Chronically Ill appearing, older male. Dyspneic at rest, wearing Esmeralda Neck: No JVD Cardiac: RRR, soft  systolic murmur, no rubs, or gallops.  Respiratory: Diminished with scattered rhonchi GI: Soft, nontender, non-distended  MS: 2+ LE pitting edema bilaterally; No deformity. Neuro:  Nonfocal  Psych: Normal affect   Labs    High Sensitivity Troponin:   Recent Labs  Lab 01/12/21 1316 01/12/21 1518  TROPONINIHS 222* 211*      Chemistry Recent Labs  Lab 01/07/21 1659 01/11/21 2237 01/12/21 0231 01/12/21 0336 01/12/21 0645 01/12/21 1728 01/13/21 0031  NA 133*   < >  --  133* 132* 134* 131*  K 4.7   < >  --  3.8 3.7 3.4* 3.4*  CL 87*   < >  --  83*  --  85* 87*  CO2 40*   < >  --  40*  --  39* 35*  GLUCOSE 122*   < >  --  75  --  191* 278*  BUN 8   < >  --  10  --  16 18  CREATININE 0.48*   < >  --  0.52*  --  0.85 0.78  CALCIUM 9.1   < >  --  8.9  --  9.5 8.9  PROT 5.6*  --  6.2*  --   --   --   --  ALBUMIN  --   --  3.3*  --   --   --   --   AST 17  --  21  --   --   --   --   ALT 31  --  30  --   --   --   --   ALKPHOS  --   --  63  --   --   --   --   BILITOT 0.4  --  0.4  --   --   --   --   GFRNONAA 118   < >  --  >60  --  >60 >60  GFRAA 137  --   --   --   --   --   --   ANIONGAP  --    < >  --  10  --  10 9   < > = values in this interval not displayed.     Hematology Recent Labs  Lab 01/11/21 2237 01/12/21 0210 01/12/21 0336 01/12/21 0645 01/13/21 0031  WBC 12.5*  --  10.1  --  7.1  RBC 3.71*  --  3.62*  --  3.77*  HGB 10.6*   < > 10.3* 11.2* 10.9*  HCT 34.2*   < > 33.5* 33.0* 33.2*  MCV 92.2  --  92.5  --  88.1  MCH 28.6  --  28.5  --  28.9  MCHC 31.0  --  30.7  --  32.8  RDW 13.6  --  13.5  --  13.2  PLT 252  --  198  --  220   < > = values in this interval not displayed.    BNP Recent Labs  Lab 01/12/21 0231  BNP 12.2     Lipid Panel     Component Value Date/Time   CHOL 124 12/01/2019 0910   TRIG 127 12/01/2019 0910   HDL 31 (L) 12/01/2019 0910   CHOLHDL 4.0 12/01/2019 0910   VLDL 45 (H) 02/13/2017 1111   LDLCALC 72 12/01/2019  0910   DDimer No results for input(s): DDIMER in the last 168 hours.   Radiology    DG Chest 2 View  Result Date: 01/11/2021 CLINICAL DATA:  Shortness of breath EXAM: CHEST - 2 VIEW COMPARISON:  09/23/2020 FINDINGS: Cardiac shadow is within normal limits. Mild bibasilar scarring is noted stable from the prior exam. Pacing device is again seen and stable. No focal infiltrate or sizable effusion is seen. No bony abnormality is noted. IMPRESSION: Chronic scarring in the lung bases bilaterally. No acute abnormality noted. Electronically Signed   By: Inez Catalina M.D.   On: 01/11/2021 22:51    Cardiac Studies   n/a  Patient Profile     60 y.o. male  with a hx of NSTEMI '14 with DES to mLAD, DM, HTN, HLD, COPD, GERD, sinus arrest s/p PPM and prior tobacco use who was seen for the evaluation of CHF at the request of Dr. Starla Link.  Assessment & Plan    1.  Acute on chronic hypoxic respiratory failure with history of COPD/Diastolic CHF: Had a recent admission to [facility] for COPD exacerbation as well as heart failure.  He was discharged home with instructions to start on BiPAP at night.  Had been unable to obtain equipment thus far.   -- Initially required BiPAP on admission but now has been weaned to nasal cannula.  BNP 12, chest x-ray with chronic scarring but no edema.   --  He has been followed by pulmonology as an outpatient for right middle lobe collapse.  Underwent bronchoscopy with biopsies which were benign back 09/2020 --Currently treated with IV steroids and antibiotics along with inhalers --Symptoms are multifactorial in nature with underlying respirator issues along with diastolic HF  2.  Acute on chronic diastolic CHF: Recent echocardiogram done 12/27/20 at [facility] showed EF of 65 to 70% with mild concentric hypertrophy and no regional wall motion normality.  Does have significant lower extremity edema on exam.  --Reports he has been on Lasix as needed historically, and most recently placed on 40  mg daily by PCP several days prior to admission --Net - 3.1L, weight is down 216>>203lbs --Continue with IV lasix 40mg  daily. Suspect he will need daily lasix at the time of discharge as he was only doing PRN PTA  3.  Elevated troponin with history of CAD: History of non-STEMI in 2014 with DES placed to the mid LAD, occluded RCA with collaterals.   -- Recent cath 09/2020 showed patent stents in the LAD with no change to occluded RCA with collaterals --High-sensitivity troponin 222>>221, he denies chest pain prior to admission.  Suspect demand ischemia in the setting of acute hypoxia and known occlusion of RCA  4.  Hypertension: PTA meds include coreg 12.5mg  BID and losartan 50mg  daily. -- Given his underlying lung disease would benefit from a more cardioselective BB --Will add Bystolic 5mg  daily  5. IDDM: Hgb A1c 7.8 -- on SSI  6. HLD: on Lipitor 80mg  daily  7. 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 Curt Bears 09/2020  8. Hypokalemia: 3.4+, suppl  For questions or updates, please contact [place] Please consult [email or web address] for contact info under        Signed, Reino Bellis, NP  01/13/2021, 10:11 AM      Patient seen and examined. Agree with assessment and plan. Breathing better. I/O since admission -3115 an 13 lb weight loss. Currently sinus rhythm in the 90s. NO ectopy.  Now on bystolic with improved cardioselectivity c/w carvedilol.  No chest pain; no angina. On azithromycin. WBC today 7.1.    Troy Sine, MD, [facility] 01/13/2021 10:44 AM

## 2021-01-13 NOTE — Progress Notes (Signed)
Inpatient Diabetes Program Recommendations  AACE/ADA: New Consensus Statement on Inpatient Glycemic Control   Target Ranges:  Prepandial:   less than 140 mg/dL      Peak postprandial:   less than 180 mg/dL (1-2 hours)      Critically ill patients:  140 - 180 mg/dL   Results for Rosten, CASANOVA SCHURMAN" (MRN [id number]) as of 01/13/2021 09:50  Ref. Range 01/12/2021 07:57 01/12/2021 11:42 01/12/2021 16:13 01/12/2021 19:23 01/12/2021 23:06 01/13/2021 03:12 01/13/2021 07:39  Glucose-Capillary Latest Ref Range: 70 - 99 mg/dL 148 (H) 176 (H) 267 (H) 235 (H) 332 (H) 170 (H) 172 (H)   Review of Glycemic Control  Diabetes history: DM2 Outpatient Diabetes medications: Glipizide 5 mg QAM, Metformin 1000 mg BID, Humalog 2-15 units QID Current orders for Inpatient glycemic control: Novolog 0-15 units Q4H; Solumedrol 40 mg Q6H  Inpatient Diabetes Program Recommendations:    Insulin: If steroids are continued, please consider ordering Lantus 5 units Q24H and Novolog 4 units TID with meals for meal coverage if patient eats at least 50% of meals.  Thanks, Barnie Alderman, RN, MSN, CDE Diabetes Coordinator Inpatient Diabetes Program [phone] (Team Pager from 8am to 5pm)

## 2021-01-13 NOTE — Progress Notes (Signed)
NAME:  Todd [name], MRN:  [id number], DOB:  May 02, 1961, LOS: 1 ADMISSION DATE:  01/11/2021, CONSULTATION DATE:  01/12/2021 REFERRING MD:  Aline August, MD CHIEF COMPLAINT:  Shortness of breath  Brief History:  Todd [name] is a 60 year male, former smoker with chronic hypoxemic respiratory failure, heart failure preserved EF and obstructive sleep apnea who presents to [facility] on 01/11/21 with increasing shortness of breath and somnolence.   He was recently discharged from [place] [facility] for COPD and heart failure exacerbation from 12/26/20 to 01/02/21. During the recent hospitalization, he was diuresed, had bilateral lower extremity venous ultrasound negative for DVT, and echocardiogram with normal EF and mild diastolic dysfunction, was diuresed, and treated with azithromycin, steroids, nebs, and BiPAP nightly.  He was hyponatremic during the recent hospitalization and HCTZ was discontinued.  It was recommended that he use BiPAP while sleeping after discharge but he has been unable to obtain this yet.  Patient's daughter called our office on 3/1 reporting he was getting worse again with increasing somnolence and low oxygen saturations. He was instructed to come to the ER for further evaluation. He was seen by his PCP on 01/07/21 who treated him for fluid overload with 40mg  of lasix daily and a prednisone taper.   He has been started on bipap in the ER. Given IV solumedrol and 80mg  of lasix total. Chest radiograph shows no acute abnormality but chronic basilar scarring bilaterally.    Past Medical History:  Chronic Hypoxemic/Hypercapnic Respiratory Failure COPD HFpEF Diabetes Mellitus Type II  Significant Hospital Events:  3/9 admitted, requiring Bipap in the ED  Consults:  PCCM  Procedures:    Significant Diagnostic Tests:  09/2020 ECHO: EF 84-66%, RV systolic function is normal and size is normal. Mildly dilated left atrium.   Micro Data:  3/9 MRSA screen 3/9 Covid Screen 3/9 Respiratory  viral panel -    Antimicrobials:  3/9 Azithromycin >>  Interim History / Subjective:  Patient is more awake this morning, sitting up in chair.   He reports taking hydrocodone as needed for leg pain and also takes diazepam as needed for anxiety.  He has stopped drinking alcohol 4 years ago.   He reports he is feeling better this morning.  Objective   Blood pressure (!) 141/93, pulse 85, temperature 97.6 F (36.4 C), temperature source Oral, resp. rate 19, height 5\' 7"  (1.702 m), weight 92.3 kg, SpO2 100 %.        Intake/Output Summary (Last 24 hours) at 01/13/2021 1124 Last data filed at 01/13/2021 0950 Gross per 24 hour  Intake 2160 ml  Output 4075 ml  Net -1915 ml   Filed Weights   01/11/21 2227 01/12/21 1449 01/13/21 0542  Weight: 98.4 kg 94.6 kg 92.3 kg    Examination: General: sitting up in tripod position in chair, no distress HENT: PERRL, moist mucous membranes  Lungs: diminished breath sounds throughout. No wheezing or rhonchi Cardiovascular: RRR, s1s2, no murmurs Abdomen: soft, non-tender, non-distended, BS+ Extremities: 2+ edema bilaterally, warm Neuro: moving all extremities, somnolent GU: no foley in place  Resolved Hospital Problem list     Assessment & Plan:   Acute on Chronic Hypercapnic and Hypoxemic Respiratory Failure COPD with exacerbation Heart Failure with Preserved EF  - Continue bipap qHS. Case management will need to work on getting patient a CPAP or bipap machine for home use. - I am concerned that his home narcotic and benzodiazepine regimen is adding to his issues with hypercapnia. Would recommend  he wean off the benzodiazepines if possible and other anxiolytic medications be use. - Continue azithromycin for 5 days and complete 5 days of prednisone 40mg  daily - Continue yupelri and brovana nebulizer treatments with as needed albuterol nebulizer treatments. Can transition back to trelegy inhaler as outpatient - No need for inhaled steroid  at this time as he is receiving IV steroids - He continues on 40mg  of lasix daily, net negative 3.1L since admission   Labs   CBC: Recent Labs  Lab 01/07/21 1659 01/11/21 2237 01/12/21 0210 01/12/21 0336 01/12/21 0645 01/13/21 0031  WBC 11.2* 12.5*  --  10.1  --  7.1  NEUTROABS 9,061*  --   --   --   --   --   HGB 10.5* 10.6* 10.9* 10.3* 11.2* 10.9*  HCT 31.5* 34.2* 32.0* 33.5* 33.0* 33.2*  MCV 87.7 92.2  --  92.5  --  88.1  PLT 196 252  --  198  --  836    Basic Metabolic Panel: Recent Labs  Lab 01/07/21 1659 01/11/21 2237 01/12/21 0210 01/12/21 0336 01/12/21 0645 01/12/21 1728 01/13/21 0031  NA 133* 133* 132* 133* 132* 134* 131*  K 4.7 3.7 3.6 3.8 3.7 3.4* 3.4*  CL 87* 84*  --  83*  --  85* 87*  CO2 40* 38*  --  40*  --  39* 35*  GLUCOSE 122* 162*  --  75  --  191* 278*  BUN 8 11  --  10  --  16 18  CREATININE 0.48* 0.66  --  0.52*  --  0.85 0.78  CALCIUM 9.1 8.7*  --  8.9  --  9.5 8.9  MG  --   --   --   --   --  2.0 2.1   GFR: Estimated Creatinine Clearance: 103.7 mL/min (by C-G formula based on SCr of 0.78 mg/dL). Recent Labs  Lab 01/07/21 1659 01/11/21 2237 01/12/21 0336 01/13/21 0031  PROCALCITON  --   --  <0.10  --   WBC 11.2* 12.5* 10.1 7.1    Liver Function Tests: Recent Labs  Lab 01/07/21 1659 01/12/21 0231  AST 17 21  ALT 31 30  ALKPHOS  --  63  BILITOT 0.4 0.4  PROT 5.6* 6.2*  ALBUMIN  --  3.3*   No results for input(s): LIPASE, AMYLASE in the last 168 hours. No results for input(s): AMMONIA in the last 168 hours.  ABG    Component Value Date/Time   PHART 7.319 (L) 01/12/2021 0210   PCO2ART 90.3 (HH) 01/12/2021 0210   PO2ART 76 (L) 01/12/2021 0210   HCO3 49.7 (H) 01/12/2021 0645   TCO2 >50 (H) 01/12/2021 0645   ACIDBASEDEF 3.5 (H) 01/11/2012 2250   O2SAT 25.0 01/12/2021 0645     Coagulation Profile: No results for input(s): INR, PROTIME in the last 168 hours.  Cardiac Enzymes: No results for input(s): CKTOTAL, CKMB,  CKMBINDEX, TROPONINI in the last 168 hours.  HbA1C: Hgb A1c MFr Bld  Date/Time Value Ref Range Status  01/07/2021 04:59 PM 7.8 (H) <5.7 % of total Hgb Final    Comment:    For someone without known diabetes, a hemoglobin A1c value of 6.5% or greater indicates that they may have  diabetes and this should be confirmed with a follow-up  test. . For someone with known diabetes, a value <7% indicates  that their diabetes is well controlled and a value  greater than or equal to 7% indicates suboptimal  control. A1c targets should be individualized based on  duration of diabetes, age, comorbid conditions, and  other considerations. . Currently, no consensus exists regarding use of hemoglobin A1c for diagnosis of diabetes for children. .   09/21/2020 02:57 AM 7.9 (H) 4.8 - 5.6 % Final    Comment:    (NOTE) Pre diabetes:          5.7%-6.4%  Diabetes:              >6.4%  Glycemic control for   <7.0% adults with diabetes     CBG: Recent Labs  Lab 01/12/21 1613 01/12/21 1923 01/12/21 2306 01/13/21 0312 01/13/21 0739  GLUCAP 267* 235* 332* 170* 172*   Freda Jackson, MD [facility] Office: [phone]   See Amion for Pager Details

## 2021-01-13 NOTE — Progress Notes (Signed)
Please see in addition to PT note:   SATURATION QUALIFICATIONS: (This note is used to comply with regulatory documentation for home oxygen)  Patient Saturations on Room Air at Rest = 87%  Patient Saturations on Room Air while Ambulating = DNT, desaturated at rest   Patient Saturations on 2 Liters of oxygen while Ambulating = 95%   Please briefly explain why patient needs home oxygen:desaturation on room air   Windell Norfolk, DPT, [place]    Pager [place] Office [phone]

## 2021-01-13 NOTE — Plan of Care (Signed)

## 2021-01-14 ENCOUNTER — Encounter (HOSPITAL_COMMUNITY): Payer: Self-pay | Admitting: Family Medicine

## 2021-01-14 ENCOUNTER — Ambulatory Visit: Payer: Medicare Other | Admitting: Family Medicine

## 2021-01-14 DIAGNOSIS — F419 Anxiety disorder, unspecified: Secondary | ICD-10-CM

## 2021-01-14 DIAGNOSIS — J9622 Acute and chronic respiratory failure with hypercapnia: Secondary | ICD-10-CM | POA: Diagnosis not present

## 2021-01-14 DIAGNOSIS — R600 Localized edema: Secondary | ICD-10-CM

## 2021-01-14 LAB — CBC
HCT: 33.2 % — ABNORMAL LOW (ref 39.0–52.0)
Hemoglobin: 10.3 g/dL — ABNORMAL LOW (ref 13.0–17.0)
MCH: 28.1 pg (ref 26.0–34.0)
MCHC: 31 g/dL (ref 30.0–36.0)
MCV: 90.7 fL (ref 80.0–100.0)
Platelets: 219 10*3/uL (ref 150–400)
RBC: 3.66 MIL/uL — ABNORMAL LOW (ref 4.22–5.81)
RDW: 13.2 % (ref 11.5–15.5)
WBC: 9.8 10*3/uL (ref 4.0–10.5)
nRBC: 0 % (ref 0.0–0.2)

## 2021-01-14 LAB — LIPID PANEL
Cholesterol: 139 mg/dL (ref 0–200)
HDL: 60 mg/dL (ref >40)
LDL Cholesterol: 63 mg/dL (ref 0–99)
Total CHOL/HDL Ratio: 2.3 RATIO
Triglycerides: 78 mg/dL (ref <150)
VLDL: 16 mg/dL (ref 0–40)

## 2021-01-14 LAB — BASIC METABOLIC PANEL
Anion gap: 6 (ref 5–15)
BUN: 18 mg/dL (ref 8–23)
CO2: 37 mmol/L — ABNORMAL HIGH (ref 22–32)
Calcium: 8.7 mg/dL — ABNORMAL LOW (ref 8.9–10.3)
Chloride: 89 mmol/L — ABNORMAL LOW (ref 98–111)
Creatinine, Ser: 0.65 mg/dL (ref 0.61–1.24)
GFR, Estimated: >60 mL/min (ref >60)
Glucose, Bld: 232 mg/dL — ABNORMAL HIGH (ref 70–99)
Potassium: 4.2 mmol/L (ref 3.5–5.1)
Sodium: 132 mmol/L — ABNORMAL LOW (ref 135–145)

## 2021-01-14 LAB — MAGNESIUM: Magnesium: 2.4 mg/dL (ref 1.7–2.4)

## 2021-01-14 LAB — GLUCOSE, CAPILLARY
Glucose-Capillary: 88 mg/dL (ref 70–99)
Glucose-Capillary: 198 mg/dL — ABNORMAL HIGH (ref 70–99)
Glucose-Capillary: 178 mg/dL — ABNORMAL HIGH (ref 70–99)
Glucose-Capillary: 389 mg/dL — ABNORMAL HIGH (ref 70–99)

## 2021-01-14 MED ORDER — AZITHROMYCIN 250 MG PO TABS
500.0000 mg | ORAL_TABLET | Freq: Every day | ORAL | Status: DC
  Administered 2021-01-15 – 2021-01-17 (×3): 500 mg via ORAL
  Filled 2021-01-14 (×3): qty 2

## 2021-01-14 MED ORDER — METHYLPREDNISOLONE SODIUM SUCC 40 MG IJ SOLR
40.0000 mg | Freq: Three times a day (TID) | INTRAMUSCULAR | Status: DC
  Administered 2021-01-14 – 2021-01-15 (×4): 40 mg via INTRAVENOUS
  Filled 2021-01-14 (×4): qty 1

## 2021-01-14 MED ORDER — INSULIN GLARGINE 100 UNIT/ML ~~LOC~~ SOLN
8.0000 [IU] | Freq: Every day | SUBCUTANEOUS | Status: DC
  Administered 2021-01-14 – 2021-01-17 (×4): 8 [IU] via SUBCUTANEOUS
  Filled 2021-01-14 (×4): qty 0.08

## 2021-01-14 NOTE — Progress Notes (Cosign Needed)
Patient continues to exhibit signs of hypercapnia associated with chronic respiratory failure secondary to severe COPD. Interruption or failure to provide NIV would quickly lead to exacerbation of the patient's condition, hospital admission, and likely harm to the patient. Continued use is preferred. The use of the NIV will treat patient's high PC02 levels and can reduce risk of exacerbations and future hospitalizations when used at night and during the day. BiLevel/RAD has been considered and ruled out as patient requires continuous alarms, backup battery, and portability which are not possible with BiLevel/RAD devices. Ventilation is required to decrease the work of breathing and improve pulmonary status. Interruption of ventilator support would lead to decline of health status. Patient is able to protect their airways and clear secretions on their own.    

## 2021-01-14 NOTE — Progress Notes (Signed)
This patient is receiving the antibiotic Azithromycin by the intravenous route. Based on criteria approved by the Pharmacy and Therapeutics Committee, and the Infectious Disease Division, the antibiotic(s) is / are being converted to equivalent oral dose form(s).   These criteria include:  Patient being treated for a respiratory tract infection, urinary tract infection, cellulitis, or Clostridium Difficile Associated Diarrhea  The patient is not neutropenic and does not exhibit a GI malabsorption state   The patient is eating (either orally or per tube) and/or has been taking other orally administered medications for at least 24 hours.  The patient is improving clinically (physician assessment and a 24-hour Tmax of  100.5? F).  If you have questions about this conversion, please contact the pharmacy department. Thank you.  Fara Olden, PharmD PGY-1 Pharmacy Resident 01/14/2021 2:58 PM Please see AMION for all pharmacy numbers

## 2021-01-14 NOTE — TOC Benefit Eligibility Note (Signed)
Transition of Care [facility]) Benefit Eligibility Note    Patient Details  Name: Todd [name] MRN: [id number] Date of Birth: [birth year]   Medication/Dose: 1.  FARXIGA    10 MG DAILY  Covered?: Yes  Tier: 3 Drug  Prescription Coverage Preferred Pharmacy: [facility] with Person/Company/Phone Number:: [employer]  @ [employer] #  [phone]  Co-Pay: $ 9.85  Prior Approval: No  Deductible: Met (OUT-OF-POCKET:UNMET)  Additional Notes: 2.  JARDIANCE  10 MG DAILY -  COVER- YES  , CO-PAY- $47.00 ,  TIER- 3 DRUG ,  P/A-NO    Memory Argue Phone Number: 01/14/2021, 11:23 AM

## 2021-01-14 NOTE — Progress Notes (Signed)
Physical Therapy Treatment Patient Details Name: Todd [name] MRN: [id number] DOB: 1961-11-04 Today's Date: 01/14/2021    History of Present Illness 60yo male admitted 01/11/21 with SOB, BLE edema, and confusion. Admitted with COPD exacerbation, acute on chronic hypercarbic respiratory failure. PMH CAD, NSTEMI, DM, HLD, HTN, panic attack, tobacco abuse, ACL repair, carpal tunnel release, PPM placement, R TKA    PT Comments    Patient progressing well towards PT goals. Improved ambulation distance with Min guard assist and use of RW for support; noted to have moments of unsteadiness especially during turns needing Min A to maintain balance. Sp02 dropped to low 80s on RA during activity; donned 2L and able to maintain Sp02 >88%. Able to maintain 02 at rest in high 90s. Weaned pt down to 1L at rest. Rn aware. Encouraged use of RW for support and energy conservation. Encouraged increasing activity over the weekend and walking with nursing a few times daily. Hopefully will be able to wean with increased activity. Will follow.    Follow Up Recommendations  Home health PT;Supervision - Intermittent     Equipment Recommendations  Rolling walker with 5" wheels    Recommendations for Other Services       Precautions / Restrictions Precautions Precautions: Fall;Other (comment) Precaution Comments: watch sats Restrictions Weight Bearing Restrictions: No    Mobility  Bed Mobility               General bed mobility comments: OOB in recliner upon entry    Transfers Overall transfer level: Needs assistance Equipment used: Rolling walker (2 wheeled) Transfers: Sit to/from Stand Sit to Stand: Min guard         General transfer comment: min guard for safety, cues for hand placement and sequencing. Stood from Youth worker.  Ambulation/Gait Ambulation/Gait assistance: Min guard;Min assist Gait Distance (Feet): 200 Feet Assistive device: Rolling walker (2 wheeled) Gait  Pattern/deviations: Step-through pattern;Trunk flexed;Drifts right/left Gait velocity: decreased   General Gait Details: Slow, mildly unsteady gait with unsteadiness during turns needing Min A due to minor LOB. SP02 dropped to low 80s on RA, donned 2L and able to maintain Sp02 >88%. 2 standing rest breaks.   Stairs             Wheelchair Mobility    Modified Rankin (Stroke Patients Only)       Balance Overall balance assessment: Needs assistance Sitting-balance support: Feet supported;No upper extremity supported Sitting balance-Leahy Scale: Good     Standing balance support: During functional activity Standing balance-Leahy Scale: Poor Standing balance comment: Requires UE support in standing, unsteady with turning                            Cognition Arousal/Alertness: Awake/alert Behavior During Therapy: WFL for tasks assessed/performed;Anxious Overall Cognitive Status: Within Functional Limits for tasks assessed                                 General Comments: appears [facility] for basic mobility tasks; slow processing at times.      Exercises      General Comments General comments (skin integrity, edema, etc.): Sp02 at rest on RA in high 90s, drops to low 80s with activity; donned 2L and able to maintain >88% throughout activity. RN made aware that pt weaned to 1L at rest.      Pertinent Vitals/Pain Pain Assessment: Faces Faces Pain Scale:  Hurts little more Pain Location: left hip, bil knees Pain Descriptors / Indicators: Sore;Aching Pain Intervention(s): Monitored during session;Repositioned    Home Living                      Prior Function            PT Goals (current goals can now be found in the care plan section) Progress towards PT goals: Progressing toward goals    Frequency    Min 3X/week      PT Plan Current plan remains appropriate    Co-evaluation              AM-PAC PT "6 Clicks" Mobility    Outcome Measure  Help needed turning from your back to your side while in a flat bed without using bedrails?: None Help needed moving from lying on your back to sitting on the side of a flat bed without using bedrails?: None Help needed moving to and from a bed to a chair (including a wheelchair)?: A Little Help needed standing up from a chair using your arms (e.g., wheelchair or bedside chair)?: A Little Help needed to walk in hospital room?: A Little Help needed climbing 3-5 steps with a railing? : A Little 6 Click Score: 20    End of Session Equipment Utilized During Treatment: Gait belt;Oxygen Activity Tolerance: Treatment limited secondary to medical complications (Comment) (drop in sp02) Patient left: in chair;with call bell/phone within reach Nurse Communication: Mobility status PT Visit Diagnosis: Muscle weakness (generalized) (M62.81);Unsteadiness on feet (R26.81)     Time: 7989-2119 PT Time Calculation (min) (ACUTE ONLY): 29 min  Charges:  $Gait Training: 8-22 mins $Therapeutic Activity: 8-22 mins                     Marisa Severin, PT, DPT Acute Rehabilitation Services Pager [phone] Office [place] 01/14/2021, 1:02 PM

## 2021-01-14 NOTE — Progress Notes (Signed)
Patient ID: Todd [name], male   DOB: 02/22/1961, 60 y.o.   MRN: [id number]  PROGRESS NOTE    Todd [name]  GGE:[id number] DOB: 1961-01-05 DOA: 01/11/2021 PCP: [name], MD   Brief Narrative:  60 y.o. male with medical history significant for hypertension, diabetes mellitus, anxiety, coronary artery disease, chronic diastolic CHF, COPD, and chronic hypoxic and hypercarbic respiratory failure and recent hospitalization at [facility] for worsening shortness of breath which was treated with diuretics, [name]  Was recommended that he use BiPAP while sleeping after discharge but he was not able to obtain BiPAP yet.  On presentation to the ED, patient was afebrile and saturating in the mid upper 90s on 3 L/min supplemental oxygen, mildly tachypneic, mildly tachycardic.  Chest x-ray showed chronic bibasilar scarring but no acute finding.  He was found to have mild leukocytosis and he was hypercapnic on ABG with PCO2 of 90 and pH of 7.32.  He was given intravenous Lasix, Solu-Medrol and started on BiPAP.  Pulmonary and cardiology were consulted.  Assessment & Plan:   Acute on chronic hypercapnic/hypoxic respiratory failure COPD exacerbation -Patient was recently treated at [facility] with steroids and diuretics and recommended BiPAP while sleeping on discharge but he has not been able to obtain BiPAP yet -He presented with worsening shortness of breath and was found to have hypercapnia with PCO2 of 90.  Chest x-ray showed chronic bibasilar scarring but no acute finding -COVID-19 test was negative on presentation -Initially required BiPAP on presentation.  Respiratory status is improving.  Currently on 1-2 L oxygen via nasal cannula during daytime. Care management team consulted to help with obtaining BiPAP at  home during sleeping. -Pulmonary following. -Decrease Solu-Medrol to 40 mg IV every 8 hours.  Continue nebs.  Continue Zithromax  Acute on chronic diastolic heart failure Mildly elevated troponin: Most likely from demand ischemia History of CAD -Fluid restriction.  Strict input and output with daily weights.  Negative balance of 4355 cc since admission.  Continue IV Lasix for now.  Cardiology following.  Follow recommendations.  Continue losartan and beta-blocker. -Had a recent echo at [place] which had shown normal EF and mild diastolic dysfunction  Leukocytosis -Resolved  Mild hyponatremia -Monitor  Hypokalemia -Improved  Anemia of chronic disease -Possibly from heart failure and COPD.  Hemoglobin stable.  No signs of bleeding  Diabetes mellitus type 2 uncontrolled with hyperglycemia -Blood sugars still trending upwards.  Continue Lantus.  Continue CBGs with SSI  Anxiety -Continue Lexapro.  Valium on hold for now.  Hypertension -Continue beta-blocker, Lasix and losartan  Generalized deconditioning -PT recommends home health PT.   DVT prophylaxis: Lovenox Code Status: Full Family Communication: None at bedside Disposition Plan: Status is: Inpatient  Remains inpatient appropriate because:Inpatient level of care appropriate due to severity of illness   Dispo: The patient is from: Home              Anticipated d/c is to: Home in 1 to 2 days if clinically improves and cleared by pulmonary and cardiology              Patient currently is not medically stable to d/c.   Difficult to place patient No   Consultants: Cardiology/pulmonary  Procedures: None  Antimicrobials: Zithromax   Subjective: Patient seen and examined at bedside.  Poor historian.  No overnight fever, vomiting, chest pain reported.  Still short of breath with exertion but feels better. Objective: Vitals:   01/13/21 2149 01/13/21 2302 01/14/21 0303 01/14/21 0506  BP: 110/75 97/72  107/72   Pulse: 80 76 67   Resp: 20 20 19    Temp:  98 F (36.7 C) 98.2 F (36.8 C)   TempSrc:  Oral Axillary   SpO2: 100% 96% 100%   Weight:    92.3 kg  Height:        Intake/Output Summary (Last 24 hours) at 01/14/2021 0719 Last data filed at 01/14/2021 0509 Gross per 24 hour  Intake 1620 ml  Output 3050 ml  Net -1430 ml   Filed Weights   01/12/21 1449 01/13/21 0542 01/14/21 0506  Weight: 94.6 kg 92.3 kg 92.3 kg    Examination:  General exam: No acute distress.  Looks chronically ill.  On 1 to 2 L oxygen via nasal cannula currently.  Tolerated BiPAP overnight.   Respiratory system: Decreased breath sounds at bases with basilar crackles cardiovascular system: Rate controlled, S1-S2 heard Gastrointestinal system: Abdomen is nondistended, soft and nontender.  Bowel sounds are heard  extremities: Bilateral lower extremity edema present; no clubbing  Central nervous system: Awake, answers some questions, slow to respond, poor historian.  No focal neurological deficits.  Moves extremities Skin: No obvious ecchymosis/lesions Psychiatry: Affect is flat    Data Reviewed: I have personally reviewed following labs and imaging studies  CBC: Recent Labs  Lab 01/07/21 1659 01/11/21 2237 01/12/21 0210 01/12/21 0336 01/12/21 0645 01/13/21 0031 01/14/21 0213  WBC 11.2* 12.5*  --  10.1  --  7.1 9.8  NEUTROABS 9,061*  --   --   --   --   --   --   HGB 10.5* 10.6* 10.9* 10.3* 11.2* 10.9* 10.3*  HCT 31.5* 34.2* 32.0* 33.5* 33.0* 33.2* 33.2*  MCV 87.7 92.2  --  92.5  --  88.1 90.7  PLT 196 252  --  198  --  220 329   Basic Metabolic Panel: Recent Labs  Lab 01/11/21 2237 01/12/21 0210 01/12/21 0336 01/12/21 0645 01/12/21 1728 01/13/21 0031 01/14/21 0213  NA 133*   < > 133* 132* 134* 131* 132*  K 3.7   < > 3.8 3.7 3.4* 3.4* 4.2  CL 84*  --  83*  --  85* 87* 89*  CO2 38*  --  40*  --  39* 35* 37*  GLUCOSE 162*  --  75  --  191* 278* 232*  BUN 11  --  10  --  16 18 18    CREATININE 0.66  --  0.52*  --  0.85 0.78 0.65  CALCIUM 8.7*  --  8.9  --  9.5 8.9 8.7*  MG  --   --   --   --  2.0 2.1 2.4   < > = values in this interval not displayed.   GFR: Estimated Creatinine Clearance: 103.7 mL/min (by C-G formula based on SCr of 0.65 mg/dL). Liver Function Tests: Recent Labs  Lab 01/07/21 1659 01/12/21 0231  AST 17 21  ALT 31 30  ALKPHOS  --  63  BILITOT 0.4 0.4  PROT 5.6* 6.2*  ALBUMIN  --  3.3*   No results for input(s): LIPASE, AMYLASE in the last 168 hours. No results for input(s): AMMONIA in the last 168 hours. Coagulation Profile: No results for input(s): INR, PROTIME in the last 168 hours. Cardiac Enzymes: No results for input(s):  CKTOTAL, CKMB, CKMBINDEX, TROPONINI in the last 168 hours. BNP (last 3 results) No results for input(s): PROBNP in the last 8760 hours. HbA1C: No results for input(s): HGBA1C in the last 72 hours. CBG: Recent Labs  Lab 01/13/21 0739 01/13/21 1135 01/13/21 1623 01/13/21 2108 01/13/21 2301  GLUCAP 172* 204* 227* 200* 254*   Lipid Profile: Recent Labs    01/14/21 0213  CHOL 139  HDL 60  LDLCALC 63  TRIG 78  CHOLHDL 2.3   Thyroid Function Tests: No results for input(s): TSH, T4TOTAL, FREET4, T3FREE, THYROIDAB in the last 72 hours. Anemia Panel: No results for input(s): VITAMINB12, FOLATE, FERRITIN, TIBC, IRON, RETICCTPCT in the last 72 hours. Sepsis Labs: Recent Labs  Lab 01/12/21 0336  PROCALCITON <0.10    Recent Results (from the past 240 hour(s))  SARS CORONAVIRUS 2 (TAT 6-24 HRS) Nasopharyngeal Nasopharyngeal Swab     Status: None   Collection Time: 01/12/21  3:01 AM   Specimen: Nasopharyngeal Swab  Result Value Ref Range Status   SARS Coronavirus 2 NEGATIVE NEGATIVE Final    Comment: (NOTE) SARS-CoV-2 target nucleic acids are NOT DETECTED.  The SARS-CoV-2 RNA is generally detectable in upper and lower respiratory specimens during the acute phase of infection. Negative results do not  preclude SARS-CoV-2 infection, do not rule out co-infections with other pathogens, and should not be used as the sole basis for treatment or other patient management decisions. Negative results must be combined with clinical observations, patient history, and epidemiological information. The expected result is Negative.  Fact Sheet for Patients: SugarRoll.be  Fact Sheet for Healthcare Providers: https://www.woods-mathews.com/  This test is not yet approved or cleared by the [place] FDA and  has been authorized for detection and/or diagnosis of SARS-CoV-2 by FDA under an Emergency Use Authorization (EUA). This EUA will remain  in effect (meaning this test can be used) for the duration of the COVID-19 declaration under Se ction 564(b)(1) of the Act, 21 U.S.C. section 360bbb-3(b)(1), unless the authorization is terminated or revoked sooner.  Performed at [facility], [place] [address] [place], [place] [address]          Radiology Studies: No results found.      Scheduled Meds: . arformoterol  15 mcg Nebulization BID  . aspirin EC  81 mg Oral Daily  . atorvastatin  80 mg Oral QPM  . escitalopram  10 mg Oral Daily  . furosemide  40 mg Intravenous Daily  . insulin aspart  0-15 Units Subcutaneous Q4H  . insulin glargine  5 Units Subcutaneous Daily  . losartan  50 mg Oral Daily  . nebivolol  5 mg Oral Daily  . pantoprazole  40 mg Oral QHS  . revefenacin  175 mcg Nebulization Daily   Continuous Infusions: . sodium chloride    . azithromycin 500 mg (01/13/21 0841)          Aline August, MD [facility] 01/14/2021, 7:19 AM

## 2021-01-14 NOTE — Progress Notes (Signed)
Progress Note  Patient Name: Todd [name] Date of Encounter: 01/14/2021  [facility] Cardiologist: Peter Martinique, MD   Subjective   Sitting up in the chair, had breakfast. Feels his breathing is better today. Elsie on at 2L but is tachypneic at rest  Inpatient Medications    Scheduled Meds: . arformoterol  15 mcg Nebulization BID  . aspirin EC  81 mg Oral Daily  . atorvastatin  80 mg Oral QPM  . escitalopram  10 mg Oral Daily  . furosemide  40 mg Intravenous Daily  . insulin aspart  0-15 Units Subcutaneous Q4H  . insulin glargine  8 Units Subcutaneous Daily  . losartan  50 mg Oral Daily  . methylPREDNISolone (SOLU-MEDROL) injection  40 mg Intravenous Q8H  . nebivolol  5 mg Oral Daily  . pantoprazole  40 mg Oral QHS  . revefenacin  175 mcg Nebulization Daily   Continuous Infusions: . sodium chloride    . azithromycin 500 mg (01/14/21 0811)   PRN Meds: sodium chloride, acetaminophen **OR** acetaminophen, albuterol, artificial tears, HYDROcodone-acetaminophen, ondansetron **OR** ondansetron (ZOFRAN) IV, polyethylene glycol   Vital Signs    Vitals:   01/14/21 0739 01/14/21 0742 01/14/21 0755 01/14/21 1137  BP:   114/60 (!) 120/98  Pulse:   66 68  Resp:   20 20  Temp:   98.6 F (37 C) 97.8 F (36.6 C)  TempSrc:   Oral Oral  SpO2: 100% 100% 98% 99%  Weight:      Height:        Intake/Output Summary (Last 24 hours) at 01/14/2021 1214 Last data filed at 01/14/2021 1100 Gross per 24 hour  Intake 1080 ml  Output 2125 ml  Net -1045 ml   Last 3 Weights 01/14/2021 01/13/2021 01/12/2021  Weight (lbs) 203 lb 6.3 oz 203 lb 6.4 oz 208 lb 8.9 oz  Weight (kg) 92.26 kg 92.262 kg 94.6 kg      Telemetry    ST rates->100s - Personally Reviewed  ECG    No new tracing this morning  Physical Exam   GEN:  Breathing better; not dyspneic at rest Neck: No JVD Cardiac: RRR, soft systolic murmur, no rubs, or gallops.  Respiratory: decreased BS, no wheezing GI: Soft,  nontender, non-distended  MS: 1+ LE pitting edema bilaterally; No deformity. Neuro:  Nonfocal  Psych: Normal affect   Labs    High Sensitivity Troponin:   Recent Labs  Lab 01/12/21 1316 01/12/21 1518  TROPONINIHS 222* 211*      Chemistry Recent Labs  Lab 01/07/21 1659 01/11/21 2237 01/12/21 0231 01/12/21 0336 01/12/21 1728 01/13/21 0031 01/14/21 0213  NA 133*   < >  --    < > 134* 131* 132*  K 4.7   < >  --    < > 3.4* 3.4* 4.2  CL 87*   < >  --    < > 85* 87* 89*  CO2 40*   < >  --    < > 39* 35* 37*  GLUCOSE 122*   < >  --    < > 191* 278* 232*  BUN 8   < >  --    < > 16 18 18   CREATININE 0.48*   < >  --    < > 0.85 0.78 0.65  CALCIUM 9.1   < >  --    < > 9.5 8.9 8.7*  PROT 5.6*  --  6.2*  --   --   --   --  ALBUMIN  --   --  3.3*  --   --   --   --   AST 17  --  21  --   --   --   --   ALT 31  --  30  --   --   --   --   ALKPHOS  --   --  63  --   --   --   --   BILITOT 0.4  --  0.4  --   --   --   --   GFRNONAA 118   < >  --    < > >60 >60 >60  GFRAA 137  --   --   --   --   --   --   ANIONGAP  --    < >  --    < > 10 9 6    < > = values in this interval not displayed.     Hematology Recent Labs  Lab 01/12/21 0336 01/12/21 0645 01/13/21 0031 01/14/21 0213  WBC 10.1  --  7.1 9.8  RBC 3.62*  --  3.77* 3.66*  HGB 10.3* 11.2* 10.9* 10.3*  HCT 33.5* 33.0* 33.2* 33.2*  MCV 92.5  --  88.1 90.7  MCH 28.5  --  28.9 28.1  MCHC 30.7  --  32.8 31.0  RDW 13.5  --  13.2 13.2  PLT 198  --  220 219    BNP Recent Labs  Lab 01/12/21 0231  BNP 12.2     Lipid Panel     Component Value Date/Time   CHOL 139 01/14/2021 0213   TRIG 78 01/14/2021 0213   HDL 60 01/14/2021 0213   CHOLHDL 2.3 01/14/2021 0213   VLDL 16 01/14/2021 0213   LDLCALC 63 01/14/2021 0213   LDLCALC 72 12/01/2019 0910   DDimer No results for input(s): DDIMER in the last 168 hours.   Radiology    No results found.  Cardiac Studies   n/a  Patient Profile     60 y.o. male  with  a hx of NSTEMI '14 with DES to mLAD, DM, HTN, HLD, COPD, GERD, sinus arrest s/p PPM and prior tobacco use who was seen for the evaluation of CHF at the request of Dr. Starla Link.  Assessment & Plan    1.  Acute on chronic hypoxic respiratory failure with history of COPD/Diastolic CHF: Had a recent admission to [facility] for COPD exacerbation as well as heart failure.  He was discharged home with instructions to start on BiPAP at night.  Had been unable to obtain equipment thus far.   -- Initially required BiPAP on admission but now has been weaned to nasal cannula.  BNP 12, chest x-ray with chronic scarring but no edema.   -- He has been followed by pulmonology as an outpatient for right middle lobe collapse.  Underwent bronchoscopy with biopsies which were benign back 09/2020 --Currently treated with IV steroids and antibiotics along with inhalers --Symptoms are multifactorial in nature with underlying respirator issues along with diastolic HF BiPAP to be set up at dc per pulmonary  2.  Acute on chronic diastolic CHF: Ehocardi[facility] on 12/27/20 at [facility] showed EF of 65 to 70% with mild concentric hypertrophy and no regional wall motion normality.  Does have significant lower extremity edema on exam.  --Reports he has been on Lasix as needed historically, and most recently placed on 40 mg daily by PCP several days prior to  admission -- I/O -4625 since admission; weight decreased to 92.3 today --Continue with IV lasix 40mg  daily. Suspect he will need daily lasix at the time of discharge as he was only doing PRN PTA  3.  Elevated troponin with history of CAD: History of non-STEMI in 2014 with DES placed to the mid LAD, occluded RCA with collaterals.   -- Recent cath 09/2020 showed patent stents in the LAD with no change to occluded RCA with collaterals --High-sensitivity troponin 222>>221, he denies chest pain prior to admission.  Suspect demand ischemia in the setting of acute hypoxia and known occlusion of  RCA  4.  Hypertension: PTA meds include coreg 12.5mg  BID and losartan 50mg  daily. -- Given his underlying lung disease would benefit from a more cardioselective BB --Bystolic was added for cardioselectivity , no wheezing, HR in the 60s  5. IDDM: Hgb A1c 7.8 -- on SSI  6. HLD: on Lipitor 80mg  daily  7. 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 Curt Bears 09/2020  8. Hypokalemia: 3.4 >> 4.2  For questions or updates, please contact [place] Please consult [email or web address] for contact info under    Will sign off, call if additional assistance is needed    Signed, Shelva Majestic, MD  01/14/2021, 12:14 PM

## 2021-01-14 NOTE — Progress Notes (Addendum)
NAME:  Todd [name], MRN:  [id number], DOB:  [birth year], LOS: 2 ADMISSION DATE:  01/11/2021, CONSULTATION DATE:  01/12/2021 REFERRING MD:  Aline August, MD CHIEF COMPLAINT:  Shortness of breath  Brief History:  Todd [name] is a 60 year male, former smoker ( Quit 11/17/2020 with a 74 pack year smoking history) with chronic hypoxemic respiratory failure, heart failure preserved EF and obstructive sleep apnea who presents to [facility] on 01/11/21 with increasing shortness of breath and somnolence.   He was recently discharged from [place] center for COPD and heart failure exacerbation from 12/26/20 to 01/02/21. During the recent hospitalization, he was diuresed, had bilateral lower extremity venous ultrasound negative for DVT, and echocardiogram with normal EF and mild diastolic dysfunction, was diuresed, and treated with azithromycin, steroids, nebs, and BiPAP nightly.  He was hyponatremic during the recent hospitalization and HCTZ was discontinued.  It was recommended that he use BiPAP while sleeping after discharge but he has been unable to obtain this yet.  Patient's daughter called our office on 3/1 reporting he was getting worse again with increasing somnolence and low oxygen saturations. He was instructed to come to the ER for further evaluation. He was seen by his PCP on 01/07/21 who treated him for fluid overload with 40mg  of lasix daily and a prednisone taper.   He has been started on bipap in the ER. Given IV solumedrol and 80mg  of lasix total. Chest radiograph shows no acute abnormality but chronic basilar scarring bilaterally.    Past Medical History:  Chronic Hypoxemic/Hypercapnic Respiratory Failure COPD HFpEF Diabetes Mellitus Type II  Significant Hospital Events:  3/9 admitted, requiring Bipap in the ED  Consults:  PCCM  Procedures:    Significant Diagnostic Tests:  09/2020 ECHO: EF 75-91%, RV systolic function is normal and size is normal. Mildly dilated left atrium.   Micro  Data:  3/9 MRSA screen>> Negative 3/9 Covid Screen>> Negative 3/9 Respiratory viral panel - Negative for Flu A and Flu B.   Antimicrobials:  3/9 Azithromycin >>  Interim History / Subjective:  Patient is more awake this morning, sitting up in chair.  Can be slow to respond at times, but unsure if this is his baseline  He reports taking hydrocodone as needed for leg pain and also takes diazepam as needed for anxiety.  He has stopped drinking alcohol 4 years ago.   He states he does not take his depression pill when he takes his pain medication, as the twi together " almost kill him"  He reports he is feeling better this morning. States breathing is better, remains on 2 L [place]  Net negative 4.6 L ( 3 L UO last 24 hours) Na 132. Creatinine is 0.65 Glucose remains high HGB 10.3, WBC 9.8, platelets 219  Objective   Blood pressure 114/60, pulse 66, temperature 98.6 F (37 C), temperature source Oral, resp. rate 20, height 5\' 7"  (1.702 m), weight 92.3 kg, SpO2 98 %.        Intake/Output Summary (Last 24 hours) at 01/14/2021 1118 Last data filed at 01/14/2021 1100 Gross per 24 hour  Intake 1140 ml  Output 2650 ml  Net -1510 ml   Filed Weights   01/12/21 1449 01/13/21 0542 01/14/21 0506  Weight: 94.6 kg 92.3 kg 92.3 kg    Examination: General: sitting up in chair, no distress, appropriate HENT: PERRL, moist mucous membranes , No LAD, No JVD Lungs: Bilateral chest excursion, diminished breath sounds throughout. Prolonged expiratory phase,  No wheezing  or rhonchi Cardiovascular: RRR, s1s2, no murmurs Abdomen: soft, non-tender, non-distended, BS+, Body mass index is 31.86 kg/m. Extremities: 2+ edema bilaterally, warm, brisk refill, right knee surgical scar, no obvious deformities Neuro: moving all extremities, slow to respond at times, but A&O x 3, MAE x 4 GU: no foley in place  Resolved Hospital Problem list     Assessment & Plan:   Acute on Chronic Hypercapnic and  Hypoxemic Respiratory Failure COPD with exacerbation Heart Failure with Preserved EF  - Continue bipap qHS. Case management will need to work on getting patient a CPAP or bipap machine for home use. - Agree his home narcotic and benzodiazepine regimen is adding to his issues with hypercapnia. Would recommend he wean off the benzodiazepines if possible and other anxiolytic medications be use. - Needs education on medication regimen, holds Prozac on days he needs to take pain meds.  - Continue azithromycin for 5 days and complete 5 days of prednisone 40mg  daily - Continue yupelri and brovana nebulizer treatments with as needed albuterol nebulizer treatments. Can transition back to trelegy inhaler as outpatient with rescue inhaler as needed. - No need for inhaled steroid at this time as he is receiving IV steroids - He continues on 40mg  of lasix daily, net negative 4.6 L since admission - Will need OP follow up with Pulmonary office at [place]  Transition of Care Pt needs Trilogy at discharge. Patient's chronic respiratory failure due to COPD with chronic CO2 retention, which is life threatening and driving hospital admissions. .  Previous  ABG's, and ABG's this admission ( CO2 of 72.7)  have documented high PCO2 and spirometry reveals severe obstructive ventilatory defect.  Patient would benefit from non-invasive ventilation.  Without this therapy, the patient is at high risk of ending up with worsening symptoms, worsened respiratory failure, need for ER visits and/or recurrent hospitalizations.  Bilevel device is unable to adequately support patient's nocturnal ventilation needs.  Patient would benefit from NIV therapy with set tidal volumes and pressure.  Current Trelegy/ BiPAP   pressures are: IPAP of 10 EPAP of 5 Rate of 10 40% I time of 0.9 I do not see a PS setting   Needs Trilogy at discharge>> Needs medication education, weaning form pain meds which will continue to exacerbate his  respiratory failure. Will need continued aggressive management of his heart failure  Labs   CBC: Recent Labs  Lab 01/07/21 1659 01/11/21 2237 01/12/21 0210 01/12/21 0336 01/12/21 0645 01/13/21 0031 01/14/21 0213  WBC 11.2* 12.5*  --  10.1  --  7.1 9.8  NEUTROABS 9,061*  --   --   --   --   --   --   HGB 10.5* 10.6* 10.9* 10.3* 11.2* 10.9* 10.3*  HCT 31.5* 34.2* 32.0* 33.5* 33.0* 33.2* 33.2*  MCV 87.7 92.2  --  92.5  --  88.1 90.7  PLT 196 252  --  198  --  220 353    Basic Metabolic Panel: Recent Labs  Lab 01/11/21 2237 01/12/21 0210 01/12/21 0336 01/12/21 0645 01/12/21 1728 01/13/21 0031 01/14/21 0213  NA 133*   < > 133* 132* 134* 131* 132*  K 3.7   < > 3.8 3.7 3.4* 3.4* 4.2  CL 84*  --  83*  --  85* 87* 89*  CO2 38*  --  40*  --  39* 35* 37*  GLUCOSE 162*  --  75  --  191* 278* 232*  BUN 11  --  10  --  16 18 18   CREATININE 0.66  --  0.52*  --  0.85 0.78 0.65  CALCIUM 8.7*  --  8.9  --  9.5 8.9 8.7*  MG  --   --   --   --  2.0 2.1 2.4   < > = values in this interval not displayed.   GFR: Estimated Creatinine Clearance: 103.7 mL/min (by C-G formula based on SCr of 0.65 mg/dL). Recent Labs  Lab 01/11/21 2237 01/12/21 0336 01/13/21 0031 01/14/21 0213  PROCALCITON  --  <0.10  --   --   WBC 12.5* 10.1 7.1 9.8    Liver Function Tests: Recent Labs  Lab 01/07/21 1659 01/12/21 0231  AST 17 21  ALT 31 30  ALKPHOS  --  63  BILITOT 0.4 0.4  PROT 5.6* 6.2*  ALBUMIN  --  3.3*   No results for input(s): LIPASE, AMYLASE in the last 168 hours. No results for input(s): AMMONIA in the last 168 hours.  ABG    Component Value Date/Time   PHART 7.319 (L) 01/12/2021 0210   PCO2ART 90.3 (HH) 01/12/2021 0210   PO2ART 76 (L) 01/12/2021 0210   HCO3 49.7 (H) 01/12/2021 0645   TCO2 >50 (H) 01/12/2021 0645   ACIDBASEDEF 3.5 (H) 01/11/2012 2250   O2SAT 25.0 01/12/2021 0645     Coagulation Profile: No results for input(s): INR, PROTIME in the last 168  hours.  Cardiac Enzymes: No results for input(s): CKTOTAL, CKMB, CKMBINDEX, TROPONINI in the last 168 hours.  HbA1C: Hgb A1c MFr Bld  Date/Time Value Ref Range Status  01/07/2021 04:59 PM 7.8 (H) <5.7 % of total Hgb Final    Comment:    For someone without known diabetes, a hemoglobin A1c value of 6.5% or greater indicates that they may have  diabetes and this should be confirmed with a follow-up  test. . For someone with known diabetes, a value <7% indicates  that their diabetes is well controlled and a value  greater than or equal to 7% indicates suboptimal  control. A1c targets should be individualized based on  duration of diabetes, age, comorbid conditions, and  other considerations. . Currently, no consensus exists regarding use of hemoglobin A1c for diagnosis of diabetes for children. .   09/21/2020 02:57 AM 7.9 (H) 4.8 - 5.6 % Final    Comment:    (NOTE) Pre diabetes:          5.7%-6.4%  Diabetes:              >6.4%  Glycemic control for   <7.0% adults with diabetes     CBG: Recent Labs  Lab 01/13/21 1135 01/13/21 1623 01/13/21 2108 01/13/21 2301 01/14/21 0753  GLUCAP 204* 227* 200* 254* 88   Magdalen Spatz, MSN, AGACNP-BC [place] for personal pager PCCM on call pager [phone] 01/14/2021 11:19 AM

## 2021-01-14 NOTE — TOC Initial Note (Addendum)
Transition of Care [facility]) - Initial/Assessment Note    Patient Details  Name: Todd [name] MRN: [id number] Date of Birth: 10-30-1961  Transition of Care [facility]) CM/SW Contact:    Joanne Chars, LCSW Phone Number: 01/14/2021, 10:05 AM  Clinical Narrative:  CSW met with pt regarding discharge plan.  Pt agreeable to [facility] referral.  Discussed payor may limit choice, choice document given.  Permission given to speak with wife [name].  Pt currently has [employer] for home O2, does not have a bipap.  PCP in place.  Pt is vaccinated for covid but not boosted.  Current equipment in home: walker, shower chair.    1345: Plan to request [facility] authorization for trilogy.  Order form filled out and signed.  [employer] will initiate insurance auth.  If denied, will need bipap in stead.                   Expected Discharge Plan: [place] Barriers to Discharge: Continued Medical Work up,Other (comment) (needing HH with [facility] Medicare)   Patient Goals and CMS Choice Patient states their goals for this hospitalization and ongoing recovery are:: "pain free life" CMS Medicare.gov Compare Post Acute Care list provided to:: Patient Choice offered to / list presented to : Patient  Expected Discharge Plan and Services Expected Discharge Plan: [place] In-house Referral: Clinical Social Work   Post Acute Care Choice: [place] arrangements for the past 2 months: [place]                                      Prior Living Arrangements/Services Living arrangements for the past 2 months: Single Family Home Lives with:: [place] (wife, 2 sons ages 41 and 6) Patient language and need for interpreter reviewed:: Yes Do you feel safe going back to the place where you live?: Yes      Need for Family Participation in Patient Care: No (Comment) Care giver support system in place?: Yes (comment) Current home services: Other  (comment) (none) Criminal Activity/Legal Involvement Pertinent to Current Situation/Hospitalization: No - Comment as needed  Activities of Daily Living Home Assistive Devices/Equipment: Cane (specify quad or straight) (straight) ADL Screening (condition at time of admission) Patient's cognitive ability adequate to safely complete daily activities?: Yes Is the patient deaf or have difficulty hearing?: No Does the patient have difficulty seeing, even when wearing glasses/contacts?: No Does the patient have difficulty concentrating, remembering, or making decisions?: No Patient able to express need for assistance with ADLs?: Yes Does the patient have difficulty dressing or bathing?: No Independently performs ADLs?: Yes (appropriate for developmental age) Does the patient have difficulty walking or climbing stairs?: Yes Weakness of Legs: Both Weakness of Arms/Hands: None  Permission Sought/Granted Permission sought to share information with : Family Supports Permission granted to share information with : Yes, Verbal Permission Granted  Share Information with NAME: wife [name]  Permission granted to share info w AGENCY: HH        Emotional Assessment Appearance:: Appears stated age Attitude/Demeanor/Rapport: Engaged Affect (typically observed): Appropriate,Pleasant Orientation: : Oriented to Self,Oriented to Place,Oriented to  Time,Oriented to Situation Alcohol / Substance Use: Not Applicable Psych Involvement: No (comment)  Admission diagnosis:  Acute and chronic respiratory failure with hypercapnia (HCC) [J96.22] Normochromic normocytic anemia [D64.9] Acute on chronic respiratory failure with hypercapnia (HCC) [J96.22] Acute on chronic heart failure,  unspecified heart failure type [facility]) [I50.9] Patient Active Problem List   Diagnosis Date Noted  . Acute on chronic heart failure ([place])   . Acute and chronic respiratory failure with hypercapnia ([place]) 01/12/2021  . Anxiety 01/12/2021   . Chronic diastolic CHF (congestive heart failure) ([place]) 01/12/2021  . Gastroesophageal reflux disease   . Sinus arrest 09/22/2020  . Syncope 09/20/2020  . Hyperlipidemia associated with type 2 diabetes mellitus ([place]) 09/20/2020  . COPD (chronic obstructive pulmonary disease) ([place]) 09/06/2020  . Collapse of right lung 08/06/2020  . Benign essential HTN 05/03/2020  . Gastroesophageal reflux disease with esophagitis   . Hematemesis with nausea 05/06/2018  . H/O total knee replacement, right 05/01/2018  . Insulin-requiring or dependent type II diabetes mellitus ([place])   . Acute tear medial meniscus 06/09/2014  . CAD (coronary artery disease)   . NSTEMI (non-ST elevated myocardial infarction) ([place]) 05/30/2013  . Diabetes mellitus ([place]) 05/30/2013  . Hyperlipidemia   . Tobacco abuse   . Hypertension associated with diabetes ([place]) 01/11/2012  . Moonshine poisoning ([place]) 01/11/2012  . Nausea and vomiting 01/11/2012  . Hypokalemia 01/11/2012   PCP:  [name], MD Pharmacy:   [facility] [place], [place] [address] [place] [address] Phone: [phone] Fax: [phone]     Social Determinants of Health (SDOH) Interventions    Readmission Risk Interventions No flowsheet data found.

## 2021-01-15 LAB — CBC WITH DIFFERENTIAL/PLATELET
Abs Immature Granulocytes: 0.06 10*3/uL (ref 0.00–0.07)
Basophils Absolute: 0 10*3/uL (ref 0.0–0.1)
Basophils Relative: 0 %
Eosinophils Absolute: 0 10*3/uL (ref 0.0–0.5)
Eosinophils Relative: 0 %
HCT: 32.4 % — ABNORMAL LOW (ref 39.0–52.0)
Hemoglobin: 10.4 g/dL — ABNORMAL LOW (ref 13.0–17.0)
Immature Granulocytes: 1 %
Lymphocytes Relative: 5 %
Lymphs Abs: 0.7 10*3/uL (ref 0.7–4.0)
MCH: 28.7 pg (ref 26.0–34.0)
MCHC: 32.1 g/dL (ref 30.0–36.0)
MCV: 89.5 fL (ref 80.0–100.0)
Monocytes Absolute: 1.1 10*3/uL — ABNORMAL HIGH (ref 0.1–1.0)
Monocytes Relative: 9 %
Neutro Abs: 10.6 10*3/uL — ABNORMAL HIGH (ref 1.7–7.7)
Neutrophils Relative %: 85 %
Platelets: 221 10*3/uL (ref 150–400)
RBC: 3.62 MIL/uL — ABNORMAL LOW (ref 4.22–5.81)
RDW: 13.2 % (ref 11.5–15.5)
WBC: 12.5 10*3/uL — ABNORMAL HIGH (ref 4.0–10.5)
nRBC: 0 % (ref 0.0–0.2)

## 2021-01-15 LAB — BASIC METABOLIC PANEL
Anion gap: 5 (ref 5–15)
BUN: 19 mg/dL (ref 8–23)
CO2: 42 mmol/L — ABNORMAL HIGH (ref 22–32)
Calcium: 9 mg/dL (ref 8.9–10.3)
Chloride: 86 mmol/L — ABNORMAL LOW (ref 98–111)
Creatinine, Ser: 0.65 mg/dL (ref 0.61–1.24)
GFR, Estimated: >60 mL/min (ref >60)
Glucose, Bld: 126 mg/dL — ABNORMAL HIGH (ref 70–99)
Potassium: 4.8 mmol/L (ref 3.5–5.1)
Sodium: 133 mmol/L — ABNORMAL LOW (ref 135–145)

## 2021-01-15 LAB — MAGNESIUM: Magnesium: 2.6 mg/dL — ABNORMAL HIGH (ref 1.7–2.4)

## 2021-01-15 LAB — GLUCOSE, CAPILLARY
Glucose-Capillary: 113 mg/dL — ABNORMAL HIGH (ref 70–99)
Glucose-Capillary: 170 mg/dL — ABNORMAL HIGH (ref 70–99)
Glucose-Capillary: 173 mg/dL — ABNORMAL HIGH (ref 70–99)
Glucose-Capillary: 180 mg/dL — ABNORMAL HIGH (ref 70–99)
Glucose-Capillary: 202 mg/dL — ABNORMAL HIGH (ref 70–99)
Glucose-Capillary: 254 mg/dL — ABNORMAL HIGH (ref 70–99)
Glucose-Capillary: 271 mg/dL — ABNORMAL HIGH (ref 70–99)

## 2021-01-15 MED ORDER — FUROSEMIDE 40 MG PO TABS
40.0000 mg | ORAL_TABLET | Freq: Every day | ORAL | Status: DC
  Administered 2021-01-15 – 2021-01-17 (×3): 40 mg via ORAL
  Filled 2021-01-15 (×3): qty 1

## 2021-01-15 MED ORDER — METHYLPREDNISOLONE SODIUM SUCC 40 MG IJ SOLR
40.0000 mg | Freq: Two times a day (BID) | INTRAMUSCULAR | Status: DC
  Administered 2021-01-15 – 2021-01-17 (×4): 40 mg via INTRAVENOUS
  Filled 2021-01-15 (×4): qty 1

## 2021-01-15 NOTE — Progress Notes (Signed)
Patient ID: Todd [name], male   DOB: 01-31-1961, 60 y.o.   MRN: [id number]  PROGRESS NOTE    EARNEST MCGILLIS  NIO:[id number] DOB: October 01, 1961 DOA: 01/11/2021 PCP: [name], MD   Brief Narrative:  60 y.o. male with medical history significant for hypertension, diabetes mellitus, anxiety, coronary artery disease, chronic diastolic CHF, COPD, and chronic hypoxic and hypercarbic respiratory failure and recent hospitalization at [facility] for worsening shortness of breath which was treated with diuretics, [name]  Was recommended that he use BiPAP while sleeping after discharge but he was not able to obtain BiPAP yet.  On presentation to the ED, patient was afebrile and saturating in the mid upper 90s on 3 L/min supplemental oxygen, mildly tachypneic, mildly tachycardic.  Chest x-ray showed chronic bibasilar scarring but no acute finding.  He was found to have mild leukocytosis and he was hypercapnic on ABG with PCO2 of 90 and pH of 7.32.  He was given intravenous Lasix, Solu-Medrol and started on BiPAP.  Pulmonary and cardiology were consulted.  Assessment & Plan:   Acute on chronic hypercapnic/hypoxic respiratory failure COPD exacerbation -Patient was recently treated at [facility] with steroids and diuretics and recommended BiPAP while sleeping on discharge but he has not been able to obtain BiPAP yet -He presented with worsening shortness of breath and was found to have hypercapnia with PCO2 of 90.  Chest x-ray showed chronic bibasilar scarring but no acute finding -COVID-19 test was negative on presentation -Initially required BiPAP on presentation.  Respiratory status is improving.  Currently on 1-2 L oxygen via nasal cannula during daytime. Care management team following regarding obtaining trilogy vent  versus BiPAP at home on discharge -Pulmonary following. -Decrease Solu-Medrol to 40 mg IV every 12 hours.  Continue nebs.  Continue Zithromax  Acute on chronic diastolic heart failure Mildly elevated troponin: Most likely from demand ischemia History of CAD -Fluid restriction.  Strict input and output with daily weights.  Negative balance of 3795 cc since admission.  Switch IV Lasix to oral.  Cardiology signed off on 01/14/2021.  Outpatient follow-up with cardiology.  Continue losartan and beta-blocker. -Had a recent echo at [place] which had shown normal EF and mild diastolic dysfunction  Leukocytosis -Monitor  Mild hyponatremia -Monitor  Hypokalemia -Improved  Anemia of chronic disease -Possibly from heart failure and COPD.  Hemoglobin stable.  No signs of bleeding  Diabetes mellitus type 2 uncontrolled with hyperglycemia - Continue Lantus.  Continue CBGs with SSI  Anxiety -Continue Lexapro.  Valium on hold for now.  Hypertension -Continue beta-blocker, Lasix and losartan  Generalized deconditioning -PT recommends home health PT.   DVT prophylaxis: Lovenox Code Status: Full Family Communication: None at bedside Disposition Plan: Status is: Inpatient  Remains inpatient appropriate because:Inpatient level of care appropriate due to severity of illness   Dispo: The patient is from: Home              Anticipated d/c is to: Home in 1 to 2 days if clinically improves and once  BiPAP/trilogy vent has been arranged for home home              Patient currently is not medically stable to d/c.   Difficult to place patient No   Consultants: Cardiology/pulmonary  Procedures: None  Antimicrobials: Zithromax   Subjective: Patient seen and  examined at bedside.  No chest pain, fever, vomiting reported.  Still feels short of breath with exertion but feeling a little better. Objective: Vitals:   01/14/21 2144 01/14/21 2300 01/15/21 0041 01/15/21 0345  BP:   124/74  100/63  Pulse: 81 63 66 64  Resp: 19 20  18   Temp:  97.7 F (36.5 C)  98.3 F (36.8 C)  TempSrc:  Axillary  Oral  SpO2: 100% 100% 100% 99%  Weight:      Height:        Intake/Output Summary (Last 24 hours) at 01/15/2021 0733 Last data filed at 01/15/2021 0726 Gross per 24 hour  Intake 2160 ml  Output 1600 ml  Net 560 ml   Filed Weights   01/12/21 1449 01/13/21 0542 01/14/21 0506  Weight: 94.6 kg 92.3 kg 92.3 kg    Examination:  General exam: No distress.  Looks chronically ill.  On home 2 L oxygen via nasal cannula currently.   Respiratory system: Bilateral decreased breath sounds at bases with some scattered crackles, no wheezing  cardiovascular system: S1-S2 heard, rate controlled Gastrointestinal system: Abdomen is slightly distended, soft and nontender.  Normal bowel sounds heard  extremities: No cyanosis.  Mild lower extremity edema present Central nervous system: Alert and awake; still slow to respond to questions; poor historian.  No focal neurological deficits.  Moving extremities.   Skin: No obvious petechiae/lesions Psychiatry: Flat affect    Data Reviewed: I have personally reviewed following labs and imaging studies  CBC: Recent Labs  Lab 01/11/21 2237 01/12/21 0210 01/12/21 0336 01/12/21 0645 01/13/21 0031 01/14/21 0213 01/15/21 0104  WBC 12.5*  --  10.1  --  7.1 9.8 12.5*  NEUTROABS  --   --   --   --   --   --  10.6*  HGB 10.6*   < > 10.3* 11.2* 10.9* 10.3* 10.4*  HCT 34.2*   < > 33.5* 33.0* 33.2* 33.2* 32.4*  MCV 92.2  --  92.5  --  88.1 90.7 89.5  PLT 252  --  198  --  220 219 221   < > = values in this interval not displayed.   Basic Metabolic Panel: Recent Labs  Lab 01/12/21 0336 01/12/21 0645 01/12/21 1728 01/13/21 0031 01/14/21 0213 01/15/21 0104  NA 133* 132* 134* 131* 132* 133*  K 3.8 3.7 3.4* 3.4* 4.2 4.8  CL 83*  --  85* 87* 89* 86*  CO2 40*  --  39* 35* 37* 42*  GLUCOSE 75  --  191* 278* 232* 126*  BUN 10  --  16  18 18 19   CREATININE 0.52*  --  0.85 0.78 0.65 0.65  CALCIUM 8.9  --  9.5 8.9 8.7* 9.0  MG  --   --  2.0 2.1 2.4 2.6*   GFR: Estimated Creatinine Clearance: 103.7 mL/min (by C-G formula based on SCr of 0.65 mg/dL). Liver Function Tests: Recent Labs  Lab 01/12/21 0231  AST 21  ALT 30  ALKPHOS 63  BILITOT 0.4  PROT 6.2*  ALBUMIN 3.3*   No results for input(s): LIPASE, AMYLASE in the last 168 hours. No results for input(s): AMMONIA in the last 168 hours. Coagulation Profile: No results for input(s): INR, PROTIME in the last 168 hours. Cardiac Enzymes: No results for input(s): CKTOTAL, CKMB, CKMBINDEX, TROPONINI in the last 168 hours. BNP (last 3 results) No results for input(s): PROBNP in the last 8760 hours. HbA1C: No results for input(s): HGBA1C in the  last 72 hours. CBG: Recent Labs  Lab 01/14/21 1135 01/14/21 1610 01/14/21 2010 01/14/21 2358 01/15/21 0452  GLUCAP 198* 178* 389* 170* 113*   Lipid Profile: Recent Labs    01/14/21 0213  CHOL 139  HDL 60  LDLCALC 63  TRIG 78  CHOLHDL 2.3   Thyroid Function Tests: No results for input(s): TSH, T4TOTAL, FREET4, T3FREE, THYROIDAB in the last 72 hours. Anemia Panel: No results for input(s): VITAMINB12, FOLATE, FERRITIN, TIBC, IRON, RETICCTPCT in the last 72 hours. Sepsis Labs: Recent Labs  Lab 01/12/21 0336  PROCALCITON <0.10    Recent Results (from the past 240 hour(s))  SARS CORONAVIRUS 2 (TAT 6-24 HRS) Nasopharyngeal Nasopharyngeal Swab     Status: None   Collection Time: 01/12/21  3:01 AM   Specimen: Nasopharyngeal Swab  Result Value Ref Range Status   SARS Coronavirus 2 NEGATIVE NEGATIVE Final    Comment: (NOTE) SARS-CoV-2 target nucleic acids are NOT DETECTED.  The SARS-CoV-2 RNA is generally detectable in upper and lower respiratory specimens during the acute phase of infection. Negative results do not preclude SARS-CoV-2 infection, do not rule out co-infections with other pathogens, and should  not be used as the sole basis for treatment or other patient management decisions. Negative results must be combined with clinical observations, patient history, and epidemiological information. The expected result is Negative.  Fact Sheet for Patients: SugarRoll.be  Fact Sheet for Healthcare Providers: https://www.woods-mathews.com/  This test is not yet approved or cleared by the [place] FDA and  has been authorized for detection and/or diagnosis of SARS-CoV-2 by FDA under an Emergency Use Authorization (EUA). This EUA will remain  in effect (meaning this test can be used) for the duration of the COVID-19 declaration under Se ction 564(b)(1) of the Act, 21 U.S.C. section 360bbb-3(b)(1), unless the authorization is terminated or revoked sooner.  Performed at [facility], [place] [address], [place], [place] [address]          Radiology Studies: No results found.      Scheduled Meds: . arformoterol  15 mcg Nebulization BID  . aspirin EC  81 mg Oral Daily  . atorvastatin  80 mg Oral QPM  . azithromycin  500 mg Oral Daily  . escitalopram  10 mg Oral Daily  . furosemide  40 mg Intravenous Daily  . insulin aspart  0-15 Units Subcutaneous Q4H  . insulin glargine  8 Units Subcutaneous Daily  . losartan  50 mg Oral Daily  . methylPREDNISolone (SOLU-MEDROL) injection  40 mg Intravenous Q8H  . nebivolol  5 mg Oral Daily  . pantoprazole  40 mg Oral QHS  . revefenacin  175 mcg Nebulization Daily   Continuous Infusions: . sodium chloride            Aline August, MD [facility] 01/15/2021, 7:33 AM

## 2021-01-16 LAB — BASIC METABOLIC PANEL
Anion gap: 4 — ABNORMAL LOW (ref 5–15)
BUN: 16 mg/dL (ref 8–23)
CO2: 41 mmol/L — ABNORMAL HIGH (ref 22–32)
Calcium: 8.5 mg/dL — ABNORMAL LOW (ref 8.9–10.3)
Chloride: 86 mmol/L — ABNORMAL LOW (ref 98–111)
Creatinine, Ser: 0.56 mg/dL — ABNORMAL LOW (ref 0.61–1.24)
GFR, Estimated: >60 mL/min (ref >60)
Glucose, Bld: 197 mg/dL — ABNORMAL HIGH (ref 70–99)
Potassium: 4.4 mmol/L (ref 3.5–5.1)
Sodium: 131 mmol/L — ABNORMAL LOW (ref 135–145)

## 2021-01-16 LAB — GLUCOSE, CAPILLARY
Glucose-Capillary: 229 mg/dL — ABNORMAL HIGH (ref 70–99)
Glucose-Capillary: 127 mg/dL — ABNORMAL HIGH (ref 70–99)
Glucose-Capillary: 256 mg/dL — ABNORMAL HIGH (ref 70–99)
Glucose-Capillary: 197 mg/dL — ABNORMAL HIGH (ref 70–99)
Glucose-Capillary: 455 mg/dL — ABNORMAL HIGH (ref 70–99)

## 2021-01-16 LAB — MAGNESIUM: Magnesium: 2.3 mg/dL (ref 1.7–2.4)

## 2021-01-16 MED ORDER — INSULIN ASPART 100 UNIT/ML ~~LOC~~ SOLN
18.0000 [IU] | Freq: Once | SUBCUTANEOUS | Status: AC
  Administered 2021-01-16: 18 [IU] via SUBCUTANEOUS

## 2021-01-16 NOTE — Progress Notes (Signed)
Patient ID: Todd [name], male   DOB: 1960-12-10, 60 y.o.   MRN: [id number]  PROGRESS NOTE    AMMAR MOFFATT  [id number]:[id number] DOB: [birth year] DOA: 01/11/2021 PCP: [name], MD   Brief Narrative:  60 y.o. male with medical history significant for hypertension, diabetes mellitus, anxiety, coronary artery disease, chronic diastolic CHF, COPD, and chronic hypoxic and hypercarbic respiratory failure and recent hospitalization at [facility] for worsening shortness of breath which was treated with diuretics, [name]  Was recommended that he use BiPAP while sleeping after discharge but he was not able to obtain BiPAP yet.  On presentation to the ED, patient was afebrile and saturating in the mid upper 90s on 3 L/min supplemental oxygen, mildly tachypneic, mildly tachycardic.  Chest x-ray showed chronic bibasilar scarring but no acute finding.  He was found to have mild leukocytosis and he was hypercapnic on ABG with PCO2 of 90 and pH of 7.32.  He was given intravenous Lasix, Solu-Medrol and started on BiPAP.  Pulmonary and cardiology were consulted.  Assessment & Plan:   Acute on chronic hypercapnic/hypoxic respiratory failure COPD exacerbation -Patient was recently treated at [facility] with steroids and diuretics and recommended BiPAP while sleeping on discharge but he has not been able to obtain BiPAP yet -He presented with worsening shortness of breath and was found to have hypercapnia with PCO2 of 90.  Chest x-ray showed chronic bibasilar scarring but no acute finding -COVID-19 test was negative on presentation -Initially required BiPAP on presentation.  Respiratory status is improving.  Currently on 1-2 L oxygen via nasal cannula during daytime. Care management team following regarding obtaining trilogy vent  versus BiPAP at home on discharge -Pulmonary has signed off. -Decrease Solu-Medrol to 40 mg IV every 24 hours and probably switch to oral prednisone tomorrow.  Continue nebs.  Continue Zithromax  Acute on chronic diastolic heart failure Mildly elevated troponin: Most likely from demand ischemia History of CAD -Fluid restriction.  Strict input and output with daily weights.  Negative balance of 3490 cc since admission.  Continue oral Lasix.  Cardiology signed off on 01/14/2021.  Outpatient follow-up with cardiology.  Continue losartan and beta-blocker. -Had a recent echo at [place] which had shown normal EF and mild diastolic dysfunction  Leukocytosis -Monitor intermittently  Hyponatremia -Monitor  Hypokalemia -Resolved  Anemia of chronic disease -Possibly from heart failure and COPD.  Hemoglobin stable.  No signs of bleeding  Diabetes mellitus type 2 uncontrolled with hyperglycemia - Continue Lantus.  Continue CBGs with SSI  Anxiety -Continue Lexapro.  Valium on hold for now.  Hypertension -Continue beta-blocker, Lasix and losartan  Generalized deconditioning -PT recommends home health PT.   DVT prophylaxis: Lovenox Code Status: Full Family Communication: None at bedside Disposition Plan: Status is: Inpatient  Remains inpatient appropriate because:Inpatient level of care appropriate due to severity of illness   Dispo: The patient is from: Home              Anticipated d/c is to: Home in 1 to 2 days if clinically improves and once  BiPAP/trilogy vent has been arranged for home home              Patient currently is medically stable to d/c.   Difficult to place patient No   Consultants: Cardiology/pulmonary  Procedures: None  Antimicrobials: Zithromax  Subjective: Patient seen and examined at bedside.  Denies worsening shortness breath, fever, vomiting or chest pain.   Objective: Vitals:   01/15/21 2300 01/16/21 0312 01/16/21 0400 01/16/21  0800  BP: 104/65 111/75  (!) 142/102  Pulse:   65 69  Resp:   (!) 23 19  Temp: (!) 97.1 F (36.2 C) 98.3 F (36.8 C)  98 F (36.7 C)  TempSrc: Axillary Oral  Oral  SpO2:   97% 94%  Weight:      Height:        Intake/Output Summary (Last 24 hours) at 01/16/2021 0905 Last data filed at 01/16/2021 0137 Gross per 24 hour  Intake 1440 ml  Output 1100 ml  Net 340 ml   Filed Weights   01/12/21 1449 01/13/21 0542 01/14/21 0506  Weight: 94.6 kg 92.3 kg 92.3 kg    Examination:  General exam:  Looks chronically ill.  On 2 L oxygen via nasal cannula currently.  No acute distress. Respiratory system: Decreased breath sounds at bases bilaterally with some crackles, no wheezing  cardiovascular system: Rate controlled, sinus Gastrointestinal system: Abdomen is mildly distended, soft and nontender.  Bowel sounds are hard  extremities: No cyanosis.  Mild lower extremity edema present Central nervous system: Awake and oriented; slow to respond to questions.  Poor historian.  No focal neurological deficits.  Moves extremities Skin: No obvious ecchymosis/rashes Psychiatry: Affect is flat   Data Reviewed: I have personally reviewed following labs and imaging studies  CBC: Recent Labs  Lab 01/11/21 2237 01/12/21 0210 01/12/21 0336 01/12/21 0645 01/13/21 0031 01/14/21 0213 01/15/21 0104  WBC 12.5*  --  10.1  --  7.1 9.8 12.5*  NEUTROABS  --   --   --   --   --   --  10.6*  HGB 10.6*   < > 10.3* 11.2* 10.9* 10.3* 10.4*  HCT 34.2*   < > 33.5* 33.0* 33.2* 33.2* 32.4*  MCV 92.2  --  92.5  --  88.1 90.7 89.5  PLT 252  --  198  --  220 219 221   < > = values in this interval not displayed.   Basic Metabolic Panel: Recent Labs  Lab 01/12/21 1728 01/13/21 0031 01/14/21 0213 01/15/21 0104 01/16/21 0430  NA 134* 131* 132* 133* 131*  K 3.4* 3.4* 4.2 4.8 4.4  CL 85* 87* 89* 86* 86*  CO2 39* 35* 37* 42* 41*  GLUCOSE 191* 278* 232* 126* 197*  BUN 16 18 18 19 16   CREATININE 0.85 0.78  0.65 0.65 0.56*  CALCIUM 9.5 8.9 8.7* 9.0 8.5*  MG 2.0 2.1 2.4 2.6* 2.3   GFR: Estimated Creatinine Clearance: 103.7 mL/min (A) (by C-G formula based on SCr of 0.56 mg/dL (L)). Liver Function Tests: Recent Labs  Lab 01/12/21 0231  AST 21  ALT 30  ALKPHOS 63  BILITOT 0.4  PROT 6.2*  ALBUMIN 3.3*   No results for input(s): LIPASE, AMYLASE in the last 168 hours. No results for input(s): AMMONIA in the last 168 hours. Coagulation Profile: No results for input(s): INR, PROTIME in the last 168 hours. Cardiac Enzymes: No results for input(s): CKTOTAL, CKMB, CKMBINDEX, TROPONINI in the last 168 hours. BNP (last 3 results) No results for input(s): PROBNP in the last 8760 hours. HbA1C: No results for input(s): HGBA1C in the last 72 hours. CBG: Recent Labs  Lab 01/15/21 1643 01/15/21 1934 01/15/21 2326 01/16/21 0321 01/16/21 0801  GLUCAP 202* 173* 271* 229* 127*   Lipid  Profile: Recent Labs    01/14/21 0213  CHOL 139  HDL 60  LDLCALC 63  TRIG 78  CHOLHDL 2.3   Thyroid Function Tests: No results for input(s): TSH, T4TOTAL, FREET4, T3FREE, THYROIDAB in the last 72 hours. Anemia Panel: No results for input(s): VITAMINB12, FOLATE, FERRITIN, TIBC, IRON, RETICCTPCT in the last 72 hours. Sepsis Labs: Recent Labs  Lab 01/12/21 0336  PROCALCITON <0.10    Recent Results (from the past 240 hour(s))  SARS CORONAVIRUS 2 (TAT 6-24 HRS) Nasopharyngeal Nasopharyngeal Swab     Status: None   Collection Time: 01/12/21  3:01 AM   Specimen: Nasopharyngeal Swab  Result Value Ref Range Status   SARS Coronavirus 2 NEGATIVE NEGATIVE Final    Comment: (NOTE) SARS-CoV-2 target nucleic acids are NOT DETECTED.  The SARS-CoV-2 RNA is generally detectable in upper and lower respiratory specimens during the acute phase of infection. Negative results do not preclude SARS-CoV-2 infection, do not rule out co-infections with other pathogens, and should not be used as the sole basis for  treatment or other patient management decisions. Negative results must be combined with clinical observations, patient history, and epidemiological information. The expected result is Negative.  Fact Sheet for Patients: SugarRoll.be  Fact Sheet for Healthcare Providers: https://www.woods-mathews.com/  This test is not yet approved or cleared by the [place] FDA and  has been authorized for detection and/or diagnosis of SARS-CoV-2 by FDA under an Emergency Use Authorization (EUA). This EUA will remain  in effect (meaning this test can be used) for the duration of the COVID-19 declaration under Se ction 564(b)(1) of the Act, 21 U.S.C. section 360bbb-3(b)(1), unless the authorization is terminated or revoked sooner.  Performed at [facility], [place] [address], [place], [place] [address]          Radiology Studies: No results found.      Scheduled Meds: . arformoterol  15 mcg Nebulization BID  . aspirin EC  81 mg Oral Daily  . atorvastatin  80 mg Oral QPM  . azithromycin  500 mg Oral Daily  . escitalopram  10 mg Oral Daily  . furosemide  40 mg Oral Daily  . insulin aspart  0-15 Units Subcutaneous Q4H  . insulin glargine  8 Units Subcutaneous Daily  . losartan  50 mg Oral Daily  . methylPREDNISolone (SOLU-MEDROL) injection  40 mg Intravenous BID  . nebivolol  5 mg Oral Daily  . pantoprazole  40 mg Oral QHS  . revefenacin  175 mcg Nebulization Daily   Continuous Infusions: . sodium chloride            Aline August, MD [facility] 01/16/2021, 9:05 AM

## 2021-01-16 NOTE — Plan of Care (Signed)
  Problem: Education: Goal: Knowledge of General Education information will improve Description Including pain rating scale, medication(s)/side effects and non-pharmacologic comfort measures Outcome: Progressing   

## 2021-01-17 LAB — BASIC METABOLIC PANEL
Anion gap: 4 — ABNORMAL LOW (ref 5–15)
BUN: 13 mg/dL (ref 8–23)
CO2: 44 mmol/L — ABNORMAL HIGH (ref 22–32)
Calcium: 8.6 mg/dL — ABNORMAL LOW (ref 8.9–10.3)
Chloride: 88 mmol/L — ABNORMAL LOW (ref 98–111)
Creatinine, Ser: 0.59 mg/dL — ABNORMAL LOW (ref 0.61–1.24)
GFR, Estimated: >60 mL/min (ref >60)
Glucose, Bld: 122 mg/dL — ABNORMAL HIGH (ref 70–99)
Potassium: 4.5 mmol/L (ref 3.5–5.1)
Sodium: 136 mmol/L (ref 135–145)

## 2021-01-17 LAB — CBC WITH DIFFERENTIAL/PLATELET
Abs Immature Granulocytes: 0.03 10*3/uL (ref 0.00–0.07)
Basophils Absolute: 0 10*3/uL (ref 0.0–0.1)
Basophils Relative: 0 %
Eosinophils Absolute: 0 10*3/uL (ref 0.0–0.5)
Eosinophils Relative: 0 %
HCT: 30.9 % — ABNORMAL LOW (ref 39.0–52.0)
Hemoglobin: 10 g/dL — ABNORMAL LOW (ref 13.0–17.0)
Immature Granulocytes: 0 %
Lymphocytes Relative: 8 %
Lymphs Abs: 0.7 10*3/uL (ref 0.7–4.0)
MCH: 29.2 pg (ref 26.0–34.0)
MCHC: 32.4 g/dL (ref 30.0–36.0)
MCV: 90.1 fL (ref 80.0–100.0)
Monocytes Absolute: 0.6 10*3/uL (ref 0.1–1.0)
Monocytes Relative: 7 %
Neutro Abs: 7.3 10*3/uL (ref 1.7–7.7)
Neutrophils Relative %: 85 %
Platelets: 173 10*3/uL (ref 150–400)
RBC: 3.43 MIL/uL — ABNORMAL LOW (ref 4.22–5.81)
RDW: 13.2 % (ref 11.5–15.5)
WBC: 8.6 10*3/uL (ref 4.0–10.5)
nRBC: 0 % (ref 0.0–0.2)

## 2021-01-17 LAB — GLUCOSE, CAPILLARY
Glucose-Capillary: 137 mg/dL — ABNORMAL HIGH (ref 70–99)
Glucose-Capillary: 180 mg/dL — ABNORMAL HIGH (ref 70–99)
Glucose-Capillary: 207 mg/dL — ABNORMAL HIGH (ref 70–99)
Glucose-Capillary: 213 mg/dL — ABNORMAL HIGH (ref 70–99)

## 2021-01-17 LAB — MAGNESIUM: Magnesium: 2.4 mg/dL (ref 1.7–2.4)

## 2021-01-17 MED ORDER — PREDNISONE 20 MG PO TABS: 40.0000 mg | ORAL_TABLET | Freq: Every day | ORAL | 0 refills | Status: DC

## 2021-01-17 MED ORDER — NEBIVOLOL HCL 5 MG PO TABS: 5.0000 mg | ORAL_TABLET | Freq: Every day | ORAL | 0 refills | Status: DC

## 2021-01-17 MED ORDER — METHYLPREDNISOLONE SODIUM SUCC 40 MG IJ SOLR: 40.0000 mg | INTRAMUSCULAR | Status: DC

## 2021-01-17 NOTE — Progress Notes (Signed)
SATURATION QUALIFICATIONS: (This note is used to comply with regulatory documentation for home oxygen)  Patient Saturations on Room Air at Rest = 98%  Patient Saturations on Room Air while Ambulating = 95%    Patient did not require oxygen while walking.   Arby Barrette, PT Pager [phone]

## 2021-01-17 NOTE — Progress Notes (Signed)
Patient ID: Todd [name], male   DOB: 1961/02/20, 60 y.o.   MRN: [id number]  PROGRESS NOTE    Todd [name]  TSV:[id number] DOB: 1961/05/27 DOA: 01/11/2021 PCP: [name], MD   Brief Narrative:  60 y.o. male with medical history significant for hypertension, diabetes mellitus, anxiety, coronary artery disease, chronic diastolic CHF, COPD, and chronic hypoxic and hypercarbic respiratory failure and recent hospitalization at [facility] for worsening shortness of breath which was treated with diuretics, [name]  Was recommended that he use BiPAP while sleeping after discharge but he was not able to obtain BiPAP yet.  On presentation to the ED, patient was afebrile and saturating in the mid upper 90s on 3 L/min supplemental oxygen, mildly tachypneic, mildly tachycardic.  Chest x-ray showed chronic bibasilar scarring but no acute finding.  He was found to have mild leukocytosis and he was hypercapnic on ABG with PCO2 of 90 and pH of 7.32.  He was given intravenous Lasix, Solu-Medrol and started on BiPAP.  Pulmonary and cardiology were consulted.  Assessment & Plan:   Acute on chronic hypercapnic/hypoxic respiratory failure COPD exacerbation -Patient was recently treated at [facility] with steroids and diuretics and recommended BiPAP while sleeping on discharge but he has not been able to obtain BiPAP yet -He presented with worsening shortness of breath and was found to have hypercapnia with PCO2 of 90.  Chest x-ray showed chronic bibasilar scarring but no acute finding -COVID-19 test was negative on presentation -Initially required BiPAP on presentation.  Respiratory status is improving.  Currently on 2 L oxygen via nasal cannula during daytime. Care management team following regarding obtaining trilogy vent  versus BiPAP at home on discharge -Pulmonary has signed off. -Decrease Solu-Medrol to 40 mg IV every 24 hours and probably switch to oral prednisone on discharge.  Continue nebs.  DC Zithromax  Acute on chronic diastolic heart failure Mildly elevated troponin: Most likely from demand ischemia History of CAD -Fluid restriction.  Strict input and output with daily weights.  Negative balance of 3490 cc since admission.  Continue oral Lasix.  Cardiology signed off on 01/14/2021.  Outpatient follow-up with cardiology.  Continue losartan and beta-blocker. -Had a recent echo at [place] which had shown normal EF and mild diastolic dysfunction  Leukocytosis -Monitor intermittently  Hyponatremia -Improved  Hypokalemia -Resolved  Anemia of chronic disease -Possibly from heart failure and COPD.  Hemoglobin stable.  No signs of bleeding  Diabetes mellitus type 2 uncontrolled with hyperglycemia - Continue Lantus.  Continue CBGs with SSI  Anxiety -Continue Lexapro.  Valium on hold for now.  Hypertension -Continue beta-blocker, Lasix and losartan  Generalized deconditioning -PT recommends home health PT.   DVT prophylaxis: Lovenox Code Status: Full Family Communication: None at bedside Disposition Plan: Status is: Inpatient  Remains inpatient appropriate because:Inpatient level of care appropriate due to severity of illness   Dispo: The patient is from: Home              Anticipated d/c is to: Home  once BiPAP/trilogy vent has been arranged for home home              Patient currently is medically stable to d/c.   Difficult to place patient No   Consultants: Cardiology/pulmonary  Procedures: None  Antimicrobials: Zithromax   Subjective: Patient seen and examined at  bedside.  No overnight fever, vomiting, chest pain or worsening shortness of breath.   Objective: Vitals:   01/17/21 0354 01/17/21 0716 01/17/21 0806 01/17/21 0815  BP: (!) 131/56  (!) 153/129  118/73  Pulse: 63  79 70  Resp: 15 (!) 22 20 20   Temp: 98.5 F (36.9 C)     TempSrc: Oral     SpO2: 100%  100% 99%  Weight:      Height:        Intake/Output Summary (Last 24 hours) at 01/17/2021 0930 Last data filed at 01/17/2021 0013 Gross per 24 hour  Intake 480 ml  Output 400 ml  Net 80 ml   Filed Weights   01/12/21 1449 01/13/21 0542 01/14/21 0506  Weight: 94.6 kg 92.3 kg 92.3 kg    Examination:  General exam:  Looks chronically ill.  No distress.  Currently on 2 L oxygen via nasal cannula.   Respiratory system: Bilateral decreased breath sounds at bases with some crackles cardiovascular system: S1-S2 heard, rate controlled Gastrointestinal system: Abdomen is mildly distended, soft and nontender.  Normal bowel sounds heard  extremities: Trace lower extremity edema present; no clubbing    Data Reviewed: I have personally reviewed following labs and imaging studies  CBC: Recent Labs  Lab 01/12/21 0336 01/12/21 0645 01/13/21 0031 01/14/21 0213 01/15/21 0104 01/17/21 0248  WBC 10.1  --  7.1 9.8 12.5* 8.6  NEUTROABS  --   --   --   --  10.6* 7.3  HGB 10.3* 11.2* 10.9* 10.3* 10.4* 10.0*  HCT 33.5* 33.0* 33.2* 33.2* 32.4* 30.9*  MCV 92.5  --  88.1 90.7 89.5 90.1  PLT 198  --  220 219 221 128   Basic Metabolic Panel: Recent Labs  Lab 01/13/21 0031 01/14/21 0213 01/15/21 0104 01/16/21 0430 01/17/21 0248  NA 131* 132* 133* 131* 136  K 3.4* 4.2 4.8 4.4 4.5  CL 87* 89* 86* 86* 88*  CO2 35* 37* 42* 41* 44*  GLUCOSE 278* 232* 126* 197* 122*  BUN 18 18 19 16 13   CREATININE 0.78 0.65 0.65 0.56* 0.59*  CALCIUM 8.9 8.7* 9.0 8.5* 8.6*  MG 2.1 2.4 2.6* 2.3 2.4   GFR: Estimated Creatinine Clearance: 103.7 mL/min (A) (by C-G formula based on SCr of 0.59 mg/dL (L)). Liver Function Tests: Recent Labs  Lab 01/12/21 0231  AST 21  ALT 30  ALKPHOS 63  BILITOT 0.4  PROT 6.2*  ALBUMIN 3.3*   No results for input(s): LIPASE, AMYLASE in the last 168 hours. No  results for input(s): AMMONIA in the last 168 hours. Coagulation Profile: No results for input(s): INR, PROTIME in the last 168 hours. Cardiac Enzymes: No results for input(s): CKTOTAL, CKMB, CKMBINDEX, TROPONINI in the last 168 hours. BNP (last 3 results) No results for input(s): PROBNP in the last 8760 hours. HbA1C: No results for input(s): HGBA1C in the last 72 hours. CBG: Recent Labs  Lab 01/16/21 1545 01/16/21 1919 01/17/21 0015 01/17/21 0353 01/17/21 0804  GLUCAP 197* 455* 180* 137* 207*   Lipid Profile: No results for input(s): CHOL, HDL, LDLCALC, TRIG, CHOLHDL, LDLDIRECT in the last 72 hours. Thyroid Function Tests: No results for input(s): TSH, T4TOTAL, FREET4, T3FREE, THYROIDAB in the last 72 hours. Anemia Panel: No results for input(s): VITAMINB12, FOLATE, FERRITIN, TIBC, IRON, RETICCTPCT in the last 72 hours. Sepsis Labs: Recent Labs  Lab 01/12/21 0336  PROCALCITON <0.10    Recent Results (from the past 240 hour(s))  SARS CORONAVIRUS 2 (TAT  6-24 HRS) Nasopharyngeal Nasopharyngeal Swab     Status: None   Collection Time: 01/12/21  3:01 AM   Specimen: Nasopharyngeal Swab  Result Value Ref Range Status   SARS Coronavirus 2 NEGATIVE NEGATIVE Final    Comment: (NOTE) SARS-CoV-2 target nucleic acids are NOT DETECTED.  The SARS-CoV-2 RNA is generally detectable in upper and lower respiratory specimens during the acute phase of infection. Negative results do not preclude SARS-CoV-2 infection, do not rule out co-infections with other pathogens, and should not be used as the sole basis for treatment or other patient management decisions. Negative results must be combined with clinical observations, patient history, and epidemiological information. The expected result is Negative.  Fact Sheet for Patients: SugarRoll.be  Fact Sheet for Healthcare Providers: https://www.woods-mathews.com/  This test is not yet approved or  cleared by the [place] FDA and  has been authorized for detection and/or diagnosis of SARS-CoV-2 by FDA under an Emergency Use Authorization (EUA). This EUA will remain  in effect (meaning this test can be used) for the duration of the COVID-19 declaration under Se ction 564(b)(1) of the Act, 21 U.S.C. section 360bbb-3(b)(1), unless the authorization is terminated or revoked sooner.  Performed at [facility], [place] [address], [place], [place] [address]          Radiology Studies: No results found.      Scheduled Meds: . arformoterol  15 mcg Nebulization BID  . aspirin EC  81 mg Oral Daily  . atorvastatin  80 mg Oral QPM  . azithromycin  500 mg Oral Daily  . escitalopram  10 mg Oral Daily  . furosemide  40 mg Oral Daily  . insulin aspart  0-15 Units Subcutaneous Q4H  . insulin glargine  8 Units Subcutaneous Daily  . losartan  50 mg Oral Daily  . methylPREDNISolone (SOLU-MEDROL) injection  40 mg Intravenous BID  . nebivolol  5 mg Oral Daily  . pantoprazole  40 mg Oral QHS  . revefenacin  175 mcg Nebulization Daily   Continuous Infusions: . sodium chloride            Aline August, MD [facility] 01/17/2021, 9:30 AM

## 2021-01-17 NOTE — Progress Notes (Signed)
Physical Therapy Treatment Patient Details Name: Todd [name] MRN: [id number] DOB: 01-11-1961 Today's Date: 01/17/2021    History of Present Illness 60yo male admitted 01/11/21 with SOB, BLE edema, and confusion. Admitted with COPD exacerbation, acute on chronic hypercarbic respiratory failure. PMH CAD, NSTEMI, DM, HLD, HTN, panic attack, tobacco abuse, ACL repair, carpal tunnel release, PPM placement, R TKA    PT Comments    Patient mildly confused regarding therapist's need to take sat/heart monitor with [place] while walking. Discussed for several minutes and pt continued to insist we only needed the oxygen tank and it would tell me what I needed. Used cane today as pt did PTA, however pt slightly unsteady and ultimately pt wanted the cane in one hand and the oxygen carrier in the other hand. Recommend continued use of RW for safest ambulation. See separate walking oxygen saturation note. Tolerated on room air.     Follow Up Recommendations  Home health PT;Supervision - Intermittent     Equipment Recommendations  Rolling walker with 5" wheels    Recommendations for Other Services       Precautions / Restrictions Precautions Precautions: Fall;Other (comment) Precaution Comments: watch sats    Mobility  Bed Mobility               General bed mobility comments: OOB in recliner upon entry    Transfers Overall transfer level: Needs assistance Equipment used: Straight cane Transfers: Sit to/from Stand Sit to Stand: Min guard         General transfer comment: min guard for safety, Stood from chair x2  Ambulation/Gait Ambulation/Gait assistance: Herbalist (Feet): 200 Feet (seated rest; 80) Assistive device: Straight cane Gait Pattern/deviations: Step-through pattern;Trunk flexed;Drifts right/left Gait velocity: decreased   General Gait Details: Slow, mildly unsteady gait with pt asking to push the oxygen tank so he would have bil UE support; this caused  him to drift, but likely due to pushing O2 tank; 2 standing rest breaks due to leg pain/fatigue   Stairs             Wheelchair Mobility    Modified Rankin (Stroke Patients Only)       Balance Overall balance assessment: Needs assistance Sitting-balance support: Feet supported;No upper extremity supported Sitting balance-Leahy Scale: Good     Standing balance support: During functional activity Standing balance-Leahy Scale: Poor Standing balance comment: Requires UE support in standing, unsteady with turning                            Cognition Arousal/Alertness: Awake/alert Behavior During Therapy: WFL for tasks assessed/performed;Anxious Overall Cognitive Status: No family/caregiver present to determine baseline cognitive functioning                                 General Comments: pt perseverating on PT not needing to take monitor with [place] to measure his oxygen (arguing we just need to know he's on 2L and leave the monitor behind; continued to argue even with explanation and showing him his sat number)      Exercises      General Comments        Pertinent Vitals/Pain Pain Assessment: Faces Faces Pain Scale: Hurts little more Pain Location: left hip, bil knees Pain Descriptors / Indicators: Sore;Aching Pain Intervention(s): Limited activity within patient's tolerance;Monitored during session    Home Living  Prior Function            PT Goals (current goals can now be found in the care plan section) Acute Rehab PT Goals Patient Stated Goal: feel better Time For Goal Achievement: 01/27/21 Potential to Achieve Goals: Good Progress towards PT goals: Progressing toward goals    Frequency    Min 3X/week      PT Plan Current plan remains appropriate    Co-evaluation              AM-PAC PT "6 Clicks" Mobility   Outcome Measure  Help needed turning from your back to your side while in  a flat bed without using bedrails?: None Help needed moving from lying on your back to sitting on the side of a flat bed without using bedrails?: None Help needed moving to and from a bed to a chair (including a wheelchair)?: A Little Help needed standing up from a chair using your arms (e.g., wheelchair or bedside chair)?: A Little Help needed to walk in hospital room?: A Little Help needed climbing 3-5 steps with a railing? : A Little 6 Click Score: 20    End of Session Equipment Utilized During Treatment: Gait belt;Oxygen Activity Tolerance: Patient tolerated treatment well Patient left: in chair;with call bell/phone within reach Nurse Communication: Mobility status PT Visit Diagnosis: Muscle weakness (generalized) (M62.81);Unsteadiness on feet (R26.81)     Time: 0355-9741 PT Time Calculation (min) (ACUTE ONLY): 28 min  Charges:  $Gait Training: 23-37 mins                      Arby Barrette, PT Pager [phone]    Rexanne Mano 01/17/2021, 12:24 PM

## 2021-01-17 NOTE — Discharge Summary (Signed)
Physician Discharge Summary  Todd [name] BPZ:[id number] DOB: 1961/07/11 DOA: 01/11/2021  PCP: [name], MD  Admit date: 01/11/2021 Discharge date: 01/17/2021  Admitted From: Home Disposition: Home  Recommendations for Outpatient Follow-up:  1. Follow up with PCP in 1 week with repeat CBC/BMP 2. Outpatient follow-up with pulmonary and cardiology 3. Follow up in ED if symptoms worsen or new appear   Home Health: Home with PT Equipment/Devices: Trilogy vent  Discharge Condition: Guarded CODE STATUS: Full Diet recommendation: Heart healthy/fluid restriction of up to 1500 cc a day/carb modified  Brief/Interim Summary: 60 y.o.malewith medical history significant forhypertension, diabetes mellitus, anxiety, coronary artery disease, chronic diastolic CHF, COPD, and chronic hypoxic and hypercarbic respiratory failure and recent hospitalization at [facility] for worsening shortness of breath which was treated with diuretics, [name]  Was recommended that he use BiPAP while sleeping after discharge but he was not able to obtain BiPAP yet.  On presentation to the ED, patient was afebrile and saturating in the mid upper 90s on 3 L/min supplemental oxygen, mildly tachypneic, mildly tachycardic.  Chest x-ray showed chronic bibasilar scarring but no acute finding.  He was found to have mild leukocytosis and he was hypercapnic on ABG with PCO2 of 90 and pH of 7.32.  He was given intravenous Lasix, Solu-Medrol and started on BiPAP.  Pulmonary and cardiology were consulted.  During the hospitalization, patient's condition has gradually improved.  He has been transitioned to oral Lasix and Solu-Medrol is being tapered.  Pulmonary recommended home trilogy versus BiPAP arrangement.  He has been approved for home trilogy vent.  Pulmonary and  cardiology have signed off recommending outpatient follow-up.  He will be discharged home on oral prednisone today with outpatient follow-up with pulmonary and cardiology.  Discharge Diagnoses:   Acute on chronic hypercapnic/hypoxic respiratory failure COPD exacerbation -Patient was recently treated at [facility] with steroids and diuretics and recommended BiPAP while sleeping on discharge but he has not been able to obtain BiPAP yet -He presented with worsening shortness of breath and was found to have hypercapnia with PCO2 of 90.  Chest x-ray showed chronic bibasilar scarring but no acute finding -COVID-19 test was negative on presentation -Initially required BiPAP on presentation.  Respiratory status is improving. -Pulmonary has signed off and has recommended outpatient follow-up. -Currently on tapering doses of IV Solu-Medrol along with nebs.  Also treated with oral Zithromax -Home trilogy vent has been approved and settings is as per pulmonary recommendations.  Discharge patient home today on oral prednisone 40 mg daily for 7 days and to continue trilogy vent at night or while sleeping.  Patient ambulated today and did not require any supplemental oxygen.    Acute on chronic diastolic heart failure Mildly elevated troponin: Most likely from demand ischemia History of CAD -Fluid restriction.  Strict input and output with daily weights.  Negative balance of 4610cc since admission.  Continue oral Lasix.  Cardiology signed off on 01/14/2021.  Outpatient follow-up with cardiology.  Continue losartan and nebivolol as per cardiology. -Had a recent echo at [facility] which had shown normal EF and mild diastolic dysfunction  Leukocytosis -Resolved  Hyponatremia -Improved  Hypokalemia -Resolved  Anemia of chronic disease -Possibly from heart failure and COPD.  Hemoglobin stable.  No signs of bleeding  Diabetes mellitus type 2 uncontrolled with  hyperglycemia -  Continue Lantus.  Continue CBGs with SSI  Anxiety -Continue Lexapro.  Valium on hold for now.  Outpatient follow-up with PCP regarding Valium resumption  Hypertension -Continue beta-blocker, Lasix and losartan  Generalized deconditioning -Patient will need home health PT.  Discharge Instructions  Discharge Instructions    Ambulatory referral to Cardiology   Complete by: As directed    Hospital followup   Ambulatory referral to Pulmonology   Complete by: As directed    Hospital followup   Reason for referral: Asthma/COPD   Diet - low sodium heart healthy   Complete by: As directed    Diet Carb Modified   Complete by: As directed    Increase activity slowly   Complete by: As directed      Allergies as of 01/17/2021      Reactions   Dust Mite Extract Anaphylaxis   Other Anaphylaxis, Swelling, Rash, Other (See Comments)   Grass and dog dander- sneeze and throat swells-- long-haired dogs Pollen- Anaphylaxis   Penicillins Hives   Aspirin Nausea Only, Other (See Comments)   Patient can tolerate 81 mg ONLY- stated his neck swells at any dose >81 mg- "looks like I have the measles"   Ibuprofen Nausea And Vomiting, Swelling, Other (See Comments)   Causes Severe headaches- "looks like I have the measles"   Latex Rash   Tape Rash      Medication List    STOP taking these medications   carvedilol 12.5 MG tablet Commonly known as: COREG   diazepam 5 MG tablet Commonly known as: VALIUM   HYDROcodone-acetaminophen 5-325 MG tablet Commonly known as: Norco   nicotine 21 mg/24hr patch Commonly known as: NICODERM CQ - dosed in mg/24 hours     TAKE these medications   albuterol 108 (90 Base) MCG/ACT inhaler Commonly known as: VENTOLIN HFA Inhale 2 puffs into the lungs every 6 (six) hours as needed for wheezing or shortness of breath.   aspirin EC 81 MG tablet Take 81 mg by mouth daily.   atorvastatin 80 MG tablet Commonly known as: LIPITOR TAKE 1  TABLET BY MOUTH DAILY IN THE EVENING AT 6:00PM What changed: See the new instructions.   blood glucose meter kit and supplies 1 each by Other route as directed. Dispense based on patient and insurance preference. Use up to four times daily as directed. (FOR ICD-10 E10.9, E11.9).   calcium carbonate 500 MG chewable tablet Commonly known as: TUMS - dosed in mg elemental calcium Chew 2 tablets by mouth as needed for indigestion or heartburn.   escitalopram 10 MG tablet Commonly known as: LEXAPRO Take 10 mg by mouth daily.   famotidine 20 MG tablet Commonly known as: PEPCID TAKE 1 TABLET BY MOUTH DAILY AS NEEDED FOR HEARTBURN OR INDIGESTION What changed: See the new instructions.   furosemide 40 MG tablet Commonly known as: LASIX Take 1 tablet (40 mg total) by mouth daily.   glipiZIDE 5 MG tablet Commonly known as: GLUCOTROL TAKE 1 TABLET BY MOUTH IN THE MORNING BEFORE BREAKFAST What changed: See the new instructions.   insulin lispro 100 UNIT/ML KwikPen Commonly known as: HumaLOG KwikPen Inject 2-15 Units into the skin in the morning, at noon, in the evening, and at bedtime following sliding scale. Max daily dose 60U, Dx: E11.9   losartan 50 MG tablet Commonly known as: COZAAR Take 50 mg by mouth daily.   metFORMIN 500 MG tablet Commonly known as: GLUCOPHAGE Take 2 tablets (1,000 mg total) by mouth 2 (two) times  daily with a meal.   nebivolol 5 MG tablet Commonly known as: BYSTOLIC Take 1 tablet (5 mg total) by mouth daily. Start taking on: January 18, 2021   nitroGLYCERIN 0.4 MG SL tablet Commonly known as: Nitrostat Place 1 tablet (0.4 mg total) under the tongue every 5 (five) minutes as needed for chest pain.   pantoprazole 40 MG tablet Commonly known as: PROTONIX Take 1 tablet (40 mg total) by mouth at bedtime.   predniSONE 20 MG tablet Commonly known as: DELTASONE Take 2 tablets (40 mg total) by mouth daily with breakfast for 7 days. What changed:   how much  to take  how to take this  when to take this  additional instructions   Trelegy Ellipta 100-62.5-25 MCG/INH Aepb Generic drug: Fluticasone-Umeclidin-Vilant Inhale 1 Inhaler into the lungs every morning. What changed:   how much to take  when to take this            Durable Medical Equipment  (From admission, onward)         Start     Ordered   01/17/21 1051  For home use only DME oxygen  Once       Question Answer Comment  Length of Need 6 Months   Mode or (Route) Nasal cannula   Liters per Minute 2   Frequency Continuous (stationary and portable oxygen unit needed)   Oxygen conserving device Yes   Oxygen delivery system Gas      01/17/21 1051          Follow-up Information    [facility] Follow up.   Specialty: Pulmonology Why: Please contact our office to schedule appointment for hospital follow-up Contact information: [address] [place] [address] [phone]       [name], MD. Schedule an appointment as soon as possible for a visit in 1 week(s).   Specialty: Family Medicine Why: With repeat CBC/BMP Contact information: [address] [place] [address] [phone]        Constance Haw, MD .   Specialty: Cardiology Contact information: [address] [place] [address] [phone]        Martinique, Peter M, MD .   Specialty: Cardiology Contact information: [address] [place] [address] [phone]              Allergies  Allergen Reactions  . Dust Mite Extract Anaphylaxis  . Other Anaphylaxis, Swelling, Rash and Other (See Comments)    Grass and dog dander- sneeze and throat swells-- long-haired dogs  Pollen- Anaphylaxis  . Penicillins Hives  . Aspirin Nausea Only and Other (See Comments)    Patient can tolerate 81 mg ONLY- stated his neck swells at any dose >81 mg- "looks like I have the measles"  . Ibuprofen Nausea  And Vomiting, Swelling and Other (See Comments)    Causes Severe headaches- "looks like I have the measles"  . Latex Rash  . Tape Rash    Consultations: Pulmonary/cardiology  Procedures/Studies: DG Chest 2 View  Result Date: 01/11/2021 CLINICAL DATA:  Shortness of breath EXAM: CHEST - 2 VIEW COMPARISON:  09/23/2020 FINDINGS: Cardiac shadow is within normal limits. Mild bibasilar scarring is noted stable from the prior exam. Pacing device is again seen and stable. No focal infiltrate or sizable effusion is seen. No bony abnormality is noted. IMPRESSION: Chronic scarring in the lung bases bilaterally. No acute abnormality noted. Electronically  Signed   By: Inez Catalina M.D.   On: 01/11/2021 22:51   CUP PACEART REMOTE DEVICE CHECK  Result Date: 12/23/2020 Scheduled remote reviewed. Normal device function.  There was one NSVT arrhythmia detected that appears to be supraventricular in nature.  There were five fast A&V events detected that were AF with RVR.  There was one three hour episode of AF, ? [place].  Sent to triage. Next remote 91 days. Kathy Breach, RN, CCDS, [employer]      Subjective: Patient seen and examined at bedside.  No overnight fever, vomiting, chest pain or worsening shortness of breath.    Discharge Exam: Vitals:   01/17/21 0815 01/17/21 1130  BP: 118/73 (!) 106/54  Pulse: 70 69  Resp: 20 20  Temp:    SpO2: 99% 100%    General exam:  Looks chronically ill.  No distress.  Currently on 2 L oxygen via nasal cannula.   Respiratory system: Bilateral decreased breath sounds at bases with some crackles cardiovascular system: S1-S2 heard, rate controlled Gastrointestinal system: Abdomen is mildly distended, soft and nontender.  Normal bowel sounds heard  extremities: Trace lower extremity edema present; no clubbing    The results of significant diagnostics from this hospitalization (including imaging, microbiology, ancillary and laboratory) are listed below for  reference.     Microbiology: Recent Results (from the past 240 hour(s))  SARS CORONAVIRUS 2 (TAT 6-24 HRS) Nasopharyngeal Nasopharyngeal Swab     Status: None   Collection Time: 01/12/21  3:01 AM   Specimen: Nasopharyngeal Swab  Result Value Ref Range Status   SARS Coronavirus 2 NEGATIVE NEGATIVE Final    Comment: (NOTE) SARS-CoV-2 target nucleic acids are NOT DETECTED.  The SARS-CoV-2 RNA is generally detectable in upper and lower respiratory specimens during the acute phase of infection. Negative results do not preclude SARS-CoV-2 infection, do not rule out co-infections with other pathogens, and should not be used as the sole basis for treatment or other patient management decisions. Negative results must be combined with clinical observations, patient history, and epidemiological information. The expected result is Negative.  Fact Sheet for Patients: SugarRoll.be  Fact Sheet for Healthcare Providers: https://www.woods-mathews.com/  This test is not yet approved or cleared by the [place] FDA and  has been authorized for detection and/or diagnosis of SARS-CoV-2 by FDA under an Emergency Use Authorization (EUA). This EUA will remain  in effect (meaning this test can be used) for the duration of the COVID-19 declaration under Se ction 564(b)(1) of the Act, 21 U.S.C. section 360bbb-3(b)(1), unless the authorization is terminated or revoked sooner.  Performed at [facility], [place] [address], [place], [place] [address]      Labs: BNP (last 3 results) Recent Labs    06/28/20 2015 01/12/21 0231  BNP 29.8 02.7   Basic Metabolic Panel: Recent Labs  Lab 01/13/21 0031 01/14/21 0213 01/15/21 0104 01/16/21 0430 01/17/21 0248  NA 131* 132* 133* 131* 136  K 3.4* 4.2 4.8 4.4 4.5  CL 87* 89* 86* 86* 88*  CO2 35* 37* 42* 41* 44*  GLUCOSE 278* 232* 126* 197* 122*  BUN _0 CREATININE 0.78 0.65 0.65 0.56*  0.59*  CALCIUM 8.9 8.7* 9.0 8.5* 8.6*  MG 2.1 2.4 2.6* 2.3 2.4   Liver Function Tests: Recent Labs  Lab 01/12/21 0231  AST 21  ALT 30  ALKPHOS 63  BILITOT 0.4  PROT 6.2*  ALBUMIN 3.3*   No results  for input(s): LIPASE, AMYLASE in the last 168 hours. No results for input(s): AMMONIA in the last 168 hours. CBC: Recent Labs  Lab 01/12/21 0336 01/12/21 0645 01/13/21 0031 01/14/21 0213 01/15/21 0104 01/17/21 0248  WBC 10.1  --  7.1 9.8 12.5* 8.6  NEUTROABS  --   --   --   --  10.6* 7.3  HGB 10.3* 11.2* 10.9* 10.3* 10.4* 10.0*  HCT 33.5* 33.0* 33.2* 33.2* 32.4* 30.9*  MCV 92.5  --  88.1 90.7 89.5 90.1  PLT 198  --  220 219 221 173   Cardiac Enzymes: No results for input(s): CKTOTAL, CKMB, CKMBINDEX, TROPONINI in the last 168 hours. BNP: Invalid input(s): POCBNP CBG: Recent Labs  Lab 01/16/21 1919 01/17/21 0015 01/17/21 0353 01/17/21 0804 01/17/21 1128  GLUCAP 455* 180* 137* 207* 213*   D-Dimer No results for input(s): DDIMER in the last 72 hours. Hgb A1c No results for input(s): HGBA1C in the last 72 hours. Lipid Profile No results for input(s): CHOL, HDL, LDLCALC, TRIG, CHOLHDL, LDLDIRECT in the last 72 hours. Thyroid function studies No results for input(s): TSH, T4TOTAL, T3FREE, THYROIDAB in the last 72 hours.  Invalid input(s): FREET3 Anemia work up No results for input(s): VITAMINB12, FOLATE, FERRITIN, TIBC, IRON, RETICCTPCT in the last 72 hours. Urinalysis    Component Value Date/Time   COLORURINE YELLOW 11/16/2017 1025   APPEARANCEUR CLEAR 11/16/2017 1025   LABSPEC 1.003 11/16/2017 1025   PHURINE 6.5 11/16/2017 1025   GLUCOSEU NEGATIVE 11/16/2017 1025   HGBUR TRACE (A) 11/16/2017 1025   BILIRUBINUR NEGATIVE 01/11/2012 1553   KETONESUR NEGATIVE 11/16/2017 1025   PROTEINUR NEGATIVE 11/16/2017 1025   UROBILINOGEN 0.2 01/11/2012 1553   NITRITE NEGATIVE 11/16/2017 1025   LEUKOCYTESUR NEGATIVE 11/16/2017 1025   Sepsis Labs Invalid input(s):  PROCALCITONIN,  WBC,  LACTICIDVEN Microbiology Recent Results (from the past 240 hour(s))  SARS CORONAVIRUS 2 (TAT 6-24 HRS) Nasopharyngeal Nasopharyngeal Swab     Status: None   Collection Time: 01/12/21  3:01 AM   Specimen: Nasopharyngeal Swab  Result Value Ref Range Status   SARS Coronavirus 2 NEGATIVE NEGATIVE Final    Comment: (NOTE) SARS-CoV-2 target nucleic acids are NOT DETECTED.  The SARS-CoV-2 RNA is generally detectable in upper and lower respiratory specimens during the acute phase of infection. Negative results do not preclude SARS-CoV-2 infection, do not rule out co-infections with other pathogens, and should not be used as the sole basis for treatment or other patient management decisions. Negative results must be combined with clinical observations, patient history, and epidemiological information. The expected result is Negative.  Fact Sheet for Patients: SugarRoll.be  Fact Sheet for Healthcare Providers: https://www.woods-mathews.com/  This test is not yet approved or cleared by the [place] FDA and  has been authorized for detection and/or diagnosis of SARS-CoV-2 by FDA under an Emergency Use Authorization (EUA). This EUA will remain  in effect (meaning this test can be used) for the duration of the COVID-19 declaration under Se ction 564(b)(1) of the Act, 21 U.S.C. section 360bbb-3(b)(1), unless the authorization is terminated or revoked sooner.  Performed at [facility], [place] [address], [place], [place] [address]      Time coordinating discharge: 35 minutes  SIGNED:   Aline August, MD  [facility] 01/17/2021, 1:01 PM

## 2021-01-17 NOTE — TOC Transition Note (Signed)
Transition of Care [facility]) - CM/SW Discharge Note   Patient Details  Name: Todd [name] MRN: [id number] Date of Birth: 1961/05/14  [facility]) CM/SW Contact:  Joanne Chars, LCSW Phone Number: 01/17/2021, 2:15 PM   Clinical Narrative:   Pt discharging home today with [facility].  [employer] provided trilogy and will deliver today and meet with pt.  No other needs identified.     Final next level of care: [place] Barriers to Discharge: Barriers Resolved   Patient Goals and CMS Choice Patient states their goals for this hospitalization and ongoing recovery are:: "pain free life" CMS Medicare.gov Compare Post Acute Care list provided to:: Patient Choice offered to / list presented to : Patient  Discharge Placement                       Discharge Plan and Services In-house Referral: Clinical Social Work   Post Acute Care Choice: Home Health          DME Arranged: N/A DME Agency: NA       HH Arranged: PT,OT [place] Agency: [place] Date HH Agency Contacted: 01/17/21 Time [place]: [id number] Representative spoke with at [place]: [place] ([place]) Interventions     Readmission Risk Interventions No flowsheet data found.

## 2021-01-17 NOTE — Care Management Important Message (Signed)
Important Message  Patient Details  Name: Todd [name] MRN: [id number] Date of Birth: 1961/10/29   Medicare Important Message Given:  Yes     Eustolia Drennen [address] 01/17/2021, 4:08 PM

## 2021-01-17 NOTE — TOC Progression Note (Addendum)
Transition of Care [facility]) - Progression Note    Patient Details  Name: Todd [name] MRN: [id number] Date of Birth: [birth year]  Transition of Care [facility]) CM/SW Contact  Joanne Chars, LCSW Phone Number: 01/17/2021, 10:07 AM  Clinical Narrative:   Per Edwyna Ready at [place], [facility] authorization for trilogy still pending.   1220: Trilogy approved per [place].  [employer] will deliver today.  Pt has one tank home O2 in his room.  Family will transport home.      Expected Discharge Plan: [place] Barriers to Discharge: Continued Medical Work up,Other (comment) (needing HH with [facility] Medicare)  Expected Discharge Plan and Services Expected Discharge Plan: [place] In-house Referral: Clinical Social Work   Post Acute Care Choice: [place] arrangements for the past 2 months: Single Family Home                                       Social Determinants of Health (SDOH) Interventions    Readmission Risk Interventions No flowsheet data found.

## 2021-01-18 DIAGNOSIS — R269 Unspecified abnormalities of gait and mobility: Secondary | ICD-10-CM | POA: Diagnosis not present

## 2021-01-18 DIAGNOSIS — J9811 Atelectasis: Secondary | ICD-10-CM | POA: Diagnosis not present

## 2021-01-18 DIAGNOSIS — J449 Chronic obstructive pulmonary disease, unspecified: Secondary | ICD-10-CM | POA: Diagnosis not present

## 2021-01-19 DIAGNOSIS — J9621 Acute and chronic respiratory failure with hypoxia: Secondary | ICD-10-CM | POA: Diagnosis not present

## 2021-01-19 DIAGNOSIS — I152 Hypertension secondary to endocrine disorders: Secondary | ICD-10-CM | POA: Diagnosis not present

## 2021-01-19 DIAGNOSIS — J9622 Acute and chronic respiratory failure with hypercapnia: Secondary | ICD-10-CM | POA: Diagnosis not present

## 2021-01-19 DIAGNOSIS — E1159 Type 2 diabetes mellitus with other circulatory complications: Secondary | ICD-10-CM | POA: Diagnosis not present

## 2021-01-19 DIAGNOSIS — I5032 Chronic diastolic (congestive) heart failure: Secondary | ICD-10-CM | POA: Diagnosis not present

## 2021-01-19 DIAGNOSIS — J441 Chronic obstructive pulmonary disease with (acute) exacerbation: Secondary | ICD-10-CM | POA: Diagnosis not present

## 2021-01-24 ENCOUNTER — Other Ambulatory Visit: Payer: Self-pay | Admitting: Family Medicine

## 2021-01-24 ENCOUNTER — Other Ambulatory Visit: Payer: Self-pay

## 2021-01-24 ENCOUNTER — Encounter: Payer: Self-pay | Admitting: Family Medicine

## 2021-01-24 ENCOUNTER — Ambulatory Visit (INDEPENDENT_AMBULATORY_CARE_PROVIDER_SITE_OTHER): Payer: Medicare Other | Admitting: Family Medicine

## 2021-01-24 VITALS — BP 116/82 | HR 74 | Temp 98.4°F | Resp 14 | Ht 67.0 in | Wt 203.0 lb

## 2021-01-24 DIAGNOSIS — I50811 Acute right heart failure: Secondary | ICD-10-CM

## 2021-01-24 DIAGNOSIS — I5033 Acute on chronic diastolic (congestive) heart failure: Secondary | ICD-10-CM

## 2021-01-24 DIAGNOSIS — J431 Panlobular emphysema: Secondary | ICD-10-CM | POA: Diagnosis not present

## 2021-01-24 DIAGNOSIS — J439 Emphysema, unspecified: Secondary | ICD-10-CM

## 2021-01-24 MED ORDER — LORAZEPAM 0.5 MG PO TABS: 0.5000 mg | ORAL_TABLET | Freq: Every evening | ORAL | 0 refills | Status: DC | PRN
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
## 2021-01-25 LAB — CBC WITH DIFFERENTIAL/PLATELET
Absolute Monocytes: 451 cells/uL (ref 200–950)
Basophils Absolute: 9 cells/uL (ref 0–200)
Basophils Relative: 0.1 %
Eosinophils Absolute: 0 cells/uL — ABNORMAL LOW (ref 15–500)
Eosinophils Relative: 0 %
HCT: 33.5 % — ABNORMAL LOW (ref 38.5–50.0)
Hemoglobin: 10.6 g/dL — ABNORMAL LOW (ref 13.2–17.1)
Lymphs Abs: 681 cells/uL — ABNORMAL LOW (ref 850–3900)
MCH: 28 pg (ref 27.0–33.0)
MCHC: 31.6 g/dL — ABNORMAL LOW (ref 32.0–36.0)
MCV: 88.4 fL (ref 80.0–100.0)
MPV: 10 fL (ref 7.5–12.5)
Monocytes Relative: 4.9 %
Neutro Abs: 8059 cells/uL — ABNORMAL HIGH (ref 1500–7800)
Neutrophils Relative %: 87.6 %
Platelets: 220 10*3/uL (ref 140–400)
RBC: 3.79 10*6/uL — ABNORMAL LOW (ref 4.20–5.80)
RDW: 12.3 % (ref 11.0–15.0)
Total Lymphocyte: 7.4 %
WBC: 9.2 10*3/uL (ref 3.8–10.8)

## 2021-01-25 LAB — COMPLETE METABOLIC PANEL WITH GFR
AG Ratio: 2 (calc) (ref 1.0–2.5)
ALT: 31 U/L (ref 9–46)
AST: 17 U/L (ref 10–35)
Albumin: 3.8 g/dL (ref 3.6–5.1)
Alkaline phosphatase (APISO): 61 U/L (ref 35–144)
BUN/Creatinine Ratio: 27 (calc) — ABNORMAL HIGH (ref 6–22)
BUN: 13 mg/dL (ref 7–25)
CO2: 44 mmol/L — ABNORMAL HIGH (ref 20–32)
Calcium: 8.7 mg/dL (ref 8.6–10.3)
Chloride: 88 mmol/L — ABNORMAL LOW (ref 98–110)
Creat: 0.48 mg/dL — ABNORMAL LOW (ref 0.70–1.25)
GFR, Est African American: 137 mL/min/{1.73_m2} (ref >=60)
GFR, Est Non African American: 118 mL/min/{1.73_m2} (ref >=60)
Globulin: 1.9 g/dL (calc) (ref 1.9–3.7)
Glucose, Bld: 238 mg/dL — ABNORMAL HIGH (ref 65–99)
Potassium: 3.9 mmol/L (ref 3.5–5.3)
Sodium: 141 mmol/L (ref 135–146)
Total Bilirubin: 0.3 mg/dL (ref 0.2–1.2)
Total Protein: 5.7 g/dL — ABNORMAL LOW (ref 6.1–8.1)

## 2021-01-27 ENCOUNTER — Telehealth: Payer: Self-pay | Admitting: *Deleted

## 2021-01-27 NOTE — Telephone Encounter (Signed)
Received call from patient daughter.   Reports that patient is currently taking Lorazepam 0.5mg  PO Q HS to assist with anxiety with using Bi-PAP. States that on the first night, medication was helpful and patient was able to use Bi-PAP throughout the night. States that since then he continues to be restless and removes Bi-PAP due to anxiety.   States that medication is given 10 minutes prior to bedtime.   Advised to increase time to 30 minutes prior to bedtime so medication has time to become effective prior to using Bi-PAP.   Advised to keep appointment scheduled on 01/28/2021 to discuss with PCP.

## 2021-01-28 ENCOUNTER — Other Ambulatory Visit: Payer: Self-pay

## 2021-01-28 ENCOUNTER — Ambulatory Visit (INDEPENDENT_AMBULATORY_CARE_PROVIDER_SITE_OTHER): Payer: Medicare Other | Admitting: Family Medicine

## 2021-01-28 ENCOUNTER — Other Ambulatory Visit: Payer: Self-pay | Admitting: Family Medicine

## 2021-01-28 ENCOUNTER — Encounter: Payer: Self-pay | Admitting: Family Medicine

## 2021-01-28 VITALS — BP 118/74 | HR 88 | Temp 98.2°F | Resp 14 | Ht 67.0 in | Wt 203.0 lb

## 2021-01-28 DIAGNOSIS — I50811 Acute right heart failure: Secondary | ICD-10-CM | POA: Diagnosis not present

## 2021-01-28 DIAGNOSIS — E118 Type 2 diabetes mellitus with unspecified complications: Secondary | ICD-10-CM | POA: Diagnosis not present

## 2021-01-28 DIAGNOSIS — R600 Localized edema: Secondary | ICD-10-CM

## 2021-01-28 DIAGNOSIS — J431 Panlobular emphysema: Secondary | ICD-10-CM

## 2021-01-28 MED ORDER — EMPAGLIFLOZIN 25 MG PO TABS: 25.0000 mg | ORAL_TABLET | Freq: Every day | ORAL | 3 refills | Status: DC
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
## 2021-02-01 DIAGNOSIS — J9621 Acute and chronic respiratory failure with hypoxia: Secondary | ICD-10-CM | POA: Diagnosis not present

## 2021-02-01 DIAGNOSIS — E1159 Type 2 diabetes mellitus with other circulatory complications: Secondary | ICD-10-CM | POA: Diagnosis not present

## 2021-02-01 DIAGNOSIS — J441 Chronic obstructive pulmonary disease with (acute) exacerbation: Secondary | ICD-10-CM | POA: Diagnosis not present

## 2021-02-01 DIAGNOSIS — I5032 Chronic diastolic (congestive) heart failure: Secondary | ICD-10-CM | POA: Diagnosis not present

## 2021-02-01 DIAGNOSIS — I152 Hypertension secondary to endocrine disorders: Secondary | ICD-10-CM | POA: Diagnosis not present

## 2021-02-01 DIAGNOSIS — J9622 Acute and chronic respiratory failure with hypercapnia: Secondary | ICD-10-CM | POA: Diagnosis not present

## 2021-02-03 DIAGNOSIS — I5032 Chronic diastolic (congestive) heart failure: Secondary | ICD-10-CM | POA: Diagnosis not present

## 2021-02-03 DIAGNOSIS — J9621 Acute and chronic respiratory failure with hypoxia: Secondary | ICD-10-CM | POA: Diagnosis not present

## 2021-02-03 DIAGNOSIS — J441 Chronic obstructive pulmonary disease with (acute) exacerbation: Secondary | ICD-10-CM | POA: Diagnosis not present

## 2021-02-03 DIAGNOSIS — J9622 Acute and chronic respiratory failure with hypercapnia: Secondary | ICD-10-CM | POA: Diagnosis not present

## 2021-02-03 DIAGNOSIS — E1159 Type 2 diabetes mellitus with other circulatory complications: Secondary | ICD-10-CM | POA: Diagnosis not present

## 2021-02-03 DIAGNOSIS — I152 Hypertension secondary to endocrine disorders: Secondary | ICD-10-CM | POA: Diagnosis not present

## 2021-02-04 ENCOUNTER — Encounter: Payer: Self-pay | Admitting: Family Medicine

## 2021-02-04 ENCOUNTER — Other Ambulatory Visit: Payer: Self-pay

## 2021-02-04 ENCOUNTER — Ambulatory Visit (INDEPENDENT_AMBULATORY_CARE_PROVIDER_SITE_OTHER): Payer: Medicare Other | Admitting: Family Medicine

## 2021-02-04 VITALS — BP 120/72 | HR 77 | Temp 97.1°F | Ht 67.0 in | Wt 207.0 lb

## 2021-02-04 DIAGNOSIS — J9811 Atelectasis: Secondary | ICD-10-CM | POA: Diagnosis not present

## 2021-02-04 DIAGNOSIS — R269 Unspecified abnormalities of gait and mobility: Secondary | ICD-10-CM | POA: Diagnosis not present

## 2021-02-04 DIAGNOSIS — I50811 Acute right heart failure: Secondary | ICD-10-CM

## 2021-02-04 DIAGNOSIS — J449 Chronic obstructive pulmonary disease, unspecified: Secondary | ICD-10-CM | POA: Diagnosis not present

## 2021-02-04 DIAGNOSIS — R600 Localized edema: Secondary | ICD-10-CM
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
## 2021-02-05 LAB — BASIC METABOLIC PANEL WITH GFR
BUN: 15 mg/dL (ref 7–25)
CO2: 37 mmol/L — ABNORMAL HIGH (ref 20–32)
Calcium: 8.7 mg/dL (ref 8.6–10.3)
Chloride: 90 mmol/L — ABNORMAL LOW (ref 98–110)
Creat: 0.85 mg/dL (ref 0.70–1.25)
GFR, Est African American: 108 mL/min/{1.73_m2} (ref >=60)
GFR, Est Non African American: 93 mL/min/{1.73_m2} (ref >=60)
Glucose, Bld: 97 mg/dL (ref 65–99)
Potassium: 3.3 mmol/L — ABNORMAL LOW (ref 3.5–5.3)
Sodium: 141 mmol/L (ref 135–146)

## 2021-02-06 DIAGNOSIS — R269 Unspecified abnormalities of gait and mobility: Secondary | ICD-10-CM | POA: Diagnosis not present

## 2021-02-06 DIAGNOSIS — J449 Chronic obstructive pulmonary disease, unspecified: Secondary | ICD-10-CM | POA: Diagnosis not present

## 2021-02-06 DIAGNOSIS — J9811 Atelectasis: Secondary | ICD-10-CM | POA: Diagnosis not present

## 2021-02-07 ENCOUNTER — Encounter: Payer: Self-pay | Admitting: Family Medicine

## 2021-02-07 ENCOUNTER — Ambulatory Visit (INDEPENDENT_AMBULATORY_CARE_PROVIDER_SITE_OTHER): Payer: Medicare Other | Admitting: Emergency Medicine

## 2021-02-07 ENCOUNTER — Other Ambulatory Visit: Payer: Self-pay

## 2021-02-07 ENCOUNTER — Ambulatory Visit (INDEPENDENT_AMBULATORY_CARE_PROVIDER_SITE_OTHER): Payer: Medicare Other | Admitting: Family Medicine

## 2021-02-07 ENCOUNTER — Encounter: Payer: Self-pay | Admitting: Emergency Medicine

## 2021-02-07 VITALS — BP 124/66 | HR 86 | Temp 98.5°F | Resp 14 | Ht 67.0 in | Wt 205.0 lb

## 2021-02-07 DIAGNOSIS — Z720 Tobacco use: Secondary | ICD-10-CM | POA: Diagnosis not present

## 2021-02-07 DIAGNOSIS — E118 Type 2 diabetes mellitus with unspecified complications: Secondary | ICD-10-CM

## 2021-02-07 DIAGNOSIS — J9622 Acute and chronic respiratory failure with hypercapnia: Secondary | ICD-10-CM

## 2021-02-07 DIAGNOSIS — J431 Panlobular emphysema: Secondary | ICD-10-CM | POA: Diagnosis not present

## 2021-02-07 DIAGNOSIS — I50811 Acute right heart failure: Secondary | ICD-10-CM

## 2021-02-07 DIAGNOSIS — J9811 Atelectasis: Secondary | ICD-10-CM

## 2021-02-07 DIAGNOSIS — J449 Chronic obstructive pulmonary disease, unspecified: Secondary | ICD-10-CM | POA: Diagnosis not present

## 2021-02-07 DIAGNOSIS — R600 Localized edema: Secondary | ICD-10-CM

## 2021-02-07 MED ORDER — POTASSIUM CHLORIDE CRYS ER 20 MEQ PO TBCR: 20.0000 meq | EXTENDED_RELEASE_TABLET | Freq: Every day | ORAL | 3 refills | Status: DC

## 2021-02-07 NOTE — Patient Instructions (Signed)
Please continue Trelegy 1 inhalation once daily as you have been taking it.  Rinse and gargle after using. Keep your albuterol available use either 2 puffs or 1 nebulizer treatment up to every 4 hours when needed for shortness of breath, chest tightness, wheezing. Continue your oxygen at 2-3 L/min depending on your level of exertion. We will arrange for a sleep study with BiPAP/ventilator titration so that we can adjust the settings on your nocturnal ventilator (trilogy) appropriately You are due for a repeat CT scan of your chest.  We will work on arranging this for you Continue your diuretic medications as directed by Dr. Dennard Schaumann and cardiology. Work hard to stop smoking completely. Follow with Dr. Lamonte Sakai in 2 months or sooner if you have any problems.

## 2021-02-07 NOTE — Addendum Note (Signed)
Addended by: Gavin Potters R on: 02/07/2021 02:48 PM   Modules accepted: Orders

## 2021-02-07 NOTE — Progress Notes (Signed)
Subjective:    Patient ID: Todd [name], male    DOB: December 08, 1960, 60 y.o.   MRN: [id number]  HPI 60 year old active smoker (40 pack years) with a history of diabetes, CAD/MI (PTCI to LAD, medical management), hyperlipidemia, hypertension.  He is referred today for an abnormal CT scan of the chest.  He underwent a chest x-ray 07/25/2020 for cough that showed persistent right middle lobe consolidation.  A CT chest done 08/01/2020 was reviewed by me, shows some emphysematous change bilaterally, an area of large patchy consolidation, rounded, in the right middle lobe with focal narrowing of the origin of his right middle lobe bronchus and mild nodularity of his right mainstem bronchus extending distally.  There is also a 4 mm right middle lobe nodule.  ROV 09/06/20 --Todd [name] has a history of tobacco use, diabetes, coronary disease, hyperlipidemia, hypertension.  He is here today to follow-up his right middle lobe consolidation and his bronchoscopy that was done 08/26/2020.  There were raised smooth lesions throughout the bronchial tree especially on the right.  There was extrinsic compression of the right middle lobe airway and it could not be entered.  The largest lesion was in the distal bronchus intermedius and this was biopsied by forceps, needle, brushed.  The forceps biopsies showed evidence for benign cartilage and bronchial wall material, needle biopsy showed fragments of benign cartilage and inflammation with scant cellularity.  Brushings were negative.  He has been on Trelegy before, may have benefited some. The cost was high - $48, but he would be willing to retry. Has albuterol, uses about 3x a day. He does still have spells of coughing. Smoking about 2-3 cig a day.   ROV 11/17/20 --follow-up visit 60 year old man with active tobacco use, diabetes, CAD, hyperlipidemia, hypertension.  We performed bronchoscopy 08/26/2020 to evaluate right middle lobe collapse.  This showed raised smooth lesions in  the bronchial tree especially on the right with extrinsic compression of the right middle lobe airway.  Biopsies and sampling revealed benign cartilage and bronchial wall material without evidence for malignancy.  We discussed possibly setting up rigid bronchoscopy once he gets change in insurance confirmed.  In the meantime he was hospitalized 11/15 for syncope and had to have a pacemaker placed on 09/22/2020. He was d/c on O2 w exertion 2L/min.  He is on Trelegy. Feels that he is having some episode of panic. He feels more SOB. Uses albuterol about 2-3x a day. Gets relief from this.  He stopped smoking completely 10/07/20.   Left/right heart cath 09/21/2020 showed 100 sent RCA lesion with collaterals, other noncritical CAD, moderately elevated LVEDP 25 mmHg, patent LAD stent  Hospital follow-up 02/07/21 --Todd [name] has been quite ill since I last saw him.  He 1 with history of tobacco, severe COPD, OSA/OHS.  I have seen him for right middle lobe collapse, bronchoscopy without raised smooth lesions that were biopsied and overall benign with some cartilage and bronchial wall material.  He has been hospitalized twice for respiratory failure, most recently discharged about 1 month ago on noninvasive ventilation.  Bronchodilators include Trelegy, albuterol which he uses approximately 2-4x a day, does get benefit.  Oxygen which he uses at 2-3 L/min. He using his NIPPV reliably. Scheduled for OV w Neuro 4/5. Needs a PSG and titration study.   Repeat CT chest not yet done   Review of Systems As per HPI     Objective:   Physical Exam  Vitals:   02/07/21 1409  BP: 118/74  Pulse: 90  Temp: 97.8 F (36.6 C)  TempSrc: Temporal  SpO2: 100%  Weight: 202 lb 9.6 oz (91.9 kg)  Height: 5\' 7"  (1.702 m)   Gen: 60, elderly man, in no distress,  normal affect on oxygen  ENT: No lesions,  mouth clear,  oropharynx clear, no postnasal drip  Neck: No JVD, no stridor  Lungs: No use of accessory muscles,  very distant, no basilar crackles, no wheezing  Cardiovascular: RRR, heart sounds normal, no murmur or gallops, 3+ tight lower extremity edema  Musculoskeletal: No deformities, no cyanosis or clubbing  Neuro: alert, awake, non focal  Skin: Warm, some erythema in the lower extremities, venous stasis changes     Assessment & Plan:  Acute and chronic respiratory failure with hypercapnia [facility]) [name] is chronic respiratory failure and his nocturnal hypoxemia, hypercapnia are principally due to his multifactorial combined respiratory failure as opposed to traditional OSA.  He is using a trilogy ventilator.  He needs a nocturnal study with NIPPV/BiPAP titration.  We will arrange for this.  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  He needs repeat imaging.  Has not been done because he has dyspnea and some panic when he has to lay flat.  He is better compensated right now with regard to his respiratory failure, COPD, CHF and I believe we can get this scheduled.  COPD (chronic obstructive pulmonary disease) (HCC) Very severe disease.  We will plan to continue his Trelegy, albuterol, NIPPV.  He may ultimately require scheduled steroids, rotating antibiotics.  Tobacco abuse Needs cessation  Todd Apo, MD, PhD 02/07/2021, 2:34 PM [facility] [phone] or if no answer [phone]

## 2021-02-07 NOTE — Assessment & Plan Note (Signed)
Right middle lobe collapse with reassuring bronchoscopy.  He needs repeat imaging.  Has not been done because he has dyspnea and some panic when he has to lay flat.  He is better compensated right now with regard to his respiratory failure, COPD, CHF and I believe we can get this scheduled.

## 2021-02-07 NOTE — Assessment & Plan Note (Signed)
Mr. Bowens is chronic respiratory failure and his nocturnal hypoxemia, hypercapnia are principally due to his multifactorial combined respiratory failure as opposed to traditional OSA.  He is using a trilogy ventilator.  He needs a nocturnal study with NIPPV/BiPAP titration.  We will arrange for this.

## 2021-02-07 NOTE — Assessment & Plan Note (Signed)
Very severe disease.  We will plan to continue his Trelegy, albuterol, NIPPV.  He may ultimately require scheduled steroids, rotating antibiotics.

## 2021-02-07 NOTE — Assessment & Plan Note (Signed)
Needs cessation 
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
## 2021-02-08 ENCOUNTER — Institutional Professional Consult (permissible substitution): Payer: Medicare Other | Admitting: Neurology

## 2021-02-08 DIAGNOSIS — I5032 Chronic diastolic (congestive) heart failure: Secondary | ICD-10-CM | POA: Diagnosis not present

## 2021-02-08 DIAGNOSIS — I152 Hypertension secondary to endocrine disorders: Secondary | ICD-10-CM | POA: Diagnosis not present

## 2021-02-08 DIAGNOSIS — J9621 Acute and chronic respiratory failure with hypoxia: Secondary | ICD-10-CM | POA: Diagnosis not present

## 2021-02-08 DIAGNOSIS — J9622 Acute and chronic respiratory failure with hypercapnia: Secondary | ICD-10-CM | POA: Diagnosis not present

## 2021-02-08 DIAGNOSIS — J441 Chronic obstructive pulmonary disease with (acute) exacerbation: Secondary | ICD-10-CM | POA: Diagnosis not present

## 2021-02-08 DIAGNOSIS — E1159 Type 2 diabetes mellitus with other circulatory complications: Secondary | ICD-10-CM | POA: Diagnosis not present

## 2021-02-10 DIAGNOSIS — J9622 Acute and chronic respiratory failure with hypercapnia: Secondary | ICD-10-CM | POA: Diagnosis not present

## 2021-02-10 DIAGNOSIS — I152 Hypertension secondary to endocrine disorders: Secondary | ICD-10-CM | POA: Diagnosis not present

## 2021-02-10 DIAGNOSIS — E1159 Type 2 diabetes mellitus with other circulatory complications: Secondary | ICD-10-CM | POA: Diagnosis not present

## 2021-02-10 DIAGNOSIS — I5032 Chronic diastolic (congestive) heart failure: Secondary | ICD-10-CM | POA: Diagnosis not present

## 2021-02-10 DIAGNOSIS — J9621 Acute and chronic respiratory failure with hypoxia: Secondary | ICD-10-CM | POA: Diagnosis not present

## 2021-02-10 DIAGNOSIS — J441 Chronic obstructive pulmonary disease with (acute) exacerbation: Secondary | ICD-10-CM | POA: Diagnosis not present

## 2021-02-13 ENCOUNTER — Other Ambulatory Visit: Payer: Self-pay | Admitting: Family Medicine

## 2021-02-13 DIAGNOSIS — I50811 Acute right heart failure: Secondary | ICD-10-CM

## 2021-02-13 DIAGNOSIS — J431 Panlobular emphysema: Secondary | ICD-10-CM

## 2021-02-14 ENCOUNTER — Telehealth: Payer: Self-pay | Admitting: Family Medicine

## 2021-02-14 ENCOUNTER — Other Ambulatory Visit: Payer: Self-pay | Admitting: Nurse Practitioner

## 2021-02-14 DIAGNOSIS — J9811 Atelectasis: Secondary | ICD-10-CM | POA: Diagnosis not present

## 2021-02-14 DIAGNOSIS — I50811 Acute right heart failure: Secondary | ICD-10-CM

## 2021-02-14 DIAGNOSIS — J449 Chronic obstructive pulmonary disease, unspecified: Secondary | ICD-10-CM | POA: Diagnosis not present

## 2021-02-14 DIAGNOSIS — R269 Unspecified abnormalities of gait and mobility: Secondary | ICD-10-CM | POA: Diagnosis not present

## 2021-02-14 DIAGNOSIS — J431 Panlobular emphysema: Secondary | ICD-10-CM

## 2021-02-14 MED ORDER — NEBIVOLOL HCL 5 MG PO TABS: 5.0000 mg | ORAL_TABLET | Freq: Every day | ORAL | 3 refills | Status: DC

## 2021-02-14 MED ORDER — ESCITALOPRAM OXALATE 20 MG PO TABS: 20.0000 mg | ORAL_TABLET | Freq: Every day | ORAL | 3 refills | Status: DC

## 2021-02-14 MED ORDER — LORAZEPAM 0.5 MG PO TABS
ORAL_TABLET | ORAL | 0 refills | Status: DC
Start: 2021-02-14 — End: 2021-03-18

## 2021-02-14 NOTE — Telephone Encounter (Signed)
Ativan has been sent in previous message.   Prescriptions sent to pharmacy.

## 2021-02-14 NOTE — Telephone Encounter (Signed)
Ok to refill??  Last office visit 02/07/2021.  Last refill 01/24/2021.  Ok to add refill to prescription?

## 2021-02-14 NOTE — Progress Notes (Signed)
Daughter called after hours pager requesting wording be changed on lorazepam 0.5mg  prescription that was sent in today from 1 tablet PO qhs to 1-2 tablets PO qhs.  She states patient's PCP said 1-2 tablets was okay.  She states the pharmacy will not fill and patient does not keep his CPAP on at night without it.  Reports he fell last night because he did not wear his CPAP and is worried about his breathing at night without the CPAP.  PDMP reviewed - lorazepam sent in on 01/24/2021 #30 and daughter reports he has been out for 4 days.  I informed the daughter that prescription refills, especially controlled substances, are not usually granted after hours and I am making an exception this time.  Next time this will need to be handled during office hours. I will give a refill of this medication written for 1-2 tablets PO qhs prn without refills and will cancel the previous prescription.

## 2021-02-14 NOTE — Progress Notes (Signed)
  Chronic Care Management   Note  02/14/2021 Name: Todd [name] MRN: [id number] DOB: [birth year]  Todd [name] is a 60 y.o. year old male who is a primary care patient of Dennard Schaumann, Cammie Mcgee, MD. I reached out to [place] by phone today in response to a referral sent by [name]'s PCP, Dennard Schaumann Cammie Mcgee, MD.   [name] was given information about Chronic Care Management services today including:  1. CCM service includes personalized support from designated clinical staff supervised by his physician, including individualized plan of care and coordination with other care providers 2. 24/7 contact phone numbers for assistance for urgent and routine care needs. 3. Service will only be billed when office clinical staff spend 20 minutes or more in a month to coordinate care. 4. Only one practitioner may furnish and bill the service in a calendar month. 5. The patient may stop CCM services at any time (effective at the end of the month) by phone call to the office staff.   Patient agreed to services and verbal consent obtained.   Follow up plan:   Carley Perdue [employer] Scheduler

## 2021-02-14 NOTE — Telephone Encounter (Signed)
Patient's spouse [name] called to request refills for the following meds:   escitalopram (LEXAPRO) 10 MG tablet [[id number]]  dose changed to 20mg . Pharmacy needs note to indicate change as per [name] nebivolol (BYSTOLIC) 5 MG tablet [[id number]] ** last pill taken 4/10 ** LORazepam (ATIVAN) 0.5 MG tablet [[id number]] ** last pill taken 4/9 [id number] [address], [place] [address]  [place], [place] [address]  Phone:  [phone]  Fax:  [phone]  DEA #:  [id number]  Please call patient when meds called into pharmacy at [phone].

## 2021-02-15 ENCOUNTER — Telehealth: Payer: Self-pay | Admitting: Family Medicine

## 2021-02-15 ENCOUNTER — Encounter: Payer: Self-pay | Admitting: Cardiology

## 2021-02-15 ENCOUNTER — Other Ambulatory Visit: Payer: Self-pay

## 2021-02-15 ENCOUNTER — Ambulatory Visit (INDEPENDENT_AMBULATORY_CARE_PROVIDER_SITE_OTHER): Payer: Medicare Other | Admitting: Cardiology

## 2021-02-15 VITALS — BP 110/62 | HR 84 | Ht 67.0 in | Wt 197.0 lb

## 2021-02-15 DIAGNOSIS — I495 Sick sinus syndrome: Secondary | ICD-10-CM | POA: Diagnosis not present

## 2021-02-15 NOTE — Telephone Encounter (Signed)
Received call from [facility]; patient advised by Dr. Allegra Lai to increase furosemide (LASIX) 40 MG tablet [[id number]]   To 80 mg (40 mg bid). Kaitlyn stated Pickard already told him to increase to that dose 2 weeks ago. Change isn't reflected in patient' s MyChart. Should patient increase dose even more? Please advise at [phone].

## 2021-02-15 NOTE — Telephone Encounter (Signed)
Call placed to patient and patient daughter Todd [name] made aware.

## 2021-02-15 NOTE — Telephone Encounter (Signed)
4.  Lower extremity edema: Currently on Lasix.  Will increase Lasix to 80 mg twice daily for the next 4 days.  He will call his primary cardiologist if edema continues.  I copied this from Dr. Lennie Odor note today so I would agree with his recommendations above.

## 2021-02-15 NOTE — Telephone Encounter (Signed)
Patient has been taking Lasix 80mg  PO QD since 01/28/2021.  Please advise.
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
## 2021-02-15 NOTE — Patient Instructions (Signed)
Medication Instructions:  Your physician has recommended you make the following change in your medication:  1. INCREASE Lasix to 80 mg twice daily for the next 4 days, then return to normal dosing  *If you need a refill on your cardiac medications before your next appointment, please call your pharmacy*   Lab Work: None ordered   Testing/Procedures: None ordered   Follow-Up: At [facility], you and your health needs are our priority.  As part of our continuing mission to provide you with exceptional heart care, we have created designated Provider Care Teams.  These Care Teams include your primary Cardiologist (physician) and Advanced Practice Providers (APPs -  Physician Assistants and Nurse Practitioners) who all work together to provide you with the care you need, when you need it.  Remote monitoring is used to monitor your Pacemaker or ICD from home. This monitoring reduces the number of office visits required to check your device to one time per year. It allows [place] to keep an eye on the functioning of your device to ensure it is working properly. You are scheduled for a device check from home on 03/24/2021. You may send your transmission at any time that day. If you have a wireless device, the transmission will be sent automatically. After your physician reviews your transmission, you will receive a postcard with your next transmission date.  Your next appointment:   9 month(s)  The format for your next appointment:   In Person  Provider:   Allegra Lai, MD   Thank you for choosing [facility]!!   Trinidad Curet, RN [phone]

## 2021-02-16 DIAGNOSIS — J441 Chronic obstructive pulmonary disease with (acute) exacerbation: Secondary | ICD-10-CM | POA: Diagnosis not present

## 2021-02-16 DIAGNOSIS — J9621 Acute and chronic respiratory failure with hypoxia: Secondary | ICD-10-CM | POA: Diagnosis not present

## 2021-02-16 DIAGNOSIS — I152 Hypertension secondary to endocrine disorders: Secondary | ICD-10-CM | POA: Diagnosis not present

## 2021-02-16 DIAGNOSIS — I5032 Chronic diastolic (congestive) heart failure: Secondary | ICD-10-CM | POA: Diagnosis not present

## 2021-02-16 DIAGNOSIS — J9622 Acute and chronic respiratory failure with hypercapnia: Secondary | ICD-10-CM | POA: Diagnosis not present

## 2021-02-16 DIAGNOSIS — E1159 Type 2 diabetes mellitus with other circulatory complications: Secondary | ICD-10-CM | POA: Diagnosis not present

## 2021-02-17 ENCOUNTER — Ambulatory Visit
Admission: RE | Admit: 2021-02-17 | Discharge: 2021-02-17 | Disposition: A | Discharge status: Home / Self Care | Payer: Medicare Other | Source: Ambulatory Visit | Attending: Emergency Medicine | Admitting: Emergency Medicine

## 2021-02-17 ENCOUNTER — Other Ambulatory Visit: Payer: Self-pay

## 2021-02-17 ENCOUNTER — Telehealth: Payer: Self-pay | Admitting: Emergency Medicine

## 2021-02-17 DIAGNOSIS — J9811 Atelectasis: Secondary | ICD-10-CM

## 2021-02-17 NOTE — Telephone Encounter (Signed)
FYI patient was not able to perform CT scan   Thanks

## 2021-02-18 DIAGNOSIS — J9622 Acute and chronic respiratory failure with hypercapnia: Secondary | ICD-10-CM | POA: Diagnosis not present

## 2021-02-18 DIAGNOSIS — I5032 Chronic diastolic (congestive) heart failure: Secondary | ICD-10-CM | POA: Diagnosis not present

## 2021-02-18 DIAGNOSIS — I152 Hypertension secondary to endocrine disorders: Secondary | ICD-10-CM | POA: Diagnosis not present

## 2021-02-18 DIAGNOSIS — J441 Chronic obstructive pulmonary disease with (acute) exacerbation: Secondary | ICD-10-CM | POA: Diagnosis not present

## 2021-02-18 DIAGNOSIS — J9621 Acute and chronic respiratory failure with hypoxia: Secondary | ICD-10-CM | POA: Diagnosis not present

## 2021-02-18 DIAGNOSIS — E1159 Type 2 diabetes mellitus with other circulatory complications: Secondary | ICD-10-CM | POA: Diagnosis not present

## 2021-02-18 NOTE — Telephone Encounter (Signed)
OK thank you for letting me know.

## 2021-02-18 NOTE — Telephone Encounter (Signed)
Does he think we may be able to get it done if we give him some sedation or anxiety medication? If not he will need to have a PA/lateral CXR.   Thanks

## 2021-02-21 ENCOUNTER — Encounter: Payer: Self-pay | Admitting: Family Medicine

## 2021-02-21 ENCOUNTER — Ambulatory Visit (INDEPENDENT_AMBULATORY_CARE_PROVIDER_SITE_OTHER): Payer: Medicare Other | Admitting: Family Medicine

## 2021-02-21 ENCOUNTER — Ambulatory Visit: Payer: Medicare Other

## 2021-02-21 ENCOUNTER — Other Ambulatory Visit: Payer: Self-pay

## 2021-02-21 VITALS — BP 112/72 | HR 102 | Temp 97.9°F | Resp 16 | Ht 67.0 in | Wt 200.0 lb

## 2021-02-21 DIAGNOSIS — I5033 Acute on chronic diastolic (congestive) heart failure: Secondary | ICD-10-CM

## 2021-02-21 DIAGNOSIS — J431 Panlobular emphysema: Secondary | ICD-10-CM | POA: Diagnosis not present

## 2021-02-21 DIAGNOSIS — E118 Type 2 diabetes mellitus with unspecified complications: Secondary | ICD-10-CM

## 2021-02-21 DIAGNOSIS — R600 Localized edema: Secondary | ICD-10-CM

## 2021-02-21 LAB — COMPLETE METABOLIC PANEL WITH GFR
AG Ratio: 1.5 (calc) (ref 1.0–2.5)
ALT: 18 U/L (ref 9–46)
AST: 18 U/L (ref 10–35)
Albumin: 3.8 g/dL (ref 3.6–5.1)
Alkaline phosphatase (APISO): 94 U/L (ref 35–144)
BUN: 13 mg/dL (ref 7–25)
CO2: 35 mmol/L — ABNORMAL HIGH (ref 20–32)
Calcium: 8.8 mg/dL (ref 8.6–10.3)
Chloride: 91 mmol/L — ABNORMAL LOW (ref 98–110)
Creat: 0.73 mg/dL (ref 0.70–1.25)
GFR, Est African American: 115 mL/min/{1.73_m2} (ref >=60)
GFR, Est Non African American: 99 mL/min/{1.73_m2} (ref >=60)
Globulin: 2.6 g/dL (calc) (ref 1.9–3.7)
Glucose, Bld: 191 mg/dL — ABNORMAL HIGH (ref 65–99)
Potassium: 3.9 mmol/L (ref 3.5–5.3)
Sodium: 138 mmol/L (ref 135–146)
Total Bilirubin: 0.4 mg/dL (ref 0.2–1.2)
Total Protein: 6.4 g/dL (ref 6.1–8.1)

## 2021-02-21 MED ORDER — SERTRALINE HCL 100 MG PO TABS: 100.0000 mg | ORAL_TABLET | Freq: Every day | ORAL | 3 refills | Status: DC

## 2021-02-21 MED ORDER — CLONAZEPAM 0.5 MG PO TABS: 0.5000 mg | ORAL_TABLET | Freq: Three times a day (TID) | ORAL | 1 refills | Status: DC | PRN

## 2021-02-21 MED ORDER — DOXYCYCLINE HYCLATE 100 MG PO TABS: 100.0000 mg | ORAL_TABLET | Freq: Two times a day (BID) | ORAL | 0 refills | Status: DC
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
## 2021-02-21 NOTE — Telephone Encounter (Signed)
I have called Todd [name] and she is aware of RB recs.  CXR order has been placed and she is aware that they can come for this.

## 2021-02-21 NOTE — Telephone Encounter (Signed)
I think he should get a PA/lateral CXR now. I can review and then we can decide whether to get a CT going forward

## 2021-02-21 NOTE — Addendum Note (Signed)
Addended by: Elie Confer on: 02/21/2021 02:47 PM   Modules accepted: Orders
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
## 2021-02-23 ENCOUNTER — Telehealth: Payer: Self-pay | Admitting: *Deleted

## 2021-02-23 DIAGNOSIS — I5032 Chronic diastolic (congestive) heart failure: Secondary | ICD-10-CM | POA: Diagnosis not present

## 2021-02-23 DIAGNOSIS — J9622 Acute and chronic respiratory failure with hypercapnia: Secondary | ICD-10-CM | POA: Diagnosis not present

## 2021-02-23 DIAGNOSIS — E1159 Type 2 diabetes mellitus with other circulatory complications: Secondary | ICD-10-CM | POA: Diagnosis not present

## 2021-02-23 DIAGNOSIS — J9621 Acute and chronic respiratory failure with hypoxia: Secondary | ICD-10-CM | POA: Diagnosis not present

## 2021-02-23 DIAGNOSIS — I152 Hypertension secondary to endocrine disorders: Secondary | ICD-10-CM | POA: Diagnosis not present

## 2021-02-23 DIAGNOSIS — J441 Chronic obstructive pulmonary disease with (acute) exacerbation: Secondary | ICD-10-CM | POA: Diagnosis not present

## 2021-02-23 NOTE — Telephone Encounter (Signed)
ok 

## 2021-02-23 NOTE — Telephone Encounter (Signed)
Received call from [place], [facility] with [facility].   Requested order for Rollator to be sent to DME provider.   Ok to order?

## 2021-02-24 DIAGNOSIS — M6281 Muscle weakness (generalized): Secondary | ICD-10-CM | POA: Diagnosis not present

## 2021-02-24 DIAGNOSIS — J449 Chronic obstructive pulmonary disease, unspecified: Secondary | ICD-10-CM | POA: Diagnosis not present

## 2021-02-24 DIAGNOSIS — Z9181 History of falling: Secondary | ICD-10-CM | POA: Diagnosis not present

## 2021-02-24 DIAGNOSIS — R2689 Other abnormalities of gait and mobility: Secondary | ICD-10-CM | POA: Diagnosis not present

## 2021-02-24 DIAGNOSIS — R262 Difficulty in walking, not elsewhere classified: Secondary | ICD-10-CM | POA: Diagnosis not present

## 2021-02-24 NOTE — Telephone Encounter (Signed)
Orders placed via [place].

## 2021-02-25 DIAGNOSIS — I152 Hypertension secondary to endocrine disorders: Secondary | ICD-10-CM | POA: Diagnosis not present

## 2021-02-25 DIAGNOSIS — J9621 Acute and chronic respiratory failure with hypoxia: Secondary | ICD-10-CM | POA: Diagnosis not present

## 2021-02-25 DIAGNOSIS — E1159 Type 2 diabetes mellitus with other circulatory complications: Secondary | ICD-10-CM | POA: Diagnosis not present

## 2021-02-25 DIAGNOSIS — J9622 Acute and chronic respiratory failure with hypercapnia: Secondary | ICD-10-CM | POA: Diagnosis not present

## 2021-02-25 DIAGNOSIS — J441 Chronic obstructive pulmonary disease with (acute) exacerbation: Secondary | ICD-10-CM | POA: Diagnosis not present

## 2021-02-25 DIAGNOSIS — I5032 Chronic diastolic (congestive) heart failure: Secondary | ICD-10-CM | POA: Diagnosis not present

## 2021-02-28 ENCOUNTER — Telehealth: Payer: Self-pay | Admitting: *Deleted

## 2021-02-28 NOTE — Telephone Encounter (Signed)
Received call from patient daughter, Caitlyn.   Reports that patient has edema to B forearms and hands. State that patient is taking Lasix 40mg  PO BID.   Appointment scheduled for evaluation per last chart note.

## 2021-03-01 ENCOUNTER — Ambulatory Visit (INDEPENDENT_AMBULATORY_CARE_PROVIDER_SITE_OTHER): Payer: Medicare Other | Admitting: Family Medicine

## 2021-03-01 ENCOUNTER — Telehealth: Payer: Self-pay | Admitting: *Deleted

## 2021-03-01 ENCOUNTER — Encounter: Payer: Self-pay | Admitting: Family Medicine

## 2021-03-01 ENCOUNTER — Other Ambulatory Visit: Payer: Self-pay

## 2021-03-01 VITALS — BP 128/78 | HR 90 | Temp 98.4°F | Resp 18 | Ht 67.0 in | Wt 198.0 lb

## 2021-03-01 DIAGNOSIS — J431 Panlobular emphysema: Secondary | ICD-10-CM

## 2021-03-01 DIAGNOSIS — R600 Localized edema: Secondary | ICD-10-CM

## 2021-03-01 DIAGNOSIS — I5033 Acute on chronic diastolic (congestive) heart failure: Secondary | ICD-10-CM

## 2021-03-01 MED ORDER — FUROSEMIDE 80 MG PO TABS: 80.0000 mg | ORAL_TABLET | Freq: Two times a day (BID) | ORAL | 3 refills | Status: AC

## 2021-03-01 MED ORDER — METFORMIN HCL 500 MG PO TABS: 2.0000 | ORAL_TABLET | Freq: Two times a day (BID) | ORAL | 1 refills | Status: DC
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
## 2021-03-01 NOTE — Telephone Encounter (Signed)
Received call from [place], [place] with [facility].   Requested VO to extend [facility] PT services 2x weekly x3 weeks for endurance, gait, balance, strengthening, home exercise program and home safety.   VO given.

## 2021-03-02 DIAGNOSIS — J9621 Acute and chronic respiratory failure with hypoxia: Secondary | ICD-10-CM | POA: Diagnosis not present

## 2021-03-02 DIAGNOSIS — J9622 Acute and chronic respiratory failure with hypercapnia: Secondary | ICD-10-CM | POA: Diagnosis not present

## 2021-03-02 DIAGNOSIS — J441 Chronic obstructive pulmonary disease with (acute) exacerbation: Secondary | ICD-10-CM | POA: Diagnosis not present

## 2021-03-02 DIAGNOSIS — E1159 Type 2 diabetes mellitus with other circulatory complications: Secondary | ICD-10-CM | POA: Diagnosis not present

## 2021-03-02 DIAGNOSIS — I5032 Chronic diastolic (congestive) heart failure: Secondary | ICD-10-CM | POA: Diagnosis not present

## 2021-03-02 DIAGNOSIS — I152 Hypertension secondary to endocrine disorders: Secondary | ICD-10-CM | POA: Diagnosis not present

## 2021-03-03 ENCOUNTER — Ambulatory Visit: Payer: Self-pay | Admitting: Family Medicine

## 2021-03-04 ENCOUNTER — Telehealth: Payer: Self-pay | Admitting: Family Medicine

## 2021-03-04 DIAGNOSIS — J9622 Acute and chronic respiratory failure with hypercapnia: Secondary | ICD-10-CM | POA: Diagnosis not present

## 2021-03-04 DIAGNOSIS — I152 Hypertension secondary to endocrine disorders: Secondary | ICD-10-CM | POA: Diagnosis not present

## 2021-03-04 DIAGNOSIS — J441 Chronic obstructive pulmonary disease with (acute) exacerbation: Secondary | ICD-10-CM | POA: Diagnosis not present

## 2021-03-04 DIAGNOSIS — I5032 Chronic diastolic (congestive) heart failure: Secondary | ICD-10-CM | POA: Diagnosis not present

## 2021-03-04 DIAGNOSIS — E1159 Type 2 diabetes mellitus with other circulatory complications: Secondary | ICD-10-CM | POA: Diagnosis not present

## 2021-03-04 DIAGNOSIS — J9621 Acute and chronic respiratory failure with hypoxia: Secondary | ICD-10-CM | POA: Diagnosis not present

## 2021-03-04 NOTE — Telephone Encounter (Signed)
Received FMLA forms from patient daughter Todd [name].  Call placed to [place] [phone]~ telephone for more information.   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 She has been providing care for patient with ADL's.   Requested Beginning Date: 02/06/2021- 03/08/2021. Return to Work Date: 03/09/2021  Verbalized that fee may be charged and is per provider prerogative.   Forms routed to provider.

## 2021-03-04 NOTE — Telephone Encounter (Signed)
Todd [name]  03/04/21 11:25 AM Received call from [place] with number to fax to: [phone]. Reminder: patient wants paperwork faxed and wants to pick up originals.

## 2021-03-04 NOTE — Telephone Encounter (Signed)
Received call from [facility] with number to fax to: [phone]. Reminder: patient wants paperwork faxed and wants to pick up originals.

## 2021-03-04 NOTE — Telephone Encounter (Signed)
Meghan, daughter of patient, dropped off FMLA paperwork. Received $20 fee. Please call patient when paperwork completed and she will pick it up. Meghan also requested we fax paperwork to her employer asap; She will call back with the fax number. Paperwork placed in Christina's folder beside her desk.

## 2021-03-06 DIAGNOSIS — J449 Chronic obstructive pulmonary disease, unspecified: Secondary | ICD-10-CM | POA: Diagnosis not present

## 2021-03-06 DIAGNOSIS — J9610 Chronic respiratory failure, unspecified whether with hypoxia or hypercapnia: Secondary | ICD-10-CM | POA: Diagnosis not present

## 2021-03-08 ENCOUNTER — Telehealth: Payer: Self-pay | Admitting: Family Medicine

## 2021-03-08 ENCOUNTER — Encounter: Payer: Self-pay | Admitting: Family Medicine

## 2021-03-08 ENCOUNTER — Ambulatory Visit (INDEPENDENT_AMBULATORY_CARE_PROVIDER_SITE_OTHER): Payer: Medicare Other | Admitting: Family Medicine

## 2021-03-08 ENCOUNTER — Other Ambulatory Visit: Payer: Self-pay

## 2021-03-08 VITALS — BP 128/66 | HR 98 | Temp 98.3°F | Resp 14 | Ht 67.0 in | Wt 196.8 lb

## 2021-03-08 DIAGNOSIS — I152 Hypertension secondary to endocrine disorders: Secondary | ICD-10-CM | POA: Diagnosis not present

## 2021-03-08 DIAGNOSIS — J439 Emphysema, unspecified: Secondary | ICD-10-CM | POA: Diagnosis not present

## 2021-03-08 DIAGNOSIS — R600 Localized edema: Secondary | ICD-10-CM

## 2021-03-08 DIAGNOSIS — I5033 Acute on chronic diastolic (congestive) heart failure: Secondary | ICD-10-CM

## 2021-03-08 DIAGNOSIS — J9622 Acute and chronic respiratory failure with hypercapnia: Secondary | ICD-10-CM | POA: Diagnosis not present

## 2021-03-08 DIAGNOSIS — J441 Chronic obstructive pulmonary disease with (acute) exacerbation: Secondary | ICD-10-CM | POA: Diagnosis not present

## 2021-03-08 DIAGNOSIS — J9621 Acute and chronic respiratory failure with hypoxia: Secondary | ICD-10-CM | POA: Diagnosis not present

## 2021-03-08 DIAGNOSIS — E1159 Type 2 diabetes mellitus with other circulatory complications: Secondary | ICD-10-CM | POA: Diagnosis not present

## 2021-03-08 MED ORDER — METOLAZONE 5 MG PO TABS: 5.0000 mg | ORAL_TABLET | Freq: Once | ORAL | 0 refills | Status: DC

## 2021-03-08 NOTE — Telephone Encounter (Signed)
Call placed to patient wife. Patient daughter also noted on phone.   Advised of MD recommendations, but family was not happy about Dx.   Advised to schedule an appointment to discuss with PCP.

## 2021-03-08 NOTE — Telephone Encounter (Signed)
Patient's daughter Trinda Pascal wants to make an appointment to discuss recent notes about patient having dementia. Also wants to know difference between dementia and alzheimers, and discuss brain scan. Please advise at [phone].

## 2021-03-08 NOTE — Telephone Encounter (Signed)
Patient in office and completed FMLA given to family.   Fellow nurse received call from daughter and wife. States that question 6 was answered as follows: Briefly describe other appropriate medical facts related to condition for which employee seeks FMLA leave. - Patient has severe COPD, heart failure and dementia. Requires oxygen and BiPAP. Needs help with ADL's, toileting, bathing, feeding, cleaning around house.   Patient family concerned as they have not had any discussion about dementia Dx. Of note, patient was seen for hypercapnic episode S/P hospitalization in March 2022, and patient scored 22/30 on mini-mental exam. At follow up examination, hypercapnia was improved, but mini-mental score remained unchanged.   Inquired as to if this is correct Dx, should patient be referred to Neurology for work up/Tx.   Please advise.

## 2021-03-08 NOTE — Telephone Encounter (Signed)
He had memory loss at the visit which I discussed with the daughter who was present.  Dementia means memory loss. I have not diagnosed him with Alzheimers and I recommend that we monitor this for now.

## 2021-03-08 NOTE — Telephone Encounter (Signed)
Exception made due to nature of patient's acute health concerns and issues.

## 2021-03-08 NOTE — Progress Notes (Signed)
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 update.  I am hoping that this will spark some diuresis and then we can get off an additional 5 pounds of fluid.  If so then I will reduce his dose of Lasix hopefully back to 40 to 80 mg a day and try to maintain his leg swelling using compression hose.  Monitor creatinine and potassium today with a BMP

## 2021-03-08 NOTE — Telephone Encounter (Signed)
Call placed to [facility]. Reports that she would like to schedule telehealth visit to discuss with provider.   Appointment scheduled.

## 2021-03-09 LAB — BASIC METABOLIC PANEL WITH GFR
BUN: 12 mg/dL (ref 7–25)
CO2: 32 mmol/L (ref 20–32)
Calcium: 8.8 mg/dL (ref 8.6–10.3)
Chloride: 91 mmol/L — ABNORMAL LOW (ref 98–110)
Creat: 0.79 mg/dL (ref 0.70–1.25)
GFR, Est African American: 112 mL/min/{1.73_m2} (ref >=60)
GFR, Est Non African American: 96 mL/min/{1.73_m2} (ref >=60)
Glucose, Bld: 135 mg/dL — ABNORMAL HIGH (ref 65–99)
Potassium: 3.4 mmol/L — ABNORMAL LOW (ref 3.5–5.3)
Sodium: 138 mmol/L (ref 135–146)

## 2021-03-09 NOTE — Telephone Encounter (Signed)
Called patient 5/3 to inform we received a confirmation of the fax that was sent, and to tell her she can pick up her copy at the front desk.

## 2021-03-10 ENCOUNTER — Ambulatory Visit: Payer: Medicare Other | Admitting: Family Medicine

## 2021-03-10 DIAGNOSIS — J9622 Acute and chronic respiratory failure with hypercapnia: Secondary | ICD-10-CM | POA: Diagnosis not present

## 2021-03-10 DIAGNOSIS — I152 Hypertension secondary to endocrine disorders: Secondary | ICD-10-CM | POA: Diagnosis not present

## 2021-03-10 DIAGNOSIS — J9621 Acute and chronic respiratory failure with hypoxia: Secondary | ICD-10-CM | POA: Diagnosis not present

## 2021-03-10 DIAGNOSIS — J441 Chronic obstructive pulmonary disease with (acute) exacerbation: Secondary | ICD-10-CM | POA: Diagnosis not present

## 2021-03-10 DIAGNOSIS — E1159 Type 2 diabetes mellitus with other circulatory complications: Secondary | ICD-10-CM | POA: Diagnosis not present

## 2021-03-10 DIAGNOSIS — I5033 Acute on chronic diastolic (congestive) heart failure: Secondary | ICD-10-CM | POA: Diagnosis not present

## 2021-03-14 ENCOUNTER — Telehealth (INDEPENDENT_AMBULATORY_CARE_PROVIDER_SITE_OTHER): Payer: Medicare Other | Admitting: Family Medicine

## 2021-03-14 ENCOUNTER — Other Ambulatory Visit: Payer: Self-pay

## 2021-03-14 DIAGNOSIS — G3184 Mild cognitive impairment, so stated: Secondary | ICD-10-CM | POA: Diagnosis not present

## 2021-03-14 DIAGNOSIS — J449 Chronic obstructive pulmonary disease, unspecified: Secondary | ICD-10-CM | POA: Diagnosis not present

## 2021-03-14 DIAGNOSIS — F411 Generalized anxiety disorder: Secondary | ICD-10-CM
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
## 2021-03-15 ENCOUNTER — Telehealth: Payer: Self-pay | Admitting: Pharmacist

## 2021-03-15 DIAGNOSIS — J9622 Acute and chronic respiratory failure with hypercapnia: Secondary | ICD-10-CM | POA: Diagnosis not present

## 2021-03-15 DIAGNOSIS — E1159 Type 2 diabetes mellitus with other circulatory complications: Secondary | ICD-10-CM | POA: Diagnosis not present

## 2021-03-15 DIAGNOSIS — J441 Chronic obstructive pulmonary disease with (acute) exacerbation: Secondary | ICD-10-CM | POA: Diagnosis not present

## 2021-03-15 DIAGNOSIS — J9621 Acute and chronic respiratory failure with hypoxia: Secondary | ICD-10-CM | POA: Diagnosis not present

## 2021-03-15 DIAGNOSIS — I5033 Acute on chronic diastolic (congestive) heart failure: Secondary | ICD-10-CM | POA: Diagnosis not present

## 2021-03-15 DIAGNOSIS — I152 Hypertension secondary to endocrine disorders: Secondary | ICD-10-CM | POA: Diagnosis not present

## 2021-03-15 NOTE — Progress Notes (Signed)
Chronic Care Management Pharmacy Note  03/18/2021 Name:  Todd [name] MRN:  [id number] DOB:  [birth year]  Subjective: Todd [name] is an 60 y.o. year old male who is a primary patient of Pickard, Cammie Mcgee, MD.  The CCM team was consulted for assistance with disease management and care coordination needs.    Engaged with patient by telephone for initial visit in response to provider referral for pharmacy case management and/or care coordination services.   Consent to Services:  The patient was given the following information about Chronic Care Management services today, agreed to services, and gave verbal consent: 1. CCM service includes personalized support from designated clinical staff supervised by the primary care provider, including individualized plan of care and coordination with other care providers 2. 24/7 contact phone numbers for assistance for urgent and routine care needs. 3. Service will only be billed when office clinical staff spend 20 minutes or more in a month to coordinate care. 4. Only one practitioner may furnish and bill the service in a calendar month. 5.The patient may stop CCM services at any time (effective at the end of the month) by phone call to the office staff. 6. The patient will be responsible for cost sharing (co-pay) of up to 20% of the service fee (after annual deductible is met). Patient agreed to services and consent obtained.  Patient Care Team: [name], MD as PCP - General (Family Medicine) Constance Haw, MD as PCP - Electrophysiology (Cardiology) Martinique, Peter M, MD as PCP - Cardiology (Cardiology) Edythe Clarity, [facility] as Pharmacist (Pharmacist)  Recent office visits: 03/14/21 (Telemedicine) Dr. Dennard Schaumann For follow-up.No medication changes. Per note: The doctor is recommending for neuropsychological testing and psychiatric consultation. 03/08/21 Dr. Dennard Schaumann For Follow-Up. STARTED Metolazone 5 mg daily. (30 min before fluid pill) Per  note: I recommended that we try metolazone 5 mg p.o. x1,30 minutes before Lasix in the morning and then resume his previous dose of Lasix 80 mg twice a day. I recommended that we do this once and then have asked the daughter to call me back Friday with an update. 03/01/21 Dr. Dennard Schaumann For follow-Up. CHANGED Furosemide to 80 mg 2 times daily. Per note: Lasix to 80 mg twice a day and then rechecking lab work and fluid status in 1 week. 02/21/21 Dr. Dennard Schaumann For Follow-Up. STARTED Clonazepam 0.5 mg 3 times daily PRN, Doxycyline Hyclate 100 mg 2 times daily and Sertraline 100 mg daily. STOPPED Escitalopram and Ativan. 02/07/21 Dr. Dennard Schaumann For Follow-Up. STARTED Potassium Chloride Crys ER 20 MEQ. 02/04/21 Dr. Dennard Schaumann For Follow-Up. No medication changes.INCREASED Lexapro to 20 mg a day. 01/28/21 Dr. Dennard Schaumann For Follow-Up. STARTED Empagliflozin 25 mg daily before breakfast. INCREASED Lasix to 80 mg a day. Per note: the doctor recommended that they give him 1 to 2 tablets of Ativan at night to help him relax in order to keep him compliant with the BiPAP. 01/24/21 Dr. Norva Riffle For follow-Up. STARTED Lorazepam 0.5 mg at bedtime PRN  STOPPED Prednisone. 01/10/21 Dr. Dennard Schaumann (Telephone) Insulin changed to Humalog. Insulin Lispro 100 Unit/ML.Inject 2-15 Units into the skin in the morning, at noon, in the evening, and at bedtime following sliding scale. Max daily dose 60U. 01/07/21 Dr. Dennard Schaumann For follow-Up. STARTED Furosemide 40 mg daily, Insulin Aspart 10 units 4 times daily, and Prednisone 20 mg pack. 11/30.21 Dr. Dennard Schaumann For follow-Up. STARTED Hydrocodone-Acetamiophen 5-325 mg 1 tablet every 6 hours PRN. STOP Epinephrine. Per note: gave him samples of Trelegy and encouraged  him to use this daily.  09/20/20 Dr. Dennard Schaumann For Syncope. Per note The doctor advised that the patient should go to the ER. No medication changes.   Recent consult visits: 02/15/21 Cammnitz, Marylouise Stacks, MD. For sick sinus syndrome. Per note: INCREASE  Lasix to 80 mg twice daily for the next 4 days, then return to normal dosing. 02/07/21 Pulmonology Byrum, Rose Fillers, MD.For follow-Up. No medication changes.  11/17/20 Pulmonology Byrum, Rose Fillers, MD. For collapse of right lung. Per note: Continue using oxygen when the patient exert hisself. No medication changes  Hospital visits: 01/11/21 Aline August, MD. At [facility]. For Acute and chronic respiratory failure with hypercapnia.(6 days) STOPPED Carvedilol, Diazepam, Hydrocodone and nicotine. START Nebivolol 5 mg 1 tablet by mouth daily on 01/18/21.  12/08/20 [facility] CHANGED Diazepam to 5 mg Take 0.5 tablets by mouth every 8 hours as needed for anxiety. START losartan 50 MG tablet Take 1 tablet by mouth daily. STOP losartan-hydrochlorothiazide 50-12.5 MG per tablet 09/20/20 Eulogio Bear U, DO. At [facility] (3 Days) For Syncope. Per note: Pacemaker Implant. STOPPED Carvedilol, Celecoxib, and Losartan. START Nicotine patches. CHANGED Metformin to 500 mg 2 tablets two times daily. and Trelegy Ellipta to 100-62.5-25 MCG/INH Inhale 1 Inhaler into the lungs every morning.  Medication History: Atorvastatin 80 mg 90 DS 01/24/21 Empagliflozin 25 mg 30 DS 02/20/21 Metformin 500 mg 90 DS 03/01/21 Glipizide 5 mg 90 DS 03/09/21  Objective:  Lab Results  Component Value Date   CREATININE 0.79 03/08/2021   BUN 12 03/08/2021   GFR 110.04 06/24/2013   GFRNONAA 96 03/08/2021   GFRAA 112 03/08/2021   NA 138 03/08/2021   K 3.4 (L) 03/08/2021   CALCIUM 8.8 03/08/2021   CO2 32 03/08/2021   GLUCOSE 135 (H) 03/08/2021    Lab Results  Component Value Date/Time   HGBA1C 7.8 (H) 01/07/2021 04:59 PM   HGBA1C 7.9 (H) 09/21/2020 02:57 AM   GFR 110.04 06/24/2013 12:34 PM   MICROALBUR 1.2 12/01/2019 09:10 AM    Last diabetic Eye exam:  Lab Results  Component Value Date/Time   HMDIABEYEEXA No Retinopathy 07/04/2020 03:12 PM    Last diabetic Foot  exam: No results found for: HMDIABFOOTEX   Lab Results  Component Value Date   CHOL 139 01/14/2021   HDL 60 01/14/2021   LDLCALC 63 01/14/2021   TRIG 78 01/14/2021   CHOLHDL 2.3 01/14/2021    Hepatic Function Latest Ref Rng & Units 02/21/2021 01/24/2021 01/12/2021  Total Protein 6.1 - 8.1 g/dL 6.4 5.7(L) 6.2(L)  Albumin 3.5 - 5.0 g/dL - - 3.3(L)  AST 10 - 35 U/L 18 17 21   ALT 9 - 46 U/L 18 31 30   Alk Phosphatase 38 - 126 U/L - - 63  Total Bilirubin 0.2 - 1.2 mg/dL 0.4 0.3 0.4  Bilirubin, Direct 0.0 - 0.2 mg/dL - - <0.1    Lab Results  Component Value Date/Time   TSH 1.511 05/07/2018 05:13 AM   TSH 1.967 05/30/2013 02:06 AM   TSH 0.390 01/12/2012 02:00 AM    CBC Latest Ref Rng & Units 01/24/2021 01/17/2021 01/15/2021  WBC 3.8 - 10.8 Thousand/uL 9.2 8.6 12.5(H)  Hemoglobin 13.2 - 17.1 g/dL 10.6(L) 10.0(L) 10.4(L)  Hematocrit 38.5 - 50.0 % 33.5(L) 30.9(L) 32.4(L)  Platelets 140 - 400 Thousand/uL 220 173 221    No results found for: VD25OH  Clinical ASCVD: No  The ASCVD Risk score Mikey Bussing DC Brooke Bonito., et al.,  2013) failed to calculate for the following reasons:   The patient has a prior MI or stroke diagnosis    Depression screen [facility] 2/9 12/01/2019 02/13/2017  Decreased Interest 0 0  Down, Depressed, Hopeless 0 0  PHQ - 2 Score 0 0  Altered sleeping - 0  Tired, decreased energy - 0  Change in appetite - 0  Feeling bad or failure about yourself  - 0  Trouble concentrating - 0  Moving slowly or fidgety/restless - 0  Suicidal thoughts - 0  PHQ-9 Score - 0  Difficult doing work/chores - Not difficult at all      Social History   Tobacco Use  Smoking Status Former Smoker  . Packs/day: 1.50  . Years: 44.00  . Pack years: 66.00  . Types: Cigars  . Quit date: 11/17/2020  . Years since quitting: 0.3  Smokeless Tobacco Former Systems developer  . Quit date: 11/17/2020  Tobacco Comment   Stopped smoking 2-3 weeks ago. ARJ 11/17/20   BP Readings from Last 3 Encounters:  03/08/21 128/66   03/01/21 128/78  02/21/21 112/72   Pulse Readings from Last 3 Encounters:  03/08/21 98  03/01/21 90  02/21/21 (!) 102   Wt Readings from Last 3 Encounters:  03/08/21 196 lb 12.8 oz (89.3 kg)  03/01/21 198 lb (89.8 kg)  02/21/21 200 lb (90.7 kg)   BMI Readings from Last 3 Encounters:  03/08/21 30.82 kg/m  03/01/21 31.01 kg/m  02/21/21 31.32 kg/m    Assessment/Interventions: Review of patient past medical history, allergies, medications, health status, including review of consultants reports, laboratory and other test data, was performed as part of comprehensive evaluation and provision of chronic care management services.   SDOH:  (Social Determinants of Health) assessments and interventions performed: Yes  SDOH Screenings   Alcohol Screen: Not on file  Depression (VQQ5-9): Not on file  Financial Resource Strain: Low Risk   . Difficulty of Paying Living Expenses: Not very hard  Food Insecurity: Not on file  Housing: Not on file  Physical Activity: Not on file  Social Connections: Not on file  Stress: Not on file  Tobacco Use: Medium Risk  . Smoking Tobacco Use: Former Smoker  . Smokeless Tobacco Use: Former Soil scientist Needs: Not on file    [place]  Allergies  Allergen Reactions  . Dust Mite Extract Anaphylaxis  . Other Anaphylaxis, Swelling, Rash and Other (See Comments)    Grass and dog dander- sneeze and throat swells-- long-haired dogs  Pollen- Anaphylaxis  . Penicillins Hives  . Aspirin Nausea Only and Other (See Comments)    Patient can tolerate 81 mg ONLY- stated his neck swells at any dose >81 mg- "looks like I have the measles"  . Ibuprofen Nausea And Vomiting, Swelling and Other (See Comments)    Causes Severe headaches- "looks like I have the measles"  . Latex Rash  . Tape Rash    Medications Reviewed Today    Reviewed by Six, Eden Lathe, LPN (Licensed Practical Nurse) on 03/08/21 at [place]  Med List Status: <None>  Medication  Order Taking? Sig Documenting Provider Last Dose Status Informant  albuterol (VENTOLIN HFA) 108 (90 Base) MCG/ACT inhaler [id number]  Inhale 2 puffs into the lungs every 6 (six) hours as needed for wheezing or shortness of breath. [name], MD  Active Child  aspirin EC 81 MG tablet [id number]  Take 81 mg by mouth daily. [provider]  Active Child  Med Note Jannifer Franklin, SANDY B   Mon Dec 01, 2019  8:18 AM)    atorvastatin (LIPITOR) 80 MG tablet [id number]  TAKE 1 TABLET BY MOUTH DAILY IN THE EVENING AT 6:00PM  Patient taking differently: Take 80 mg by mouth every evening. TAKE 1 TABLET BY MOUTH DAILY IN THE EVENING AT 6:00PM   [name], MD  Active   blood glucose meter kit and supplies [id number]  1 each by Other route as directed. Dispense based on patient and insurance preference. Use up to four times daily as directed. (FOR ICD-10 E10.9, E11.9). Redmond Baseman, Crystal A, FNP  Active Child  calcium carbonate (TUMS - DOSED IN MG ELEMENTAL CALCIUM) 500 MG chewable tablet [id number]  Chew 2 tablets by mouth as needed for indigestion or heartburn.  [provider]  Active Child  clonazePAM (KLONOPIN) 0.5 MG tablet [id number]  Take 1 tablet (0.5 mg total) by mouth 3 (three) times daily as needed for anxiety (stop ativan). [name], MD  Active   empagliflozin (JARDIANCE) 25 MG TABS tablet [id number]  Take 1 tablet (25 mg total) by mouth daily before breakfast. [name], MD  Active   famotidine (PEPCID) 20 MG tablet [id number]  TAKE 1 TABLET BY MOUTH DAILY AS NEEDED FOR HEARTBURN OR INDIGESTION  Patient taking differently: Take 20 mg by mouth daily as needed for indigestion or heartburn.   [name], MD  Active   Fluticasone-Umeclidin-Vilant (TRELEGY ELLIPTA) 100-62.5-25 MCG/INH AEPB [id number]  Inhale 1 Inhaler into the lungs every morning.  Patient taking differently: Inhale 1 puff into the lungs in the morning.   [name], MD  Active    furosemide (LASIX) 80 MG tablet [id number]  Take 1 tablet (80 mg total) by mouth 2 (two) times daily. [name], MD  Active   glipiZIDE (GLUCOTROL) 5 MG tablet [id number]  TAKE 1 TABLET BY MOUTH IN THE MORNING BEFORE BREAKFAST  Patient taking differently: Take 5 mg by mouth daily before breakfast.   [name], MD  Active   insulin lispro (HUMALOG KWIKPEN) 100 UNIT/ML KwikPen [id number]  Inject 2-15 Units into the skin in the morning, at noon, in the evening, and at bedtime following sliding scale. Max daily dose 60U, Dx: E11.9 [name], MD  Active   LORazepam (ATIVAN) 0.5 MG tablet [id number]  TAKE 1-2 TABLET(0.5 Mg - 1.0 Mg) BY MOUTH AT BEDTIME AS NEEDED FOR ANXIETY OR SLEEP Noemi Chapel A, NP  Active   losartan (COZAAR) 50 MG tablet [id number]  Take 50 mg by mouth daily. [provider]  Active Child  metFORMIN (GLUCOPHAGE) 500 MG tablet [id number]  Take 2 tablets (1,000 mg total) by mouth 2 (two) times daily. [name], MD  Active   nebivolol (BYSTOLIC) 5 MG tablet [id number]  Take 1 tablet (5 mg total) by mouth daily. [name], MD  Active   nitroGLYCERIN (NITROSTAT) 0.4 MG SL tablet [id number]  Place 1 tablet (0.4 mg total) under the tongue every 5 (five) minutes as needed for chest pain. Martinique, Peter M, MD  Active Child  pantoprazole (PROTONIX) 40 MG tablet [id number]  Take 1 tablet (40 mg total) by mouth at bedtime. Geradine Girt, DO  Active Child  potassium chloride SA (KLOR-CON) 20 MEQ tablet [id number]  Take 1 tablet (20 mEq total) by mouth daily. [name], MD  Active   sertraline (ZOLOFT) 100 MG tablet [id number]  Take 1 tablet (100 mg total) by mouth  daily. Stop escitalopram [name], MD  Active   Med List Note Berneice Gandy 05/06/18 1755): Paula--[phone]---pt gave verbal consent on 05/06/18 at 5:54pm to speak with wife, [name] concerning his medications.          Patient Active Problem List   Diagnosis Date  Noted  . Bilateral leg edema   . Acute on chronic heart failure ([place])   . Acute and chronic respiratory failure with hypercapnia ([place]) 01/12/2021  . Anxiety 01/12/2021  . Chronic diastolic CHF (congestive heart failure) ([place]) 01/12/2021  . Gastroesophageal reflux disease   . Sinus arrest 09/22/2020  . Syncope 09/20/2020  . Hyperlipidemia associated with type 2 diabetes mellitus ([place]) 09/20/2020  . COPD (chronic obstructive pulmonary disease) ([place]) 09/06/2020  . Collapse of right lung 08/06/2020  . Benign essential HTN 05/03/2020  . Gastroesophageal reflux disease with esophagitis   . Hematemesis with nausea 05/06/2018  . H/O total knee replacement, right 05/01/2018  . Insulin-requiring or dependent type II diabetes mellitus ([place])   . Acute tear medial meniscus 06/09/2014  . CAD (coronary artery disease)   . NSTEMI (non-ST elevated myocardial infarction) ([place]) 05/30/2013  . Diabetes mellitus ([place]) 05/30/2013  . Hyperlipidemia   . Tobacco abuse   . Hypertension associated with diabetes ([place]) 01/11/2012  . Moonshine poisoning ([place]) 01/11/2012  . Nausea and vomiting 01/11/2012  . Hypokalemia 01/11/2012    Immunization History  Administered Date(s) Administered  . Influenza,inj,Quad PF,6+ Mos 07/27/2020  . PFIZER(Purple Top)SARS-COV-2 Vaccination 01/29/2020, 02/25/2020  . Pneumococcal Polysaccharide-23 06/06/2013, 07/27/2020    Conditions to be addressed/monitored:  HTN, CAD, CHF, COPD, DM, HLD   Care Plan : General Pharmacy (Adult)  Updates made by Edythe Clarity, RPH since 03/18/2021 12:00 AM    Problem: HTN, CAD, CHF, COPD, DM, HLD   Priority: High  Onset Date: 03/18/2021    Long-Range Goal: Patient-Specific Goal   Start Date: 03/18/2021  Expected End Date: 09/18/2021  This Visit's Progress: On track  Priority: High  Note:    Current Barriers:  . Unable to independently monitor therapeutic efficacy  Pharmacist Clinical Goal(s):  Marland Kitchen Patient will achieve adherence  to monitoring guidelines and medication adherence to achieve therapeutic efficacy . adhere to prescribed medication regimen as evidenced by fill dates . contact provider office for questions/concerns as evidenced notation of same in electronic health record through collaboration with PharmD and provider.   Interventions: . 1:1 collaboration with [name], MD regarding development and update of comprehensive plan of care as evidenced by provider attestation and co-signature . Inter-disciplinary care team collaboration (see longitudinal plan of care) . Comprehensive medication review performed; medication list updated in electronic medical record  Hypertension (BP goal <130/80) -Controlled -Current treatment: . Losartan 40m daily . Nebivolol 533mdaily -Medications previously tried: atenolol, carvedilol, Maxzide  -Current home readings: none specific, daughter is a nuMarine scientistnd will check occasionally, always normal  -Current exercise habits: minimal -Denies hypotensive/hypertensive symptoms -Educated on BP goals and benefits of medications for prevention of heart attack, stroke and kidney damage; Importance of home blood pressure monitoring; Proper BP monitoring technique; Symptoms of hypotension and importance of maintaining adequate hydration; -Counseled to monitor BP at home weekly or when symptomatic, document, and provide log at future appointments -Recommended to continue current medication  Hyperlipidemia: (LDL goal < 70) -Controlled -Current treatment: . Atorvastatin 8064maily -Medications previously tried: none noted  -Educated on Cholesterol goals;  Benefits of statin for ASCVD risk reduction; Importance of limiting foods high  in cholesterol;  -Reviewed most recent lipid panel, excellent results! -Recommended to continue current medication  Diabetes (A1c goal <8%) -Controlled -Current medications: . Jardiance 653m daily . Metformin 5017mtwo tablets po  BID . Glipizide 53m72maily -Medications previously tried: Humalog, Novolog, Metformin  -Current home glucose readings . fasting glucose: not checking regularly, denies hypoglycemia . post prandial glucose: not checking regularly -Denies hypoglycemic/hyperglycemic symptoms -Educated on A1c and blood sugar goals; Complications of diabetes including kidney damage, retinal damage, and cardiovascular disease; Prevention and management of hypoglycemic episodes; Benefits of routine self-monitoring of blood sugar; Rapid effect of glipizide lowering blood glucose -Counseled to check feet daily and get yearly eye exams -Recommended to continue current medication Recommended eating regular meals throughout the day to minimize hypoglycemia risk, recommended monitoring glucose when symptomatic  Heart Failure (Goal: manage symptoms and prevent exacerbations) -Controlled -Last ejection fraction: 60-65% -HF type: Diastolic -Current treatment: . Furosemide 17m67mD . Metolazone 53mg 13mystolic 53mg d33my -Medications previously tried: carvedilol  -Current home BP/HR readings: '"normal"  -Educated on Benefits of medications for managing symptoms and prolonging life Importance of weighing daily; if you gain more than 3 pounds in one day or 5 pounds in one week, contact providers -Recommended to continue current medication  COPD (Goal: control symptoms and prevent exacerbations) -Controlled -Current treatment  . Trelegy Ellipta 100-62.5-253mcg/inh . Albuterol HFA 90 mcg prn -Medications previously tried: none noted   -Exacerbations requiring treatment in last 6 months: none -Patient reports consistent use of maintenance inhaler -Frequency of rescue inhaler use: 3-4 times per day on strenuous activity days (therapy), 2 to 3 times per day on normal days -Counseled on Proper inhaler technique; Benefits of consistent maintenance inhaler use When to use rescue inhaler Differences between maintenance  and rescue inhalers  -Reminded to rinse mouth after Trelegy use -Recommended to continue current medication Assessed patient finances. Patient currently gets extra help through Medicare.  This makes all of his medications affordable.    Patient Goals/Self-Care Activities . Patient will:  - take medications as prescribed check glucose when symptomatic, document, and provide at future appointments check blood pressure weekly, document, and provide at future appointments weigh daily, and contact provider if weight gain of 3 lbs in a day or 5 lbs in one week  Follow Up Plan: The care management team will reach out to the patient again over the next 120 days.        Medication Assistance: None required.  Patient affirms current coverage meets needs.  [id number] [id number]: [phone] pill box? Yes Pt endorses 100% compliance  We discussed: Benefits of medication synchronization, packaging and delivery as well as enhanced pharmacist oversight with [employer]. Patient decided to: Continue current medication management strategy  Care Plan and Follow Up Patient Decision:  Patient agrees to Care Plan and Follow-up.  Plan: The care management team will reach out to the patient again over the next 120 days.  ChristBeverly MilchmD Clinical Pharmacist [place] [phone]

## 2021-03-15 NOTE — Progress Notes (Addendum)
Chronic Care Management Pharmacy Assistant   Name: Todd [name]  MRN: [id number] DOB: 07-Jul-1961  Todd [name] is an 60 y.o. year old male who presents for his initial CCM visit with the clinical pharmacist.  Reason for Encounter: Chart Prep   Conditions to be addressed/monitored: HTN,CHF,COPD,Anxiety.   Primary concerns for visit include: Memory loss and Consistent medication list.   Recent office visits:  03/14/21 (Telemedicine) Dr. Dennard Schaumann For follow-up.No medication changes. Per note: The doctor is recommending for neuropsychological testing and  psychiatric consultation. 03/08/21 Dr. Dennard Schaumann For Follow-Up. STARTED Metolazone 5 mg daily. (30 min before fluid pill) Per note: I recommended that we try metolazone 5 mg p.o. x1, 30 minutes before Lasix in the morning and then resume his previous dose of Lasix 80 mg twice a day.  I recommended that we do this once and then have asked the daughter to call me back Friday with an update. 03/01/21 Dr. Dennard Schaumann For follow-Up. CHANGED Furosemide to 80 mg 2 times daily. Per note: Lasix to 80 mg twice a day and then rechecking lab work and fluid status in 1 week. 02/21/21 Dr. Dennard Schaumann For Follow-Up. STARTED Clonazepam 0.5 mg 3 times daily PRN, Doxycyline Hyclate 100 mg 2 times daily and Sertraline 100 mg daily. STOPPED Escitalopram and Ativan. 02/07/21 Dr. Dennard Schaumann For Follow-Up. STARTED Potassium Chloride Crys ER 20 MEQ. 02/04/21 Dr. Dennard Schaumann For Follow-Up. No medication changes.INCREASED Lexapro to 20 mg a day. 01/28/21 Dr. Dennard Schaumann For Follow-Up. STARTED Empagliflozin 25 mg daily before breakfast. INCREASED Lasix to 80 mg a day. Per note: the doctor recommended that they give him 1 to 2 tablets of Ativan at night to help him relax in order to keep him compliant with the BiPAP. 01/24/21 Dr. Norva Riffle For follow-Up. STARTED Lorazepam 0.5 mg at bedtime PRN  STOPPED Prednisone. 01/10/21 Dr. Dennard Schaumann (Telephone) Insulin changed to Humalog. Insulin Lispro 100  Unit/ML.Inject 2-15 Units into the skin in the morning, at noon, in the evening, and at bedtime following sliding scale. Max daily dose 60U. 01/07/21 Dr. Dennard Schaumann For follow-Up. STARTED Furosemide 40 mg daily, Insulin Aspart 10 units 4 times daily, and Prednisone 20 mg pack. 11/30.21 Dr. Dennard Schaumann For follow-Up. STARTED Hydrocodone-Acetamiophen 5-325 mg 1 tablet every 6 hours PRN. STOP Epinephrine. Per note: gave him samples of Trelegy and encouraged him to use this daily.   09/20/20 Dr. Dennard Schaumann For Syncope. Per note The doctor advised that the patient should go to the ER. No medication changes.   Recent consult visits:  02/15/21 Cammnitz, Marylouise Stacks, MD. For sick sinus syndrome. Per note: INCREASE Lasix to 80 mg twice daily for the next 4 days, then return to normal dosing. 02/07/21 Pulmonology Byrum, Rose Fillers, MD.For follow-Up. No medication changes.  11/17/20 Pulmonology Byrum, Rose Fillers, MD. For collapse of right lung. Per note: Continue using oxygen when the patient exert hisself. No medication changes.  Hospital visits:  01/11/21 Aline August, MD. At [facility]. For Acute and chronic respiratory failure with hypercapnia.(6 days) STOPPED Carvedilol, Diazepam, Hydrocodone and nicotine. START Nebivolol 5 mg 1 tablet by mouth daily on 01/18/21.  12/08/20 [facility] CHANGED Diazepam to 5 mg Take 0.5 tablets by mouth every 8 hours as needed for anxiety. START losartan 50 MG tablet Take 1 tablet by mouth daily. STOP losartan-hydrochlorothiazide 50-12.5 MG per tablet 09/20/20 Eulogio Bear U, DO. At [facility] (3 Days) For Syncope. Per note: Pacemaker Implant. STOPPED Carvedilol, Celecoxib, and Losartan. START Nicotine  patches. CHANGED Metformin to 500 mg 2 tablets two times daily. and Trelegy Ellipta to 100-62.5-25 MCG/INH Inhale 1 Inhaler into the lungs every morning.  Medication History: Atorvastatin 80 mg 90 DS 01/24/21 Empagliflozin 25 mg 30 DS  02/20/21 Metformin 500 mg 90 DS 03/01/21 Glipizide 5 mg 90 DS 03/09/21   Medications: Outpatient Encounter Medications as of 03/15/2021  Medication Sig   albuterol (VENTOLIN HFA) 108 (90 Base) MCG/ACT inhaler Inhale 2 puffs into the lungs every 6 (six) hours as needed for wheezing or shortness of breath.   aspirin EC 81 MG tablet Take 81 mg by mouth daily.   atorvastatin (LIPITOR) 80 MG tablet TAKE 1 TABLET BY MOUTH DAILY IN THE EVENING AT 6:00PM (Patient taking differently: Take 80 mg by mouth every evening. TAKE 1 TABLET BY MOUTH DAILY BID)   blood glucose meter kit and supplies 1 each by Other route as directed. Dispense based on patient and insurance preference. Use up to four times daily as directed. (FOR ICD-10 E10.9, E11.9).   calcium carbonate (TUMS - DOSED IN MG ELEMENTAL CALCIUM) 500 MG chewable tablet Chew 2 tablets by mouth as needed for indigestion or heartburn.    clonazePAM (KLONOPIN) 0.5 MG tablet Take 1 tablet (0.5 mg total) by mouth 3 (three) times daily as needed for anxiety (stop ativan).   empagliflozin (JARDIANCE) 25 MG TABS tablet Take 1 tablet (25 mg total) by mouth daily before breakfast.   famotidine (PEPCID) 20 MG tablet TAKE 1 TABLET BY MOUTH DAILY AS NEEDED FOR HEARTBURN OR INDIGESTION (Patient taking differently: Take 20 mg by mouth daily as needed for indigestion or heartburn.)   Fluticasone-Umeclidin-Vilant (TRELEGY ELLIPTA) 100-62.5-25 MCG/INH AEPB Inhale 1 Inhaler into the lungs every morning. (Patient taking differently: Inhale 1 puff into the lungs in the morning.)   furosemide (LASIX) 80 MG tablet Take 1 tablet (80 mg total) by mouth 2 (two) times daily.   glipiZIDE (GLUCOTROL) 5 MG tablet TAKE 1 TABLET BY MOUTH IN THE MORNING BEFORE BREAKFAST (Patient taking differently: Take 5 mg by mouth daily before breakfast.)   insulin lispro (HUMALOG KWIKPEN) 100 UNIT/ML KwikPen Inject 2-15 Units into the skin in the morning, at noon, in the evening, and at bedtime following  sliding scale. Max daily dose 60U, Dx: E11.9   LORazepam (ATIVAN) 0.5 MG tablet TAKE 1-2 TABLET(0.5 Mg - 1.0 Mg) BY MOUTH AT BEDTIME AS NEEDED FOR ANXIETY OR SLEEP   losartan (COZAAR) 50 MG tablet Take 50 mg by mouth daily.   metFORMIN (GLUCOPHAGE) 500 MG tablet Take 2 tablets (1,000 mg total) by mouth 2 (two) times daily.   metolazone (ZAROXOLYN) 5 MG tablet Take 1 tablet (5 mg total) by mouth once for 1 dose. 30 minutes before fluid pill in the morning (one time)   nebivolol (BYSTOLIC) 5 MG tablet Take 1 tablet (5 mg total) by mouth daily.   nitroGLYCERIN (NITROSTAT) 0.4 MG SL tablet Place 1 tablet (0.4 mg total) under the tongue every 5 (five) minutes as needed for chest pain.   pantoprazole (PROTONIX) 40 MG tablet Take 1 tablet (40 mg total) by mouth at bedtime.   potassium chloride SA (KLOR-CON) 20 MEQ tablet Take 1 tablet (20 mEq total) by mouth daily.   sertraline (ZOLOFT) 100 MG tablet Take 1 tablet (100 mg total) by mouth daily. Stop escitalopram   No facility-administered encounter medications on file as of 03/15/2021.   Have you seen any other providers since your last visit? Patients wife stated no.  Any  changes in your medications or health? Patients wife stated no.  Any side effects from any medications? Patients wife stated no.  Do you have an symptoms or problems not managed by your medications? Patients wife stated no.  Any concerns about your health right now? Patients wife stated memory loss.  Has your provider asked that you check blood pressure, blood sugar, or follow special diet at home? Patients wife stated is limited on fluid intake.  Do you get any type of exercise on a regular basis? Patients wife stated he has a physical therapist come twice a week.   Can you think of a goal you would like to reach for your health? Patients wife stated more mobile more independent.   Do you have any problems getting your medications? Patients wife stated he is now in a program  and now everything is fine.   Is there anything that you would like to discuss during the appointment? Patients wife stated no.  Please bring medications and supplements to appointment, patients wife reminded of OTP appointment on 5/133 at 60 am.  [place], [place] Pharmacist Assistant [phone]

## 2021-03-16 ENCOUNTER — Other Ambulatory Visit: Payer: Self-pay | Admitting: Family Medicine

## 2021-03-16 ENCOUNTER — Other Ambulatory Visit: Payer: Self-pay | Admitting: Cardiology

## 2021-03-17 ENCOUNTER — Telehealth: Payer: Self-pay | Admitting: *Deleted

## 2021-03-17 DIAGNOSIS — J9621 Acute and chronic respiratory failure with hypoxia: Secondary | ICD-10-CM | POA: Diagnosis not present

## 2021-03-17 DIAGNOSIS — J9622 Acute and chronic respiratory failure with hypercapnia: Secondary | ICD-10-CM | POA: Diagnosis not present

## 2021-03-17 DIAGNOSIS — I5033 Acute on chronic diastolic (congestive) heart failure: Secondary | ICD-10-CM | POA: Diagnosis not present

## 2021-03-17 DIAGNOSIS — I152 Hypertension secondary to endocrine disorders: Secondary | ICD-10-CM | POA: Diagnosis not present

## 2021-03-17 DIAGNOSIS — E1159 Type 2 diabetes mellitus with other circulatory complications: Secondary | ICD-10-CM | POA: Diagnosis not present

## 2021-03-17 DIAGNOSIS — J441 Chronic obstructive pulmonary disease with (acute) exacerbation: Secondary | ICD-10-CM | POA: Diagnosis not present

## 2021-03-17 NOTE — Telephone Encounter (Signed)
Received call from [place], [facility] PT with [facility] [phone] telephone.   Requested VO to extend [facility] PT services 2x weekly x4 weeks for upper extremity strengthening, gait balance, transfers, and safety.   VO given.

## 2021-03-18 ENCOUNTER — Ambulatory Visit (INDEPENDENT_AMBULATORY_CARE_PROVIDER_SITE_OTHER): Payer: Medicare Other | Admitting: Pharmacist

## 2021-03-18 DIAGNOSIS — E1159 Type 2 diabetes mellitus with other circulatory complications: Secondary | ICD-10-CM

## 2021-03-18 DIAGNOSIS — I152 Hypertension secondary to endocrine disorders: Secondary | ICD-10-CM

## 2021-03-18 DIAGNOSIS — I5032 Chronic diastolic (congestive) heart failure: Secondary | ICD-10-CM | POA: Diagnosis not present

## 2021-03-18 DIAGNOSIS — E119 Type 2 diabetes mellitus without complications: Secondary | ICD-10-CM

## 2021-03-18 DIAGNOSIS — I10 Essential (primary) hypertension: Secondary | ICD-10-CM

## 2021-03-18 DIAGNOSIS — J449 Chronic obstructive pulmonary disease, unspecified: Secondary | ICD-10-CM | POA: Diagnosis not present

## 2021-03-18 NOTE — Patient Instructions (Addendum)
Visit Information  Goals Addressed            This Visit's Progress   . Track and Manage Fluids and Swelling-Heart Failure       Timeframe:  Long-Range Goal Priority:  High Start Date:   03/18/21                          Expected End Date: 09/18/21                      Follow Up Date 06/18/21   - call office if I gain more than 2 pounds in one day or 5 pounds in one week - use salt in moderation - watch for swelling in feet, ankles and legs every day    Why is this important?    It is important to check your weight daily and watch how much salt and liquids you have.   It will help you to manage your heart failure.    Notes:       Patient Care Plan: General Pharmacy (Adult)    Problem Identified: HTN, CAD, CHF, COPD, DM, HLD   Priority: High  Onset Date: 03/18/2021    Long-Range Goal: Patient-Specific Goal   Start Date: 03/18/2021  Expected End Date: 09/18/2021  This Visit's Progress: On track  Priority: High  Note:    Current Barriers:  . Unable to independently monitor therapeutic efficacy  Pharmacist Clinical Goal(s):  Marland Kitchen Patient will achieve adherence to monitoring guidelines and medication adherence to achieve therapeutic efficacy . adhere to prescribed medication regimen as evidenced by fill dates . contact provider office for questions/concerns as evidenced notation of same in electronic health record through collaboration with PharmD and provider.   Interventions: . 1:1 collaboration with [name], MD regarding development and update of comprehensive plan of care as evidenced by provider attestation and co-signature . Inter-disciplinary care team collaboration (see longitudinal plan of care) . Comprehensive medication review performed; medication list updated in electronic medical record  Hypertension (BP goal <130/80) -Controlled -Current treatment: . Losartan 50mg  daily . Nebivolol 5mg  daily -Medications previously tried: atenolol,  carvedilol, Maxzide  -Current home readings: none specific, daughter is a Marine scientist and will check occasionally, always normal  -Current exercise habits: minimal -Denies hypotensive/hypertensive symptoms -Educated on BP goals and benefits of medications for prevention of heart attack, stroke and kidney damage; Importance of home blood pressure monitoring; Proper BP monitoring technique; Symptoms of hypotension and importance of maintaining adequate hydration; -Counseled to monitor BP at home weekly or when symptomatic, document, and provide log at future appointments -Recommended to continue current medication  Hyperlipidemia: (LDL goal < 70) -Controlled -Current treatment: . Atorvastatin 80mg  daily -Medications previously tried: none noted  -Educated on Cholesterol goals;  Benefits of statin for ASCVD risk reduction; Importance of limiting foods high in cholesterol;  -Reviewed most recent lipid panel, excellent results! -Recommended to continue current medication  Diabetes (A1c goal <8%) -Controlled -Current medications: . Jardiance 25mg  daily . Metformin 500mg  two tablets po BID . Glipizide 5mg  daily -Medications previously tried: Humalog, Novolog, Metformin  -Current home glucose readings . fasting glucose: not checking regularly, denies hypoglycemia . post prandial glucose: not checking regularly -Denies hypoglycemic/hyperglycemic symptoms -Educated on A1c and blood sugar goals; Complications of diabetes including kidney damage, retinal damage, and cardiovascular disease; Prevention and management of hypoglycemic episodes; Benefits of routine self-monitoring of blood sugar; Rapid effect of glipizide lowering blood glucose -Counseled  to check feet daily and get yearly eye exams -Recommended to continue current medication Recommended eating regular meals throughout the day to minimize hypoglycemia risk, recommended monitoring glucose when symptomatic  Heart Failure (Goal:  manage symptoms and prevent exacerbations) -Controlled -Last ejection fraction: 60-65% -HF type: Diastolic -Current treatment: . Furosemide 80mg  BID . Metolazone 5mg  . Bystolic 5mg  daily -Medications previously tried: carvedilol  -Current home BP/HR readings: '"normal"  -Educated on Benefits of medications for managing symptoms and prolonging life Importance of weighing daily; if you gain more than 3 pounds in one day or 5 pounds in one week, contact providers -Recommended to continue current medication  COPD (Goal: control symptoms and prevent exacerbations) -Controlled -Current treatment  . Trelegy Ellipta 100-62.5-25mcg/inh . Albuterol HFA 90 mcg prn -Medications previously tried: none noted   -Exacerbations requiring treatment in last 6 months: none -Patient reports consistent use of maintenance inhaler -Frequency of rescue inhaler use: 3-4 times per day on strenuous activity days (therapy), 2 to 3 times per day on normal days -Counseled on Proper inhaler technique; Benefits of consistent maintenance inhaler use When to use rescue inhaler Differences between maintenance and rescue inhalers  -Reminded to rinse mouth after Trelegy use -Recommended to continue current medication Assessed patient finances. Patient currently gets extra help through Medicare.  This makes all of his medications affordable.    Patient Goals/Self-Care Activities . Patient will:  - take medications as prescribed check glucose when symptomatic, document, and provide at future appointments check blood pressure weekly, document, and provide at future appointments weigh daily, and contact provider if weight gain of 3 lbs in a day or 5 lbs in one week  Follow Up Plan: The care management team will reach out to the patient again over the next 120 days.       Todd [name] was given information about Chronic Care Management services today including:  1. CCM service includes personalized support from  designated clinical staff supervised by his physician, including individualized plan of care and coordination with other care providers 2. 24/7 contact phone numbers for assistance for urgent and routine care needs. 3. Standard insurance, coinsurance, copays and deductibles apply for chronic care management only during months in which we provide at least 20 minutes of these services. Most insurances cover these services at 100%, however patients may be responsible for any copay, coinsurance and/or deductible if applicable. This service may help you avoid the need for more expensive face-to-face services. 4. Only one practitioner may furnish and bill the service in a calendar month. 5. The patient may stop CCM services at any time (effective at the end of the month) by phone call to the office staff.  Patient agreed to services and verbal consent obtained.   The patient verbalized understanding of instructions, educational materials, and care plan provided today and agreed to receive a mailed copy of patient instructions, educational materials, and care plan.  Telephone follow up appointment with pharmacy team member scheduled for: 4 months  Edythe Clarity, [facility]  COPD and Physical Activity Chronic obstructive pulmonary disease (COPD) is a long-term (chronic) condition that affects the lungs. COPD is a general term that can be used to describe many different lung problems that cause lung swelling (inflammation) and limit airflow, including chronic bronchitis and emphysema. The main symptom of COPD is shortness of breath, which makes it harder to do even simple tasks. This can also make it harder to exercise and be active. Talk with your health care provider about  treatments to help you breathe better and actions you can take to prevent breathing problems during physical activity. What are the benefits of exercising with COPD? Exercising regularly is an important part of a healthy lifestyle. You can  still exercise and do physical activities even though you have COPD. Exercise and physical activity improve your shortness of breath by increasing blood flow (circulation). This causes your heart to pump more oxygen through your body. Moderate exercise can improve your:  Oxygen use.  Energy level.  Shortness of breath.  Strength in your breathing muscles.  Heart health.  Sleep.  Self-esteem and feelings of self-worth.  Depression, stress, and anxiety levels. Exercise can benefit everyone with COPD. The severity of your disease may affect how hard you can exercise, especially at first, but everyone can benefit. Talk with your health care provider about how much exercise is safe for you, and which activities and exercises are safe for you.   What actions can I take to prevent breathing problems during physical activity?  Sign up for a pulmonary rehabilitation program. This type of program may include: ? Education about lung diseases. ? Exercise classes that teach you how to exercise and be more active while improving your breathing. This usually involves:  Exercise using your lower extremities, such as a stationary bicycle.  About 30 minutes of exercise, 2 to 5 times per week, for 6 to 12 weeks  Strength training, such as push ups or leg lifts. ? Nutrition education. ? Group classes in which you can talk with others who also have COPD and learn ways to manage stress.  If you use an oxygen tank, you should use it while you exercise. Work with your health care provider to adjust your oxygen for your physical activity. Your resting flow rate is different from your flow rate during physical activity.  While you are exercising: ? Take slow breaths. ? Pace yourself and do not try to go too fast. ? Purse your lips while breathing out. Pursing your lips is similar to a kissing or whistling position. ? If doing exercise that uses a quick burst of effort, such as weight lifting:  Breathe  in before starting the exercise.  Breathe out during the hardest part of the exercise (such as raising the weights). Where to find support You can find support for exercising with COPD from:  Your health care provider.  A pulmonary rehabilitation program.  Your local health department or community health programs.  Support groups, online or in-person. Your health care provider may be able to recommend support groups. Where to find more information You can find more information about exercising with COPD from:  American Lung Association: ClassInsider.se.  COPD Foundation: https://www.rivera.net/. Contact a health care provider if:  Your symptoms get worse.  You have chest pain.  You have nausea.  You have a fever.  You have trouble talking or catching your breath.  You want to start a new exercise program or a new activity. Summary  COPD is a general term that can be used to describe many different lung problems that cause lung swelling (inflammation) and limit airflow. This includes chronic bronchitis and emphysema.  Exercise and physical activity improve your shortness of breath by increasing blood flow (circulation). This causes your heart to provide more oxygen to your body.  Contact your health care provider before starting any exercise program or new activity. Ask your health care provider what exercises and activities are safe for you. This information is not  intended to replace advice given to you by your health care provider. Make sure you discuss any questions you have with your health care provider. Document Revised: 02/12/2019 Document Reviewed: 11/15/2017 Elsevier Patient Education  2021 [employer].

## 2021-03-22 ENCOUNTER — Telehealth: Payer: Self-pay | Admitting: Family Medicine

## 2021-03-22 DIAGNOSIS — J9621 Acute and chronic respiratory failure with hypoxia: Secondary | ICD-10-CM | POA: Diagnosis not present

## 2021-03-22 DIAGNOSIS — J441 Chronic obstructive pulmonary disease with (acute) exacerbation: Secondary | ICD-10-CM | POA: Diagnosis not present

## 2021-03-22 DIAGNOSIS — I152 Hypertension secondary to endocrine disorders: Secondary | ICD-10-CM | POA: Diagnosis not present

## 2021-03-22 DIAGNOSIS — E1159 Type 2 diabetes mellitus with other circulatory complications: Secondary | ICD-10-CM | POA: Diagnosis not present

## 2021-03-22 DIAGNOSIS — I5033 Acute on chronic diastolic (congestive) heart failure: Secondary | ICD-10-CM | POA: Diagnosis not present

## 2021-03-22 DIAGNOSIS — J9622 Acute and chronic respiratory failure with hypercapnia: Secondary | ICD-10-CM | POA: Diagnosis not present

## 2021-03-22 NOTE — Telephone Encounter (Addendum)
Call placed to patient wife, [name].   Reports that they have not been checking his weight daily. States that she did weigh patient on Friday, and noted weight to be 190lbs. Patient weight is now 190.6 lbs. Patient is currently taking Lasix 80mg  PO Q AM. He has not taken Zaroxolyn since 03/09/2021.  Reports that edema has improved and breathing is better. Reports that patient only has wheezing when he is not using O2 as directed.   Please advise.

## 2021-03-22 NOTE — Telephone Encounter (Signed)
190 is good. I would not take Zaroxolyn again unless swelling worsens.  Continue current dose of his other diuretic

## 2021-03-22 NOTE — Telephone Encounter (Signed)
Patient's wife [name] called to give patient's current weight of 190.6 lbs. Please advise if patient should adjust Lasix Rx (taking 80 mg) at [phone], or [phone].

## 2021-03-23 DIAGNOSIS — J449 Chronic obstructive pulmonary disease, unspecified: Secondary | ICD-10-CM | POA: Diagnosis not present

## 2021-03-23 DIAGNOSIS — R269 Unspecified abnormalities of gait and mobility: Secondary | ICD-10-CM | POA: Diagnosis not present

## 2021-03-23 DIAGNOSIS — J9811 Atelectasis: Secondary | ICD-10-CM | POA: Diagnosis not present

## 2021-03-23 NOTE — Telephone Encounter (Signed)
Call placed to patient and patient wife [name] made aware.   Agreeable to plan.   Prescription sent to pharmacy.

## 2021-03-24 ENCOUNTER — Ambulatory Visit (INDEPENDENT_AMBULATORY_CARE_PROVIDER_SITE_OTHER): Payer: Medicare Other

## 2021-03-24 ENCOUNTER — Ambulatory Visit: Payer: Self-pay | Admitting: Family Medicine

## 2021-03-24 DIAGNOSIS — I455 Other specified heart block: Secondary | ICD-10-CM

## 2021-03-24 DIAGNOSIS — E1159 Type 2 diabetes mellitus with other circulatory complications: Secondary | ICD-10-CM | POA: Diagnosis not present

## 2021-03-24 DIAGNOSIS — I5033 Acute on chronic diastolic (congestive) heart failure: Secondary | ICD-10-CM | POA: Diagnosis not present

## 2021-03-24 DIAGNOSIS — J9621 Acute and chronic respiratory failure with hypoxia: Secondary | ICD-10-CM | POA: Diagnosis not present

## 2021-03-24 DIAGNOSIS — J441 Chronic obstructive pulmonary disease with (acute) exacerbation: Secondary | ICD-10-CM | POA: Diagnosis not present

## 2021-03-24 DIAGNOSIS — J9622 Acute and chronic respiratory failure with hypercapnia: Secondary | ICD-10-CM | POA: Diagnosis not present

## 2021-03-24 DIAGNOSIS — I152 Hypertension secondary to endocrine disorders: Secondary | ICD-10-CM | POA: Diagnosis not present

## 2021-03-24 LAB — CUP PACEART REMOTE DEVICE CHECK
Battery Remaining Longevity: 175 mo
Battery Voltage: 3.19 V
Brady Statistic AP VP Percent: 0.02 %
Brady Statistic AP VS Percent: 25.96 %
Brady Statistic AS VP Percent: 0.01 %
Brady Statistic AS VS Percent: 74.02 %
Brady Statistic RA Percent Paced: 26.16 %
Brady Statistic RV Percent Paced: 0.03 %
Date Time Interrogation Session: 20220519014533
Implantable Lead Implant Date: 20211117
Implantable Lead Implant Date: 20211117
Implantable Lead Location: 753859
Implantable Lead Location: 753860
Implantable Lead Model: 5076
Implantable Lead Model: 5076
Implantable Pulse Generator Implant Date: 20211117
Lead Channel Impedance Value: 323 Ohm
Lead Channel Impedance Value: 380 Ohm
Lead Channel Impedance Value: 399 Ohm
Lead Channel Impedance Value: 456 Ohm
Lead Channel Pacing Threshold Amplitude: 0.625 V
Lead Channel Pacing Threshold Amplitude: 0.625 V
Lead Channel Pacing Threshold Pulse Width: 0.4 ms
Lead Channel Pacing Threshold Pulse Width: 0.4 ms
Lead Channel Sensing Intrinsic Amplitude: 1.375 mV
Lead Channel Sensing Intrinsic Amplitude: 1.375 mV
Lead Channel Sensing Intrinsic Amplitude: 7.875 mV
Lead Channel Sensing Intrinsic Amplitude: 7.875 mV
Lead Channel Setting Pacing Amplitude: 1.5 V
Lead Channel Setting Pacing Amplitude: 2 V
Lead Channel Setting Pacing Pulse Width: 0.4 ms
Lead Channel Setting Sensing Sensitivity: 1.2 mV

## 2021-03-25 DIAGNOSIS — J9811 Atelectasis: Secondary | ICD-10-CM | POA: Diagnosis not present

## 2021-03-25 DIAGNOSIS — R269 Unspecified abnormalities of gait and mobility: Secondary | ICD-10-CM | POA: Diagnosis not present

## 2021-03-25 DIAGNOSIS — J449 Chronic obstructive pulmonary disease, unspecified: Secondary | ICD-10-CM | POA: Diagnosis not present

## 2021-03-29 ENCOUNTER — Other Ambulatory Visit: Payer: Self-pay

## 2021-03-29 ENCOUNTER — Encounter: Payer: Self-pay | Admitting: Family Medicine

## 2021-03-29 ENCOUNTER — Ambulatory Visit (INDEPENDENT_AMBULATORY_CARE_PROVIDER_SITE_OTHER): Payer: Medicare Other | Admitting: Family Medicine

## 2021-03-29 VITALS — BP 98/48 | HR 88 | Temp 98.2°F | Resp 16 | Ht 67.0 in | Wt 184.0 lb

## 2021-03-29 DIAGNOSIS — E119 Type 2 diabetes mellitus without complications: Secondary | ICD-10-CM | POA: Diagnosis not present

## 2021-03-29 DIAGNOSIS — I5032 Chronic diastolic (congestive) heart failure: Secondary | ICD-10-CM | POA: Diagnosis not present

## 2021-03-29 DIAGNOSIS — M25511 Pain in right shoulder: Secondary | ICD-10-CM | POA: Diagnosis not present

## 2021-03-29 DIAGNOSIS — J9622 Acute and chronic respiratory failure with hypercapnia: Secondary | ICD-10-CM | POA: Diagnosis not present

## 2021-03-29 DIAGNOSIS — R600 Localized edema: Secondary | ICD-10-CM | POA: Diagnosis not present

## 2021-03-29 DIAGNOSIS — E1159 Type 2 diabetes mellitus with other circulatory complications: Secondary | ICD-10-CM | POA: Diagnosis not present

## 2021-03-29 DIAGNOSIS — J441 Chronic obstructive pulmonary disease with (acute) exacerbation: Secondary | ICD-10-CM | POA: Diagnosis not present

## 2021-03-29 DIAGNOSIS — I10 Essential (primary) hypertension: Secondary | ICD-10-CM | POA: Diagnosis not present

## 2021-03-29 DIAGNOSIS — J9621 Acute and chronic respiratory failure with hypoxia: Secondary | ICD-10-CM | POA: Diagnosis not present

## 2021-03-29 DIAGNOSIS — I152 Hypertension secondary to endocrine disorders: Secondary | ICD-10-CM | POA: Diagnosis not present

## 2021-03-29 DIAGNOSIS — I5033 Acute on chronic diastolic (congestive) heart failure: Secondary | ICD-10-CM | POA: Diagnosis not present

## 2021-03-29 MED ORDER — SULFAMETHOXAZOLE-TRIMETHOPRIM 800-160 MG PO TABS: 1.0000 | ORAL_TABLET | Freq: Two times a day (BID) | ORAL | 0 refills | Status: DC

## 2021-03-29 NOTE — Progress Notes (Signed)
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 Narrative  . Not on file   Social Determinants of Health   Financial Resource Strain: Low Risk   . Difficulty of Paying Living Expenses: Not very hard  Food Insecurity: Not on file  Transportation Needs: Not on file  Physical Activity: Not on file  Stress: Not on file  Social Connections: Not on file  Intimate Partner Violence: Not on file     Review of Systems  All other systems reviewed and are negative.      Objective:   Physical Exam Vitals reviewed.  Constitutional:      General: He is not in acute distress.    Appearance: Normal appearance. He is not ill-appearing, toxic-appearing or diaphoretic.  Cardiovascular:     Rate and Rhythm: Normal rate and regular rhythm.     Pulses: Normal pulses.     Heart sounds: Normal heart sounds. No murmur heard. No friction rub. No gallop.   Pulmonary:     Effort: Pulmonary effort is normal. No accessory muscle usage, prolonged expiration or respiratory distress.     Breath sounds: Decreased air movement present. No stridor. No decreased breath sounds, wheezing, rhonchi or rales.  Chest:     Chest wall: No tenderness.  Musculoskeletal:     Right lower leg: Edema absent     Left lower leg:  Edemaabsent Pain with abduction greater than 90 degrees.  Positive Hawking sign.  Positive empty can sign.  No crepitus.  No tenderness to palpation over the distal clavicle or acromion or glenohumeral joint Neurological:     Mental Status: He is alert.           Assessment & Plan:  Chronic diastolic CHF (congestive heart failure) (HCC) - Plan: BASIC METABOLIC PANEL WITH GFR  Bilateral leg edema  Benign essential HTN  Type 2 diabetes mellitus without complication, unspecified whether long term insulin use (HCC)  Acute pain of right shoulder  With 12 pounds of weight loss, the patient appears dehydrated and is hypotensive.  I believe this is causing the falls.  Therefore first, I have asked the patient to stop Zaroxolyn completely.  Second I recommended that he reduce Lasix from 80 mg twice a day to 40 mg once a day.  I  believe that this will cause his blood pressure to improve and help reduce his risk of falling.  The edema is essentially resolved.  I encouraged him to continue to wear TED hose because I feel that this is helping substantially.  Due to the hypoglycemia from his reduced diet I want him to hold glipizide and continue to monitor his blood sugar.  I will ask him to check his weight every day and call me if his weight is over 3 pounds in 1 day or if he loses more than 2 pounds in a day so that we can adjust his diuretics further.  I believe he may have injured his rotator cuff when he fell on his right shoulder.  Using sterile technique, I injected the right shoulder with 2 cc lidocaine, 2 cc of Marcaine, and 2 cc of 40 mg/mL Kenalog.

## 2021-03-30 DIAGNOSIS — J9622 Acute and chronic respiratory failure with hypercapnia: Secondary | ICD-10-CM | POA: Diagnosis not present

## 2021-03-30 DIAGNOSIS — E1159 Type 2 diabetes mellitus with other circulatory complications: Secondary | ICD-10-CM | POA: Diagnosis not present

## 2021-03-30 DIAGNOSIS — J9621 Acute and chronic respiratory failure with hypoxia: Secondary | ICD-10-CM | POA: Diagnosis not present

## 2021-03-30 DIAGNOSIS — J441 Chronic obstructive pulmonary disease with (acute) exacerbation: Secondary | ICD-10-CM | POA: Diagnosis not present

## 2021-03-30 DIAGNOSIS — I5033 Acute on chronic diastolic (congestive) heart failure: Secondary | ICD-10-CM | POA: Diagnosis not present

## 2021-03-30 DIAGNOSIS — I152 Hypertension secondary to endocrine disorders: Secondary | ICD-10-CM | POA: Diagnosis not present

## 2021-03-30 LAB — BASIC METABOLIC PANEL WITH GFR
BUN/Creatinine Ratio: 26 (calc) — ABNORMAL HIGH (ref 6–22)
BUN: 27 mg/dL — ABNORMAL HIGH (ref 7–25)
CO2: 33 mmol/L — ABNORMAL HIGH (ref 20–32)
Calcium: 9.5 mg/dL (ref 8.6–10.3)
Chloride: 90 mmol/L — ABNORMAL LOW (ref 98–110)
Creat: 1.05 mg/dL (ref 0.70–1.25)
GFR, Est African American: 88 mL/min/{1.73_m2} (ref >=60)
GFR, Est Non African American: 76 mL/min/{1.73_m2} (ref >=60)
Glucose, Bld: 126 mg/dL — ABNORMAL HIGH (ref 65–99)
Potassium: 3.5 mmol/L (ref 3.5–5.3)
Sodium: 138 mmol/L (ref 135–146)

## 2021-03-31 ENCOUNTER — Telehealth: Payer: Self-pay | Admitting: Family Medicine

## 2021-03-31 NOTE — Telephone Encounter (Signed)
Awaiting forms

## 2021-03-31 NOTE — Telephone Encounter (Signed)
Pt's daughter came in to have a certification of disability form filled out. Form placed in nurse's folder.  Cb#: [phone]

## 2021-04-01 NOTE — Telephone Encounter (Signed)
Routed to provider

## 2021-04-06 ENCOUNTER — Other Ambulatory Visit: Payer: Self-pay

## 2021-04-06 ENCOUNTER — Ambulatory Visit (HOSPITAL_BASED_OUTPATIENT_CLINIC_OR_DEPARTMENT_OTHER): Payer: Medicare Other | Admitting: Pulmonary Disease

## 2021-04-06 ENCOUNTER — Telehealth: Payer: Self-pay | Admitting: Family Medicine

## 2021-04-06 DIAGNOSIS — J9811 Atelectasis: Secondary | ICD-10-CM | POA: Diagnosis not present

## 2021-04-06 DIAGNOSIS — R269 Unspecified abnormalities of gait and mobility: Secondary | ICD-10-CM | POA: Diagnosis not present

## 2021-04-06 DIAGNOSIS — J449 Chronic obstructive pulmonary disease, unspecified: Secondary | ICD-10-CM | POA: Diagnosis not present

## 2021-04-06 NOTE — Telephone Encounter (Signed)
Patient's daughter [name] called to ask if he should discontinue taking Lasix or any other medications for his sleep study which is taking place tonight. Please advise at [phone].

## 2021-04-06 NOTE — Telephone Encounter (Signed)
Clarification:  Advised that we do not require any medication changes or to hold any for sleep study, but to contact Dr. Lamonte Sakai who ordered study for further clarification.

## 2021-04-06 NOTE — Telephone Encounter (Signed)
Spoke with nurse Christina Six. Nurse advised that patient would not need to discontinue any of his medications for the sleep study, and that he would need to speak with provider who originally ordered Lasix for patient (Dr. West Carbo) for a more specific answer. Called patient and relayed message.

## 2021-04-07 DIAGNOSIS — I152 Hypertension secondary to endocrine disorders: Secondary | ICD-10-CM | POA: Diagnosis not present

## 2021-04-07 DIAGNOSIS — I5033 Acute on chronic diastolic (congestive) heart failure: Secondary | ICD-10-CM | POA: Diagnosis not present

## 2021-04-07 DIAGNOSIS — J9621 Acute and chronic respiratory failure with hypoxia: Secondary | ICD-10-CM | POA: Diagnosis not present

## 2021-04-07 DIAGNOSIS — J9622 Acute and chronic respiratory failure with hypercapnia: Secondary | ICD-10-CM | POA: Diagnosis not present

## 2021-04-07 DIAGNOSIS — J441 Chronic obstructive pulmonary disease with (acute) exacerbation: Secondary | ICD-10-CM | POA: Diagnosis not present

## 2021-04-07 DIAGNOSIS — E1159 Type 2 diabetes mellitus with other circulatory complications: Secondary | ICD-10-CM | POA: Diagnosis not present

## 2021-04-12 DIAGNOSIS — I152 Hypertension secondary to endocrine disorders: Secondary | ICD-10-CM | POA: Diagnosis not present

## 2021-04-12 DIAGNOSIS — I5033 Acute on chronic diastolic (congestive) heart failure: Secondary | ICD-10-CM | POA: Diagnosis not present

## 2021-04-12 DIAGNOSIS — J9621 Acute and chronic respiratory failure with hypoxia: Secondary | ICD-10-CM | POA: Diagnosis not present

## 2021-04-12 DIAGNOSIS — E1159 Type 2 diabetes mellitus with other circulatory complications: Secondary | ICD-10-CM | POA: Diagnosis not present

## 2021-04-12 DIAGNOSIS — J9622 Acute and chronic respiratory failure with hypercapnia: Secondary | ICD-10-CM | POA: Diagnosis not present

## 2021-04-12 DIAGNOSIS — J441 Chronic obstructive pulmonary disease with (acute) exacerbation: Secondary | ICD-10-CM | POA: Diagnosis not present

## 2021-04-14 DIAGNOSIS — J9621 Acute and chronic respiratory failure with hypoxia: Secondary | ICD-10-CM | POA: Diagnosis not present

## 2021-04-14 DIAGNOSIS — J441 Chronic obstructive pulmonary disease with (acute) exacerbation: Secondary | ICD-10-CM | POA: Diagnosis not present

## 2021-04-14 DIAGNOSIS — J9622 Acute and chronic respiratory failure with hypercapnia: Secondary | ICD-10-CM | POA: Diagnosis not present

## 2021-04-14 DIAGNOSIS — I5033 Acute on chronic diastolic (congestive) heart failure: Secondary | ICD-10-CM | POA: Diagnosis not present

## 2021-04-14 DIAGNOSIS — I152 Hypertension secondary to endocrine disorders: Secondary | ICD-10-CM | POA: Diagnosis not present

## 2021-04-14 DIAGNOSIS — E1159 Type 2 diabetes mellitus with other circulatory complications: Secondary | ICD-10-CM | POA: Diagnosis not present

## 2021-04-15 NOTE — Progress Notes (Signed)
Remote pacemaker transmission.   

## 2021-04-18 ENCOUNTER — Telehealth: Payer: Self-pay | Admitting: Family Medicine

## 2021-04-18 NOTE — Telephone Encounter (Signed)
Received call from Mrs. Sensing. Patient's weight is fluctuating a little. She wants to know if he should stop taking the lasix, decrease the dose, or do something else. Please advise at [phone].

## 2021-04-18 NOTE — Telephone Encounter (Signed)
Call placed to patient wife [name].   Reports that daily weights are as follows:  Weight  9-Jun 181.0 lbs  10-Jun 181.9 lbs  11-Jun 177.4 lbs  12-Jun 179.0 lbs  13-Jun 179.2 lbs   Patient is currently taking Furosemide 40mg  PO Q AM.   Please advise.

## 2021-04-19 DIAGNOSIS — J449 Chronic obstructive pulmonary disease, unspecified: Secondary | ICD-10-CM | POA: Diagnosis not present

## 2021-04-19 DIAGNOSIS — J9610 Chronic respiratory failure, unspecified whether with hypoxia or hypercapnia: Secondary | ICD-10-CM | POA: Diagnosis not present

## 2021-04-19 NOTE — Telephone Encounter (Signed)
Spoke with wife, verbalized understanding

## 2021-04-21 ENCOUNTER — Other Ambulatory Visit: Payer: Self-pay | Admitting: Family Medicine

## 2021-04-23 DIAGNOSIS — J449 Chronic obstructive pulmonary disease, unspecified: Secondary | ICD-10-CM | POA: Diagnosis not present

## 2021-04-23 DIAGNOSIS — J9811 Atelectasis: Secondary | ICD-10-CM | POA: Diagnosis not present

## 2021-04-23 DIAGNOSIS — R269 Unspecified abnormalities of gait and mobility: Secondary | ICD-10-CM | POA: Diagnosis not present

## 2021-04-25 DIAGNOSIS — J449 Chronic obstructive pulmonary disease, unspecified: Secondary | ICD-10-CM | POA: Diagnosis not present

## 2021-04-25 DIAGNOSIS — R269 Unspecified abnormalities of gait and mobility: Secondary | ICD-10-CM | POA: Diagnosis not present

## 2021-04-25 DIAGNOSIS — J9811 Atelectasis: Secondary | ICD-10-CM | POA: Diagnosis not present

## 2021-04-29 ENCOUNTER — Other Ambulatory Visit: Payer: Self-pay | Admitting: Family Medicine

## 2021-04-29 ENCOUNTER — Ambulatory Visit (INDEPENDENT_AMBULATORY_CARE_PROVIDER_SITE_OTHER): Payer: Medicare Other | Admitting: Family Medicine

## 2021-04-29 ENCOUNTER — Other Ambulatory Visit: Payer: Self-pay

## 2021-04-29 ENCOUNTER — Encounter: Payer: Self-pay | Admitting: Family Medicine

## 2021-04-29 VITALS — BP 102/68 | HR 92 | Temp 98.3°F | Resp 18 | Ht 67.0 in | Wt 178.0 lb

## 2021-04-29 DIAGNOSIS — L89312 Pressure ulcer of right buttock, stage 2: Secondary | ICD-10-CM | POA: Diagnosis not present

## 2021-04-29 DIAGNOSIS — E119 Type 2 diabetes mellitus without complications: Secondary | ICD-10-CM

## 2021-04-29 DIAGNOSIS — I5032 Chronic diastolic (congestive) heart failure: Secondary | ICD-10-CM | POA: Diagnosis not present
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
## 2021-04-30 LAB — COMPLETE METABOLIC PANEL WITH GFR
AG Ratio: 1.6 (calc) (ref 1.0–2.5)
ALT: 13 U/L (ref 9–46)
AST: 19 U/L (ref 10–35)
Albumin: 3.9 g/dL (ref 3.6–5.1)
Alkaline phosphatase (APISO): 79 U/L (ref 35–144)
BUN: 9 mg/dL (ref 7–25)
CO2: 29 mmol/L (ref 20–32)
Calcium: 8.9 mg/dL (ref 8.6–10.3)
Chloride: 97 mmol/L — ABNORMAL LOW (ref 98–110)
Creat: 0.77 mg/dL (ref 0.70–1.25)
GFR, Est African American: 113 mL/min/{1.73_m2} (ref >=60)
GFR, Est Non African American: 97 mL/min/{1.73_m2} (ref >=60)
Globulin: 2.5 g/dL (calc) (ref 1.9–3.7)
Glucose, Bld: 113 mg/dL — ABNORMAL HIGH (ref 65–99)
Potassium: 3.8 mmol/L (ref 3.5–5.3)
Sodium: 140 mmol/L (ref 135–146)
Total Bilirubin: 0.4 mg/dL (ref 0.2–1.2)
Total Protein: 6.4 g/dL (ref 6.1–8.1)

## 2021-04-30 LAB — CBC WITH DIFFERENTIAL/PLATELET
Absolute Monocytes: 998 cells/uL — ABNORMAL HIGH (ref 200–950)
Basophils Absolute: 30 cells/uL (ref 0–200)
Basophils Relative: 0.2 %
Eosinophils Absolute: 75 cells/uL (ref 15–500)
Eosinophils Relative: 0.5 %
HCT: 35.3 % — ABNORMAL LOW (ref 38.5–50.0)
Hemoglobin: 10.8 g/dL — ABNORMAL LOW (ref 13.2–17.1)
Lymphs Abs: 1192 cells/uL (ref 850–3900)
MCH: 25.2 pg — ABNORMAL LOW (ref 27.0–33.0)
MCHC: 30.6 g/dL — ABNORMAL LOW (ref 32.0–36.0)
MCV: 82.3 fL (ref 80.0–100.0)
MPV: 10.1 fL (ref 7.5–12.5)
Monocytes Relative: 6.7 %
Neutro Abs: 12605 cells/uL — ABNORMAL HIGH (ref 1500–7800)
Neutrophils Relative %: 84.6 %
Platelets: 300 10*3/uL (ref 140–400)
RBC: 4.29 10*6/uL (ref 4.20–5.80)
RDW: 14.6 % (ref 11.0–15.0)
Total Lymphocyte: 8 %
WBC: 14.9 10*3/uL — ABNORMAL HIGH (ref 3.8–10.8)

## 2021-04-30 LAB — HEMOGLOBIN A1C
Hgb A1c MFr Bld: 6.1 % of total Hgb — ABNORMAL HIGH (ref <5.7)
Mean Plasma Glucose: 128 mg/dL
eAG (mmol/L): 7.1 mmol/L

## 2021-05-02 ENCOUNTER — Telehealth: Payer: Self-pay | Admitting: Family Medicine

## 2021-05-02 NOTE — Telephone Encounter (Signed)
Call placed to patient wife, [name].   Reports that patient has dry cough, but not other Sx.

## 2021-05-02 NOTE — Telephone Encounter (Signed)
Pt's wife called in wanting to discuss lab results, and to find out if pt has an infection that may require some medication.  Please advise  Cb#: [phone]

## 2021-05-02 NOTE — Telephone Encounter (Signed)
Patient WBC noted >14.  Patient was seen for open wound to sacrum.   Please advise.

## 2021-05-03 NOTE — Telephone Encounter (Signed)
Appointment scheduled.   Call placed to patient and patient wife made aware.

## 2021-05-06 ENCOUNTER — Ambulatory Visit: Payer: Self-pay | Admitting: Nurse Practitioner

## 2021-05-06 ENCOUNTER — Telehealth: Payer: Self-pay | Admitting: Family Medicine

## 2021-05-06 NOTE — Telephone Encounter (Signed)
Received call from patient's spouse Todd [name] to cancel patient's appointment for this morning. Patient's daughter was his transportation but went into labor and had her baby. Patient will call back next week to reschedule when his other daughter comes into town and makes her availability known.

## 2021-05-08 DIAGNOSIS — J9811 Atelectasis: Secondary | ICD-10-CM | POA: Diagnosis not present

## 2021-05-08 DIAGNOSIS — J449 Chronic obstructive pulmonary disease, unspecified: Secondary | ICD-10-CM | POA: Diagnosis not present

## 2021-05-08 DIAGNOSIS — R269 Unspecified abnormalities of gait and mobility: Secondary | ICD-10-CM | POA: Diagnosis not present

## 2021-05-17 ENCOUNTER — Ambulatory Visit: Payer: Medicare Other | Admitting: Physician Assistant

## 2021-05-17 ENCOUNTER — Encounter (HOSPITAL_BASED_OUTPATIENT_CLINIC_OR_DEPARTMENT_OTHER): Payer: Medicare Other | Admitting: Pulmonary Disease

## 2021-05-19 DIAGNOSIS — J449 Chronic obstructive pulmonary disease, unspecified: Secondary | ICD-10-CM | POA: Diagnosis not present

## 2021-05-19 DIAGNOSIS — J9610 Chronic respiratory failure, unspecified whether with hypoxia or hypercapnia: Secondary | ICD-10-CM | POA: Diagnosis not present

## 2021-05-20 ENCOUNTER — Telehealth: Payer: Self-pay | Admitting: Pharmacist

## 2021-05-20 ENCOUNTER — Other Ambulatory Visit: Payer: Self-pay | Admitting: Family Medicine

## 2021-05-20 DIAGNOSIS — K219 Gastro-esophageal reflux disease without esophagitis: Secondary | ICD-10-CM

## 2021-05-20 MED ORDER — PANTOPRAZOLE SODIUM 40 MG PO TBEC: 40.0000 mg | DELAYED_RELEASE_TABLET | Freq: Every day | ORAL | 0 refills | Status: DC

## 2021-05-20 NOTE — Telephone Encounter (Signed)
Ok to refill??  Last office visit 04/29/2021.  Last refill 02/21/2021, #1 refill.

## 2021-05-20 NOTE — Progress Notes (Addendum)
Chronic Care Management Pharmacy Assistant   Name: Todd [name]  MRN: [id number] DOB: [birth year]  Reason for Encounter: Disease State For DM.    Conditions to be addressed/monitored: HTN, CAD, CHF, COPD, DM, HLD   Recent office visits:  04/29/21 Dr. Dennard Schaumann For wound check/follow-up. Per note: Dr. Dennard Schaumann recommended that they stop Lasix and use this only as needed 03/29/21 Dr. Dennard Schaumann For follow-up. STARTED Sulfamethoxzole-Trimethoprim 800-160 mg 1 tablet 2 times daily. Per note: Dr. Dennard Schaumann STOPPED Zaroxolyn, recommended that he REDUCE Lasix from 80 mg twice a day to 40 mg once a day, lastly the doctor stated due to the hypoglycemia from his reduced diet I want him to hold glipizide and continue to monitor his blood sugar.  Recent consult visits:  None since 03/18/21  Hospital visits:  None since 03/18/21  Medications: Outpatient Encounter Medications as of 05/20/2021  Medication Sig Note   albuterol (VENTOLIN HFA) 108 (90 Base) MCG/ACT inhaler Inhale 2 puffs into the lungs every 6 (six) hours as needed for wheezing or shortness of breath.    aspirin EC 81 MG tablet Take 81 mg by mouth daily.    atorvastatin (LIPITOR) 80 MG tablet TAKE 1 TABLET BY MOUTH DAILY IN THE EVENING AT 6:00PM (Patient taking differently: Take 80 mg by mouth every evening. TAKE 1 TABLET BY MOUTH DAILY BID) 03/18/2021: Once daily   blood glucose meter kit and supplies 1 each by Other route as directed. Dispense based on patient and insurance preference. Use up to four times daily as directed. (FOR ICD-10 E10.9, E11.9).    calcium carbonate (TUMS - DOSED IN MG ELEMENTAL CALCIUM) 500 MG chewable tablet Chew 2 tablets by mouth as needed for indigestion or heartburn.     clonazePAM (KLONOPIN) 0.5 MG tablet Take 1 tablet (0.5 mg total) by mouth 3 (three) times daily as needed for anxiety (stop ativan).    famotidine (PEPCID) 20 MG tablet TAKE 1 TABLET BY MOUTH DAILY AS NEEDED FOR HEARTBURN OR INDIGESTION (Patient  taking differently: Take 20 mg by mouth daily as needed for indigestion or heartburn.)    Fluticasone-Umeclidin-Vilant (TRELEGY ELLIPTA) 100-62.5-25 MCG/INH AEPB INHALE 1 PUFF INTO THE LUNGS EVERY MORNING    furosemide (LASIX) 80 MG tablet Take 1 tablet (80 mg total) by mouth 2 (two) times daily.    JARDIANCE 25 MG TABS tablet TAKE 1 TABLET(25 MG) BY MOUTH DAILY BEFORE BREAKFAST    losartan (COZAAR) 50 MG tablet Take 50 mg by mouth daily.    metFORMIN (GLUCOPHAGE) 500 MG tablet Take 2 tablets (1,000 mg total) by mouth 2 (two) times daily.    nebivolol (BYSTOLIC) 5 MG tablet Take 1 tablet (5 mg total) by mouth daily.    nitroGLYCERIN (NITROSTAT) 0.4 MG SL tablet DISSOLVE 1 TABLET UNDER THE TONGUE EVERY 5 MINUTES AS NEEDED FOR CHEST PAIN AS DIRECTED    pantoprazole (PROTONIX) 40 MG tablet Take 1 tablet (40 mg total) by mouth at bedtime.    potassium chloride SA (KLOR-CON) 20 MEQ tablet Take 1 tablet (20 mEq total) by mouth daily.    sertraline (ZOLOFT) 100 MG tablet TAKE 1 TABLET(100 MG) BY MOUTH DAILY. STOP ESCITALOPRAM    No facility-administered encounter medications on file as of 05/20/2021.    Recent Relevant Labs: Lab Results  Component Value Date/Time   HGBA1C 6.1 (H) 04/29/2021 01:00 PM   HGBA1C 7.8 (H) 01/07/2021 04:59 PM   MICROALBUR 1.2 12/01/2019 09:10 AM    Kidney Function Lab Results  Component  Value Date/Time   CREATININE 0.77 04/29/2021 01:00 PM   CREATININE 1.05 03/29/2021 10:07 AM   GFR 110.04 06/24/2013 12:34 PM   GFRNONAA 97 04/29/2021 01:00 PM   GFRAA 113 04/29/2021 01:00 PM    Current antihyperglycemic regimen:  Jardiance 24m daily Metformin 5078mtwo tablets po BID  What recent interventions/DTPs have been made to improve glycemic control:  None  Have there been any recent hospitalizations or ED visits since last visit with CPP? Patient stated no.  Patient denies hypoglycemic symptoms, including None  Patient denies hyperglycemic symptoms, including  fatigue and weakness  How often are you checking your blood sugar? Patient stated his wife  only checks if he is experiencing symptoms.   What are your blood sugars ranging? Patient did not have any recent blood sugar readings to give me.  During the week, how often does your blood glucose drop below 70? Patient stated Never  Are you checking your feet daily/regularly?  Patient stated him and his wife checks his feet regularly.   Adherence Review: Is the patient currently on a STATIN medication? Atorvastatin 80 mg  Is the patient currently on ACE/ARB medication? Losartan 50 mg  Does the patient have >5 day gap between last estimated fill dates? Per misc rpts, no.  Patient stated he is not taking Glipizide any longer.  Star Rating Drugs: Jardiance 25 mg 90 DS 05/10/21, Metformin 500 mg 90 DS 03/01/21, Atorvastatin 80 mg 90 DS 04/30/21, Losartan 50 mg 90 DS 03/17/21  Follow-Up:Pharmacist Review   VeCharlann LangeRMA Clinical Pharmacist Assistant [phone] minutes spent in review, coordination, and documentation.  Reviewed by: ChBeverly MilchPharmD Clinical Pharmacist ([phone]

## 2021-05-23 DIAGNOSIS — J9811 Atelectasis: Secondary | ICD-10-CM | POA: Diagnosis not present

## 2021-05-23 DIAGNOSIS — J449 Chronic obstructive pulmonary disease, unspecified: Secondary | ICD-10-CM | POA: Diagnosis not present

## 2021-05-23 DIAGNOSIS — R269 Unspecified abnormalities of gait and mobility: Secondary | ICD-10-CM | POA: Diagnosis not present

## 2021-05-25 DIAGNOSIS — J449 Chronic obstructive pulmonary disease, unspecified: Secondary | ICD-10-CM | POA: Diagnosis not present

## 2021-05-25 DIAGNOSIS — J9811 Atelectasis: Secondary | ICD-10-CM | POA: Diagnosis not present

## 2021-05-25 DIAGNOSIS — R269 Unspecified abnormalities of gait and mobility: Secondary | ICD-10-CM | POA: Diagnosis not present

## 2021-05-26 ENCOUNTER — Other Ambulatory Visit: Payer: Self-pay | Admitting: Family Medicine

## 2021-06-04 ENCOUNTER — Telehealth: Payer: Self-pay | Admitting: Nurse Practitioner

## 2021-06-04 NOTE — Telephone Encounter (Signed)
Received page from patient's daughter while on call.  Reports area on patient's nose that is red and has developed a sore.  It sounds like the patient is wearing his Bipap almost all of the time which has led to a pressure sore.  Daughter was worried it may be infected.  She denied draining or oozing from the nose wound.  The sore is closed and scabbed over.  I encouraged her to have the patient seen over the weekend in urgent care if concerned for infection.  I also encouraged wearing a protective barrier like Mepilex over his nose when wearing the bipap to off load the pressure from the mask.

## 2021-06-08 DIAGNOSIS — J449 Chronic obstructive pulmonary disease, unspecified: Secondary | ICD-10-CM | POA: Diagnosis not present

## 2021-06-08 DIAGNOSIS — J9811 Atelectasis: Secondary | ICD-10-CM | POA: Diagnosis not present

## 2021-06-08 DIAGNOSIS — R269 Unspecified abnormalities of gait and mobility: Secondary | ICD-10-CM | POA: Diagnosis not present

## 2021-06-09 ENCOUNTER — Ambulatory Visit (INDEPENDENT_AMBULATORY_CARE_PROVIDER_SITE_OTHER): Payer: Medicare Other

## 2021-06-09 DIAGNOSIS — J9811 Atelectasis: Secondary | ICD-10-CM | POA: Diagnosis not present

## 2021-06-13 ENCOUNTER — Ambulatory Visit: Payer: Medicare Other | Admitting: Family Medicine

## 2021-06-16 ENCOUNTER — Ambulatory Visit: Payer: Medicare Other | Admitting: Family Medicine

## 2021-06-19 DIAGNOSIS — J449 Chronic obstructive pulmonary disease, unspecified: Secondary | ICD-10-CM | POA: Diagnosis not present

## 2021-06-19 DIAGNOSIS — J9610 Chronic respiratory failure, unspecified whether with hypoxia or hypercapnia: Secondary | ICD-10-CM | POA: Diagnosis not present

## 2021-06-20 ENCOUNTER — Other Ambulatory Visit: Payer: Self-pay

## 2021-06-20 ENCOUNTER — Other Ambulatory Visit (HOSPITAL_COMMUNITY): Payer: Self-pay

## 2021-06-20 ENCOUNTER — Encounter: Payer: Self-pay | Admitting: Family Medicine

## 2021-06-20 ENCOUNTER — Telehealth: Payer: Self-pay | Admitting: Cardiology

## 2021-06-20 ENCOUNTER — Ambulatory Visit (INDEPENDENT_AMBULATORY_CARE_PROVIDER_SITE_OTHER): Payer: Medicare Other | Admitting: Family Medicine

## 2021-06-20 VITALS — BP 102/64 | HR 72 | Temp 98.3°F | Resp 18 | Ht 67.0 in | Wt 169.0 lb

## 2021-06-20 DIAGNOSIS — L309 Dermatitis, unspecified: Secondary | ICD-10-CM | POA: Diagnosis not present

## 2021-06-20 DIAGNOSIS — R7309 Other abnormal glucose: Secondary | ICD-10-CM | POA: Diagnosis not present

## 2021-06-20 DIAGNOSIS — L989 Disorder of the skin and subcutaneous tissue, unspecified: Secondary | ICD-10-CM | POA: Diagnosis not present

## 2021-06-20 DIAGNOSIS — D485 Neoplasm of uncertain behavior of skin: Secondary | ICD-10-CM

## 2021-06-20 DIAGNOSIS — R1310 Dysphagia, unspecified: Secondary | ICD-10-CM

## 2021-06-20 DIAGNOSIS — K219 Gastro-esophageal reflux disease without esophagitis: Secondary | ICD-10-CM

## 2021-06-20 DIAGNOSIS — L281 Prurigo nodularis: Secondary | ICD-10-CM | POA: Diagnosis not present

## 2021-06-20 DIAGNOSIS — Z1329 Encounter for screening for other suspected endocrine disorder: Secondary | ICD-10-CM | POA: Diagnosis not present

## 2021-06-20 DIAGNOSIS — T17908A Unspecified foreign body in respiratory tract, part unspecified causing other injury, initial encounter: Secondary | ICD-10-CM | POA: Diagnosis not present

## 2021-06-20 DIAGNOSIS — R6889 Other general symptoms and signs: Secondary | ICD-10-CM | POA: Diagnosis not present

## 2021-06-20 MED ORDER — PANTOPRAZOLE SODIUM 40 MG PO TBEC: 40.0000 mg | DELAYED_RELEASE_TABLET | Freq: Every day | ORAL | 0 refills | Status: AC

## 2021-06-20 MED ORDER — CLONAZEPAM 0.5 MG PO TABS: ORAL_TABLET | ORAL | 1 refills | Status: AC

## 2021-06-20 NOTE — Telephone Encounter (Signed)
  1. Has your device fired? No   2. Is you device beeping? No   3. Are you experiencing draining or swelling at device site? No   4. Are you calling to see if we received your device transmission? No   5. Have you passed out? No   [name] is calling stating the night before last Todd [name]'s pacemaker shocked him, she states this has never happened to him before. She reports they relieved the monitor was unplugged when this happened so they are unsure if this episode was caught. They are wanting to know what to do in regards to it. Please advise.     Please route to [place]
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
## 2021-06-20 NOTE — Telephone Encounter (Signed)
Returned phone call. Advised patient has a pacemaker which does not having shocking features in the device.   Reports patient was sitting in the chair watching tv and reports it shocked him on 06/18/21 @ 7:30pm. Denies chest pain, acute shortness of breath or dizziness prior or after what felt like a shock. Denies any fluid retention symptoms. Reports compliance with all medications including bystolic 5 mg daily, Lasix 40 mg prn.  Remote transmission received and reviewed and shows normal device function. 2 VT events logged, appears (1:1) with duration of 2 seconds. Not a new finding and does not correlate with time if patient complaint. Patient denies any other symptoms since. Advised ED precautions and call back with any further questions or concerns.

## 2021-06-21 ENCOUNTER — Other Ambulatory Visit: Payer: Self-pay | Admitting: Family Medicine

## 2021-06-21 LAB — CBC WITH DIFFERENTIAL/PLATELET
Absolute Monocytes: 966 cells/uL — ABNORMAL HIGH (ref 200–950)
Basophils Absolute: 33 cells/uL (ref 0–200)
Basophils Relative: 0.3 %
Eosinophils Absolute: 56 cells/uL (ref 15–500)
Eosinophils Relative: 0.5 %
HCT: 36.4 % — ABNORMAL LOW (ref 38.5–50.0)
Hemoglobin: 11.1 g/dL — ABNORMAL LOW (ref 13.2–17.1)
Lymphs Abs: 1077 cells/uL (ref 850–3900)
MCH: 24.4 pg — ABNORMAL LOW (ref 27.0–33.0)
MCHC: 30.5 g/dL — ABNORMAL LOW (ref 32.0–36.0)
MCV: 80.2 fL (ref 80.0–100.0)
MPV: 10 fL (ref 7.5–12.5)
Monocytes Relative: 8.7 %
Neutro Abs: 8969 cells/uL — ABNORMAL HIGH (ref 1500–7800)
Neutrophils Relative %: 80.8 %
Platelets: 230 10*3/uL (ref 140–400)
RBC: 4.54 10*6/uL (ref 4.20–5.80)
RDW: 14.6 % (ref 11.0–15.0)
Total Lymphocyte: 9.7 %
WBC: 11.1 10*3/uL — ABNORMAL HIGH (ref 3.8–10.8)

## 2021-06-21 LAB — COMPLETE METABOLIC PANEL WITH GFR
AG Ratio: 1.5 (calc) (ref 1.0–2.5)
ALT: 10 U/L (ref 9–46)
AST: 13 U/L (ref 10–35)
Albumin: 3.7 g/dL (ref 3.6–5.1)
Alkaline phosphatase (APISO): 85 U/L (ref 35–144)
BUN/Creatinine Ratio: 18 (calc) (ref 6–22)
BUN: 11 mg/dL (ref 7–25)
CO2: 39 mmol/L — ABNORMAL HIGH (ref 20–32)
Calcium: 8.8 mg/dL (ref 8.6–10.3)
Chloride: 94 mmol/L — ABNORMAL LOW (ref 98–110)
Creat: 0.62 mg/dL — ABNORMAL LOW (ref 0.70–1.35)
Globulin: 2.4 g/dL (calc) (ref 1.9–3.7)
Glucose, Bld: 130 mg/dL — ABNORMAL HIGH (ref 65–99)
Potassium: 3.7 mmol/L (ref 3.5–5.3)
Sodium: 141 mmol/L (ref 135–146)
Total Bilirubin: 0.3 mg/dL (ref 0.2–1.2)
Total Protein: 6.1 g/dL (ref 6.1–8.1)
eGFR: 108 mL/min/{1.73_m2} (ref >=60)

## 2021-06-21 LAB — HEMOGLOBIN A1C
Hgb A1c MFr Bld: 6.1 % of total Hgb — ABNORMAL HIGH (ref <5.7)
Mean Plasma Glucose: 128 mg/dL
eAG (mmol/L): 7.1 mmol/L

## 2021-06-21 LAB — TSH: TSH: 1.32 mIU/L (ref 0.40–4.50)

## 2021-06-22 ENCOUNTER — Other Ambulatory Visit: Payer: Self-pay

## 2021-06-22 ENCOUNTER — Ambulatory Visit (HOSPITAL_COMMUNITY)
Admission: RE | Admit: 2021-06-22 | Discharge: 2021-06-22 | Disposition: A | Discharge status: Home / Self Care | Payer: Medicare Other | Source: Ambulatory Visit | Attending: Family Medicine | Admitting: Family Medicine

## 2021-06-22 ENCOUNTER — Other Ambulatory Visit: Payer: Self-pay | Admitting: *Deleted

## 2021-06-22 DIAGNOSIS — R1310 Dysphagia, unspecified: Secondary | ICD-10-CM

## 2021-06-22 DIAGNOSIS — M2578 Osteophyte, vertebrae: Secondary | ICD-10-CM | POA: Insufficient documentation

## 2021-06-22 DIAGNOSIS — R1313 Dysphagia, pharyngeal phase: Secondary | ICD-10-CM | POA: Insufficient documentation

## 2021-06-22 DIAGNOSIS — R634 Abnormal weight loss: Secondary | ICD-10-CM

## 2021-06-22 DIAGNOSIS — T17908A Unspecified foreign body in respiratory tract, part unspecified causing other injury, initial encounter: Secondary | ICD-10-CM

## 2021-06-22 DIAGNOSIS — R059 Cough, unspecified: Secondary | ICD-10-CM | POA: Diagnosis not present

## 2021-06-22 NOTE — Progress Notes (Signed)
Modified Barium Swallow Progress Note  Patient Details  Name: Todd [name] MRN: [id number] Date of Birth: 1961/11/01  Today's Date: 06/22/2021  Modified Barium Swallow completed.  Full report located under Chart Review in the Imaging Section.  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 Epiglottis did not fully invert with all swallows allowing penetration with thin and nectar consistencies. Silent aspiration during and after the swallow with thin. He attempts to cough when cues but is unable to achieve a functional cough or throat clear. Residue was min-mild. Head in a semi flexed position at rest due to arthritis and a further chin tuck was not effective. He reports weakness on right side and attempted right head turn but cervical mobility again prevented execution. There was not as significant difference with nectar thick liquids, therefore recommend continue thin. Mastication was functional but increased his fatigue and respiratory deficits. Recommend puree textures, increase protein intake, dietary consult for nutrition, pills crushed in puree. He is at increased risk for pneumonia, potential future hospitalizations given dysphagia in light of respiratory issues, weight loss and deconditioning.   Swallow Evaluation Recommendations       SLP Diet Recommendations: Dysphagia 1 (Puree) solids;Thin liquid   Liquid Administration via: Cup   Medication Administration: Crushed with puree   Supervision: Patient able to self feed   Compensations: Slow rate;Small sips/bites;Multiple dry swallows after each bite/sip;Follow solids with liquid;Clear throat intermittently;Hard cough after swallow   Postural Changes: Seated upright at 90 degrees;Remain semi-upright after after feeds/meals (Comment)   Oral Care  Recommendations: Oral care BID        Houston Siren 06/22/2021,2:01 PM  Orbie Pyo Colvin Caroli.Ed Risk analyst [phone] Office [phone]

## 2021-06-23 ENCOUNTER — Ambulatory Visit (INDEPENDENT_AMBULATORY_CARE_PROVIDER_SITE_OTHER): Payer: Medicare Other

## 2021-06-23 DIAGNOSIS — I495 Sick sinus syndrome: Secondary | ICD-10-CM

## 2021-06-23 LAB — CUP PACEART REMOTE DEVICE CHECK
Battery Remaining Longevity: 169 mo
Battery Voltage: 3.15 V
Brady Statistic AP VP Percent: 0.03 %
Brady Statistic AP VS Percent: 96.99 %
Brady Statistic AS VP Percent: 0.01 %
Brady Statistic AS VS Percent: 2.96 %
Brady Statistic RA Percent Paced: 97.09 %
Brady Statistic RV Percent Paced: 0.05 %
Date Time Interrogation Session: 20220817225656
Implantable Lead Implant Date: 20211117
Implantable Lead Implant Date: 20211117
Implantable Lead Location: 753859
Implantable Lead Location: 753860
Implantable Lead Model: 5076
Implantable Lead Model: 5076
Implantable Pulse Generator Implant Date: 20211117
Lead Channel Impedance Value: 323 Ohm
Lead Channel Impedance Value: 342 Ohm
Lead Channel Impedance Value: 380 Ohm
Lead Channel Impedance Value: 380 Ohm
Lead Channel Pacing Threshold Amplitude: 0.625 V
Lead Channel Pacing Threshold Amplitude: 0.75 V
Lead Channel Pacing Threshold Pulse Width: 0.4 ms
Lead Channel Pacing Threshold Pulse Width: 0.4 ms
Lead Channel Sensing Intrinsic Amplitude: 0.75 mV
Lead Channel Sensing Intrinsic Amplitude: 0.75 mV
Lead Channel Sensing Intrinsic Amplitude: 6.125 mV
Lead Channel Sensing Intrinsic Amplitude: 6.125 mV
Lead Channel Setting Pacing Amplitude: 1.5 V
Lead Channel Setting Pacing Amplitude: 2 V
Lead Channel Setting Pacing Pulse Width: 0.4 ms
Lead Channel Setting Sensing Sensitivity: 1.2 mV

## 2021-06-24 ENCOUNTER — Other Ambulatory Visit: Payer: Self-pay | Admitting: *Deleted

## 2021-06-24 DIAGNOSIS — Z9189 Other specified personal risk factors, not elsewhere classified: Secondary | ICD-10-CM

## 2021-06-24 DIAGNOSIS — R634 Abnormal weight loss: Secondary | ICD-10-CM

## 2021-06-24 DIAGNOSIS — K219 Gastro-esophageal reflux disease without esophagitis: Secondary | ICD-10-CM

## 2021-06-24 LAB — TISSUE SPECIMEN

## 2021-06-24 LAB — PATHOLOGY REPORT

## 2021-06-25 DIAGNOSIS — R269 Unspecified abnormalities of gait and mobility: Secondary | ICD-10-CM | POA: Diagnosis not present

## 2021-06-25 DIAGNOSIS — J449 Chronic obstructive pulmonary disease, unspecified: Secondary | ICD-10-CM | POA: Diagnosis not present

## 2021-06-25 DIAGNOSIS — J9811 Atelectasis: Secondary | ICD-10-CM | POA: Diagnosis not present

## 2021-06-29 ENCOUNTER — Other Ambulatory Visit: Payer: Self-pay | Admitting: *Deleted

## 2021-06-29 ENCOUNTER — Telehealth: Payer: Self-pay | Admitting: *Deleted

## 2021-06-29 DIAGNOSIS — K219 Gastro-esophageal reflux disease without esophagitis: Secondary | ICD-10-CM

## 2021-06-29 DIAGNOSIS — T17908A Unspecified foreign body in respiratory tract, part unspecified causing other injury, initial encounter: Secondary | ICD-10-CM

## 2021-06-29 DIAGNOSIS — R634 Abnormal weight loss: Secondary | ICD-10-CM

## 2021-06-29 DIAGNOSIS — G3184 Mild cognitive impairment, so stated: Secondary | ICD-10-CM

## 2021-06-29 DIAGNOSIS — L989 Disorder of the skin and subcutaneous tissue, unspecified: Secondary | ICD-10-CM

## 2021-06-29 DIAGNOSIS — Z9189 Other specified personal risk factors, not elsewhere classified: Secondary | ICD-10-CM

## 2021-06-29 NOTE — Telephone Encounter (Signed)
Received call from patient family.   Reports that patient is refusing thickened liquids and puree foods. States that patient does not want feeding tube either.   Inquired if referral for ST appropriate to assist with swallowing education.   Ok to place order?

## 2021-06-30 NOTE — Addendum Note (Signed)
Addended by: Sheral Flow on: 06/30/2021 08:27 AM   Modules accepted: Orders

## 2021-06-30 NOTE — Telephone Encounter (Signed)
Call placed to patient and patient made aware.   Referral orders placed.  

## 2021-07-08 NOTE — Assessment & Plan Note (Signed)
With endobronchial lesions that were cartilaginous on FOB  We will plan to repeat your Ct chest without contrast in late February to evaluate your airways Follow with Dr Lamonte Sakai in February after CT scan so that we can review the results together.

## 2021-07-08 NOTE — Assessment & Plan Note (Signed)
Please continue Trelegy 1 inhalation once daily.  Rinse and gargle after you use this. Keep your albuterol available use 2 puffs when needed for shortness of breath, chest tightness, wheezing. COVID-19 vaccine up-to-date.  Get the booster when you are able.

## 2021-07-08 NOTE — Assessment & Plan Note (Signed)
Congratulations on stopping smoking!  

## 2021-07-09 DIAGNOSIS — J449 Chronic obstructive pulmonary disease, unspecified: Secondary | ICD-10-CM | POA: Diagnosis not present

## 2021-07-09 DIAGNOSIS — R269 Unspecified abnormalities of gait and mobility: Secondary | ICD-10-CM | POA: Diagnosis not present

## 2021-07-09 DIAGNOSIS — J9811 Atelectasis: Secondary | ICD-10-CM | POA: Diagnosis not present

## 2021-07-12 ENCOUNTER — Other Ambulatory Visit: Payer: Self-pay | Admitting: Family Medicine

## 2021-07-12 NOTE — Progress Notes (Signed)
Remote pacemaker transmission.   

## 2021-07-13 DIAGNOSIS — E1159 Type 2 diabetes mellitus with other circulatory complications: Secondary | ICD-10-CM | POA: Diagnosis not present

## 2021-07-13 DIAGNOSIS — I5032 Chronic diastolic (congestive) heart failure: Secondary | ICD-10-CM | POA: Diagnosis not present

## 2021-07-13 DIAGNOSIS — J9622 Acute and chronic respiratory failure with hypercapnia: Secondary | ICD-10-CM | POA: Diagnosis not present

## 2021-07-13 DIAGNOSIS — K219 Gastro-esophageal reflux disease without esophagitis: Secondary | ICD-10-CM | POA: Diagnosis not present

## 2021-07-13 DIAGNOSIS — J449 Chronic obstructive pulmonary disease, unspecified: Secondary | ICD-10-CM | POA: Diagnosis not present

## 2021-07-13 DIAGNOSIS — J690 Pneumonitis due to inhalation of food and vomit: Secondary | ICD-10-CM | POA: Diagnosis not present

## 2021-07-18 ENCOUNTER — Emergency Department (HOSPITAL_COMMUNITY)
Admission: EM | Admit: 2021-07-18 | Discharge: 2021-07-18 | Disposition: A | Discharge status: Home / Self Care | Payer: Medicare Other | Attending: Emergency Medicine | Admitting: Emergency Medicine

## 2021-07-18 ENCOUNTER — Other Ambulatory Visit: Payer: Self-pay

## 2021-07-18 ENCOUNTER — Encounter (HOSPITAL_COMMUNITY): Payer: Self-pay | Admitting: Emergency Medicine

## 2021-07-18 ENCOUNTER — Emergency Department (HOSPITAL_COMMUNITY): Payer: Medicare Other

## 2021-07-18 DIAGNOSIS — Z5321 Procedure and treatment not carried out due to patient leaving prior to being seen by health care provider: Secondary | ICD-10-CM | POA: Diagnosis not present

## 2021-07-18 DIAGNOSIS — R410 Disorientation, unspecified: Secondary | ICD-10-CM | POA: Diagnosis not present

## 2021-07-18 DIAGNOSIS — R4182 Altered mental status, unspecified: Secondary | ICD-10-CM | POA: Insufficient documentation

## 2021-07-18 DIAGNOSIS — I4891 Unspecified atrial fibrillation: Secondary | ICD-10-CM | POA: Diagnosis not present

## 2021-07-18 DIAGNOSIS — R0602 Shortness of breath: Secondary | ICD-10-CM | POA: Insufficient documentation

## 2021-07-18 LAB — I-STAT VENOUS BLOOD GAS, ED
Acid-Base Excess: 13 mmol/L — ABNORMAL HIGH (ref 0.0–2.0)
Bicarbonate: 40.7 mmol/L — ABNORMAL HIGH (ref 20.0–28.0)
Calcium, Ion: 1.02 mmol/L — ABNORMAL LOW (ref 1.15–1.40)
HCT: 40 % (ref 39.0–52.0)
Hemoglobin: 13.6 g/dL (ref 13.0–17.0)
O2 Saturation: 98 %
Potassium: 3.8 mmol/L (ref 3.5–5.1)
Sodium: 136 mmol/L (ref 135–145)
TCO2: 43 mmol/L — ABNORMAL HIGH (ref 22–32)
pCO2, Ven: 67.4 mmHg — ABNORMAL HIGH (ref 44.0–60.0)
pH, Ven: 7.389 (ref 7.250–7.430)
pO2, Ven: 113 mmHg — ABNORMAL HIGH (ref 32.0–45.0)

## 2021-07-18 LAB — CBC WITH DIFFERENTIAL/PLATELET
Abs Immature Granulocytes: 0.02 10*3/uL (ref 0.00–0.07)
Basophils Absolute: 0 10*3/uL (ref 0.0–0.1)
Basophils Relative: 1 %
Eosinophils Absolute: 0 10*3/uL (ref 0.0–0.5)
Eosinophils Relative: 1 %
HCT: 41.6 % (ref 39.0–52.0)
Hemoglobin: 11.7 g/dL — ABNORMAL LOW (ref 13.0–17.0)
Immature Granulocytes: 0 %
Lymphocytes Relative: 14 %
Lymphs Abs: 1.2 10*3/uL (ref 0.7–4.0)
MCH: 24.1 pg — ABNORMAL LOW (ref 26.0–34.0)
MCHC: 28.1 g/dL — ABNORMAL LOW (ref 30.0–36.0)
MCV: 85.6 fL (ref 80.0–100.0)
Monocytes Absolute: 0.8 10*3/uL (ref 0.1–1.0)
Monocytes Relative: 10 %
Neutro Abs: 6.6 10*3/uL (ref 1.7–7.7)
Neutrophils Relative %: 74 %
Platelets: 221 10*3/uL (ref 150–400)
RBC: 4.86 MIL/uL (ref 4.22–5.81)
RDW: 16.5 % — ABNORMAL HIGH (ref 11.5–15.5)
WBC: 8.7 10*3/uL (ref 4.0–10.5)
nRBC: 0 % (ref 0.0–0.2)

## 2021-07-18 LAB — COMPREHENSIVE METABOLIC PANEL
ALT: 15 U/L (ref 0–44)
AST: 23 U/L (ref 15–41)
Albumin: 3.5 g/dL (ref 3.5–5.0)
Alkaline Phosphatase: 79 U/L (ref 38–126)
Anion gap: 14 (ref 5–15)
BUN: 12 mg/dL (ref 8–23)
CO2: 37 mmol/L — ABNORMAL HIGH (ref 22–32)
Calcium: 9 mg/dL (ref 8.9–10.3)
Chloride: 86 mmol/L — ABNORMAL LOW (ref 98–111)
Creatinine, Ser: 0.62 mg/dL (ref 0.61–1.24)
GFR, Estimated: >60 mL/min (ref >60)
Glucose, Bld: 133 mg/dL — ABNORMAL HIGH (ref 70–99)
Potassium: 3.8 mmol/L (ref 3.5–5.1)
Sodium: 137 mmol/L (ref 135–145)
Total Bilirubin: 0.4 mg/dL (ref 0.3–1.2)
Total Protein: 6.7 g/dL (ref 6.5–8.1)

## 2021-07-18 LAB — BRAIN NATRIURETIC PEPTIDE: B Natriuretic Peptide: 42.8 pg/mL (ref 0.0–100.0)

## 2021-07-18 NOTE — ED Provider Notes (Signed)
Emergency Medicine Provider Triage Evaluation Note  HUY KRIES , a 60 y.o. male  was evaluated in triage.    Patient with multiple comorbidities.  He is presents with his son-in-law.  He had worsening confusion over the past several days.  Son notes several problems that he is having including right lower foot being colder than normal.  He has a new cough that is developed.  Shortness of breath has been worsening.  He has problems with chronic aspiration and does have COPD.  He uses his home BiPAP all the time causing a wound on his nose.  He is also had recent weight loss and chills at home.  They are concerned for CO2 retention.  Son has a very long list in the room of several problems that they want to get checked out.  Review of Systems  Positive: Chills, cough, shortness of breath, altered mental status Negative: Chest pain, abdominal pain, nausea, vomiting  Physical Exam  BP (!) 98/39 (BP Location: Left Arm)   Pulse 65   Temp (!) 97.3 F (36.3 C) (Axillary)   Resp 16   SpO2 100%  Gen:   Awake, no distress.  Alert and oriented x3. Resp:  Normal effort.  Lungs clear to auscultation. MSK:   Moves extremities without difficulty Other:  Right lower leg with 2+ pulse.  No obvious wound or deformity noted.  Medical Decision Making  Medically screening exam initiated at 9:38 PM.  Appropriate orders placed.  Jessy Oto Salay was informed that the remainder of the evaluation will be completed by another provider, this initial triage assessment does not replace that evaluation, and the importance of remaining in the ED until their evaluation is complete.   Sheila Oats 07/18/21 [id number]    Dorie Rank, MD 07/20/21 [phone]

## 2021-07-18 NOTE — ED Triage Notes (Signed)
Patient with increased confusion.  Patient does have problems with aspiration and does have COPD.  Patient is using his home bipap all the time, causing wound on his nose.  Patient is confused to time and date.  Patient does know where he is.  Patient having some shortness of breath.

## 2021-07-19 ENCOUNTER — Telehealth: Payer: Self-pay | Admitting: *Deleted

## 2021-07-19 DIAGNOSIS — K219 Gastro-esophageal reflux disease without esophagitis: Secondary | ICD-10-CM | POA: Diagnosis not present

## 2021-07-19 DIAGNOSIS — J9622 Acute and chronic respiratory failure with hypercapnia: Secondary | ICD-10-CM | POA: Diagnosis not present

## 2021-07-19 DIAGNOSIS — J690 Pneumonitis due to inhalation of food and vomit: Secondary | ICD-10-CM | POA: Diagnosis not present

## 2021-07-19 DIAGNOSIS — E1159 Type 2 diabetes mellitus with other circulatory complications: Secondary | ICD-10-CM | POA: Diagnosis not present

## 2021-07-19 DIAGNOSIS — J449 Chronic obstructive pulmonary disease, unspecified: Secondary | ICD-10-CM | POA: Diagnosis not present

## 2021-07-19 DIAGNOSIS — I5032 Chronic diastolic (congestive) heart failure: Secondary | ICD-10-CM | POA: Diagnosis not present

## 2021-07-19 NOTE — Telephone Encounter (Signed)
Received call from patient daughter, [name].   Reports that patient BP noted at 82/50. States that patient is very lethargic and does have some AMS.   States that patient left ER last night after 5 hours AMA as his oxygen tank had run out.   PCP made aware and recommendations are as follows: Hold Bystolic, Losartan, and Lasix  If Sx worsening, go back to ER If Sx stable, keep appointment on Thursday.   Verbalized understanding. Agreeable to plan.

## 2021-07-20 ENCOUNTER — Telehealth: Payer: Self-pay | Admitting: *Deleted

## 2021-07-20 DIAGNOSIS — J449 Chronic obstructive pulmonary disease, unspecified: Secondary | ICD-10-CM | POA: Diagnosis not present

## 2021-07-20 DIAGNOSIS — J9610 Chronic respiratory failure, unspecified whether with hypoxia or hypercapnia: Secondary | ICD-10-CM | POA: Diagnosis not present

## 2021-07-20 NOTE — Telephone Encounter (Signed)
Received call from [place], [facility] PT with [facility]   Requested order for [facility] PT 1x weekly x2 weeks, 2x weekly x4 weeks, 1x weekly x1 week for strengthening, gait, balance, home exercise program, and safety.   VO given.   Also reports that patient BP noted low on 07/19/2021. Advised that PCP has been made aware and recommendations were given to family to hold all BP/ fluid meds until seen.

## 2021-07-21 ENCOUNTER — Other Ambulatory Visit: Payer: Self-pay

## 2021-07-21 ENCOUNTER — Encounter: Payer: Self-pay | Admitting: Family Medicine

## 2021-07-21 ENCOUNTER — Ambulatory Visit (INDEPENDENT_AMBULATORY_CARE_PROVIDER_SITE_OTHER): Payer: Medicare Other | Admitting: Family Medicine

## 2021-07-21 ENCOUNTER — Telehealth: Payer: Self-pay

## 2021-07-21 VITALS — BP 118/60 | HR 98 | Temp 98.1°F | Resp 16 | Ht 67.0 in | Wt 164.0 lb

## 2021-07-21 DIAGNOSIS — T17908A Unspecified foreign body in respiratory tract, part unspecified causing other injury, initial encounter: Secondary | ICD-10-CM | POA: Diagnosis not present

## 2021-07-21 DIAGNOSIS — R634 Abnormal weight loss: Secondary | ICD-10-CM | POA: Diagnosis not present

## 2021-07-21 DIAGNOSIS — R627 Adult failure to thrive: Secondary | ICD-10-CM | POA: Diagnosis not present

## 2021-07-21 DIAGNOSIS — J9612 Chronic respiratory failure with hypercapnia: Secondary | ICD-10-CM

## 2021-07-21 DIAGNOSIS — J449 Chronic obstructive pulmonary disease, unspecified: Secondary | ICD-10-CM | POA: Diagnosis not present

## 2021-07-21 DIAGNOSIS — I5032 Chronic diastolic (congestive) heart failure: Secondary | ICD-10-CM

## 2021-07-21 LAB — COMPLETE METABOLIC PANEL WITH GFR
AG Ratio: 1.6 (calc) (ref 1.0–2.5)
ALT: 13 U/L (ref 9–46)
AST: 17 U/L (ref 10–35)
Albumin: 4 g/dL (ref 3.6–5.1)
Alkaline phosphatase (APISO): 84 U/L (ref 35–144)
BUN/Creatinine Ratio: 22 (calc) (ref 6–22)
BUN: 15 mg/dL (ref 7–25)
CO2: 42 mmol/L — ABNORMAL HIGH (ref 20–32)
Calcium: 9.6 mg/dL (ref 8.6–10.3)
Chloride: 92 mmol/L — ABNORMAL LOW (ref 98–110)
Creat: 0.67 mg/dL — ABNORMAL LOW (ref 0.70–1.35)
Globulin: 2.5 g/dL (calc) (ref 1.9–3.7)
Glucose, Bld: 128 mg/dL — ABNORMAL HIGH (ref 65–99)
Potassium: 3.8 mmol/L (ref 3.5–5.3)
Sodium: 139 mmol/L (ref 135–146)
Total Bilirubin: 0.4 mg/dL (ref 0.2–1.2)
Total Protein: 6.5 g/dL (ref 6.1–8.1)
eGFR: 106 mL/min/{1.73_m2} (ref >=60)

## 2021-07-21 LAB — CBC WITH DIFFERENTIAL/PLATELET
Absolute Monocytes: 923 cells/uL (ref 200–950)
Basophils Absolute: 34 cells/uL (ref 0–200)
Basophils Relative: 0.3 %
Eosinophils Absolute: 46 cells/uL (ref 15–500)
Eosinophils Relative: 0.4 %
HCT: 39 % (ref 38.5–50.0)
Hemoglobin: 11.4 g/dL — ABNORMAL LOW (ref 13.2–17.1)
Lymphs Abs: 878 cells/uL (ref 850–3900)
MCH: 23.8 pg — ABNORMAL LOW (ref 27.0–33.0)
MCHC: 29.2 g/dL — ABNORMAL LOW (ref 32.0–36.0)
MCV: 81.3 fL (ref 80.0–100.0)
MPV: 10.6 fL (ref 7.5–12.5)
Monocytes Relative: 8.1 %
Neutro Abs: 9519 cells/uL — ABNORMAL HIGH (ref 1500–7800)
Neutrophils Relative %: 83.5 %
Platelets: 200 10*3/uL (ref 140–400)
RBC: 4.8 10*6/uL (ref 4.20–5.80)
RDW: 15.1 % — ABNORMAL HIGH (ref 11.0–15.0)
Total Lymphocyte: 7.7 %
WBC: 11.4 10*3/uL — ABNORMAL HIGH (ref 3.8–10.8)
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
## 2021-07-22 LAB — URINALYSIS, ROUTINE W REFLEX MICROSCOPIC
Bacteria, UA: NONE SEEN /HPF
Bilirubin Urine: NEGATIVE
Hgb urine dipstick: NEGATIVE
Hyaline Cast: NONE SEEN /LPF
Ketones, ur: NEGATIVE
Leukocytes,Ua: NEGATIVE
Nitrite: NEGATIVE
RBC / HPF: NONE SEEN /HPF (ref 0–2)
Specific Gravity, Urine: 1.039 — ABNORMAL HIGH (ref 1.001–1.035)
Squamous Epithelial / HPF: NONE SEEN /HPF (ref <=5)
WBC, UA: NONE SEEN /HPF (ref 0–5)
pH: 6.5 (ref 5.0–8.0)

## 2021-07-22 LAB — MICROSCOPIC MESSAGE

## 2021-07-26 ENCOUNTER — Telehealth: Payer: Self-pay | Admitting: Family Medicine

## 2021-07-26 ENCOUNTER — Telehealth: Payer: Self-pay

## 2021-07-26 DIAGNOSIS — R269 Unspecified abnormalities of gait and mobility: Secondary | ICD-10-CM | POA: Diagnosis not present

## 2021-07-26 DIAGNOSIS — R627 Adult failure to thrive: Secondary | ICD-10-CM

## 2021-07-26 DIAGNOSIS — J449 Chronic obstructive pulmonary disease, unspecified: Secondary | ICD-10-CM | POA: Diagnosis not present

## 2021-07-26 DIAGNOSIS — J9811 Atelectasis: Secondary | ICD-10-CM | POA: Diagnosis not present

## 2021-07-26 NOTE — Telephone Encounter (Signed)
[facility]  nurse called for verbal okay elev.and treatment  for pt . Gave okay for this to [employer] to [facility] .

## 2021-07-26 NOTE — Telephone Encounter (Signed)
Received call from patient's daughter [name] to report patient's decision regarding a feeding tube; patient has decided to continue eating and drinking as he's been to preserve his current quality of life. Patient requesting a hospice referral as discussed with provider for comfort measures.   Please advise at [phone]

## 2021-07-26 NOTE — Telephone Encounter (Signed)
Received call from Judson Roch, [place] with [facility].   Reports that patient missed visits on 07/20/2021 and 07/22/2021 due to outside appointments. States that visit was completed today and patient is not appropriate for services. ST services to be D/C'ed.   Ol to place hospice referral as per family request?

## 2021-07-27 ENCOUNTER — Telehealth: Payer: Self-pay

## 2021-07-27 DIAGNOSIS — J9622 Acute and chronic respiratory failure with hypercapnia: Secondary | ICD-10-CM | POA: Diagnosis not present

## 2021-07-27 DIAGNOSIS — J690 Pneumonitis due to inhalation of food and vomit: Secondary | ICD-10-CM | POA: Diagnosis not present

## 2021-07-27 DIAGNOSIS — K219 Gastro-esophageal reflux disease without esophagitis: Secondary | ICD-10-CM | POA: Diagnosis not present

## 2021-07-27 DIAGNOSIS — E1159 Type 2 diabetes mellitus with other circulatory complications: Secondary | ICD-10-CM | POA: Diagnosis not present

## 2021-07-27 DIAGNOSIS — J449 Chronic obstructive pulmonary disease, unspecified: Secondary | ICD-10-CM | POA: Diagnosis not present

## 2021-07-27 DIAGNOSIS — I5032 Chronic diastolic (congestive) heart failure: Secondary | ICD-10-CM | POA: Diagnosis not present

## 2021-07-27 NOTE — Telephone Encounter (Signed)
Pt's daughter called in asking if pt should take BP meds due to a high bp reading taken by pt's PT. Pt's daughter is aware that pt is being referred to hospice and pcp has stopped bp meds, but pt was concerned about the bp being high. Please advise.   Cb#: [phone]

## 2021-07-27 NOTE — Telephone Encounter (Signed)
Referral orders placed

## 2021-07-27 NOTE — Telephone Encounter (Signed)
Call placed to patient  daughter.   Reports that [facility] PT was out to see patient today and noted BP at 142/ 110 at rest. Patient noted asymptomatic.   Also reports that family has decreased Klonopin as directed, but patient noted to have increased anxiety. Requested to resume Klonopin BID.   Please advise.

## 2021-07-28 NOTE — Telephone Encounter (Signed)
Call placed to patient and patient daughter [name] made aware.

## 2021-07-29 ENCOUNTER — Telehealth: Payer: Self-pay | Admitting: *Deleted

## 2021-07-29 NOTE — Telephone Encounter (Signed)
Received call from [place], referral intake for [facility].   Reports that referral has been received and patient is to have evaluation today.   Inquired if PCP will remain primary.   Advised PCP will remain primary with hospice medical director to manage emergent issues or pain management.   Also inquired if PCP attests that in MD opinion, patient has <6 months expectancy. Advised that PCP agrees.

## 2021-08-04 ENCOUNTER — Telehealth: Payer: Self-pay

## 2021-08-04 NOTE — Telephone Encounter (Signed)
Pt came in needing FMLA ppw filled out for this pt, intake form completed and placed in nurse's folder. Please call when available for pick up.  Cb#: [phone]

## 2021-08-09 DIAGNOSIS — K219 Gastro-esophageal reflux disease without esophagitis: Secondary | ICD-10-CM | POA: Diagnosis not present

## 2021-08-09 DIAGNOSIS — E1169 Type 2 diabetes mellitus with other specified complication: Secondary | ICD-10-CM | POA: Diagnosis not present

## 2021-08-09 DIAGNOSIS — J9622 Acute and chronic respiratory failure with hypercapnia: Secondary | ICD-10-CM | POA: Diagnosis not present

## 2021-08-09 DIAGNOSIS — I5032 Chronic diastolic (congestive) heart failure: Secondary | ICD-10-CM | POA: Diagnosis not present

## 2021-08-09 DIAGNOSIS — J690 Pneumonitis due to inhalation of food and vomit: Secondary | ICD-10-CM | POA: Diagnosis not present

## 2021-08-09 DIAGNOSIS — J449 Chronic obstructive pulmonary disease, unspecified: Secondary | ICD-10-CM | POA: Diagnosis not present

## 2021-08-09 NOTE — Telephone Encounter (Signed)
Received FMLA forms for patient daughter Cornell Barman.  Call placed to patient daughter for more information.   Job title: [employer]/ [employer] Duties: scheduling appointments, charting, training, walking standing Hours of Work: TWFS, 40hrs   Reason FMLA requested: patient under Hospice Care- admitted 07/26/2021.  Requested Beginning Date: continuous leave for 07/02/2021- 07/13/2021, 07/23/2021, 07/26/2021, 08/02/2021- 08/03/2021 Requested reduced schedule for FS only- Hospice CNA in home on T/Th, Hospice nurse in home QD  Verbalized that fee may be charged and is per provider prerogative.   Of note, form was completed by family. Requested blank copy for PCP to complete and received personal illness FMLA, not Family Care. Requested correct forms for completion.

## 2021-08-10 ENCOUNTER — Telehealth: Payer: Self-pay | Admitting: Family Medicine

## 2021-08-10 NOTE — Telephone Encounter (Signed)
Received FMLA forms for [employer].   Call placed to patient daughter for more information.   Job title: [employer] Duties: homecare for terminally ill patient's Hours of Work: M-F 40hrs/ week  Reason FMLA requested: patient decline, currently on Hospice  Requested Beginning Date: 08/10/2021- 09/27/2021 Continuous leave   Verbalized that fee may be charged and is per provider prerogative.   Forms routed to provider.

## 2021-08-10 NOTE — Telephone Encounter (Signed)
Patient daughter requesting to add 08/12/2021- 08/13/2021 for leave.

## 2021-08-10 NOTE — Telephone Encounter (Signed)
Pts daughter [name]) dropped off FMLA forms to be completed by the provider and would like to see if they can be filled out as soon as possible was advised it may take up to 3-5 business day daughter stated it is time sensative due to the pt being in Hospice.  Upon completion daughter would like to be called to pick it up.  Placed in providers folder for completion.

## 2021-08-10 NOTE — Telephone Encounter (Signed)
Received correct forms to complete.   Routed to PCP.

## 2021-08-17 ENCOUNTER — Telehealth: Payer: Self-pay | Admitting: *Deleted

## 2021-09-06 NOTE — Telephone Encounter (Signed)
Received call from [facility] with [facility].   Reports that patient passed on 2021/08/22 @ 11:44am.

## 2021-09-06 DEATH — deceased

## 2021-10-06 ENCOUNTER — Telehealth: Payer: Medicare Other

## 2021-10-15 IMAGING — DX DG CHEST 1V PORT
1 series · 1 of 1 positions shown · non-contrast
Comparison: 07/23/2020
COMPARISON: 07/23/2020

Addendum:
CLINICAL DATA: Respiratory abnormality

EXAM:
PORTABLE CHEST 1 VIEW

[chest]
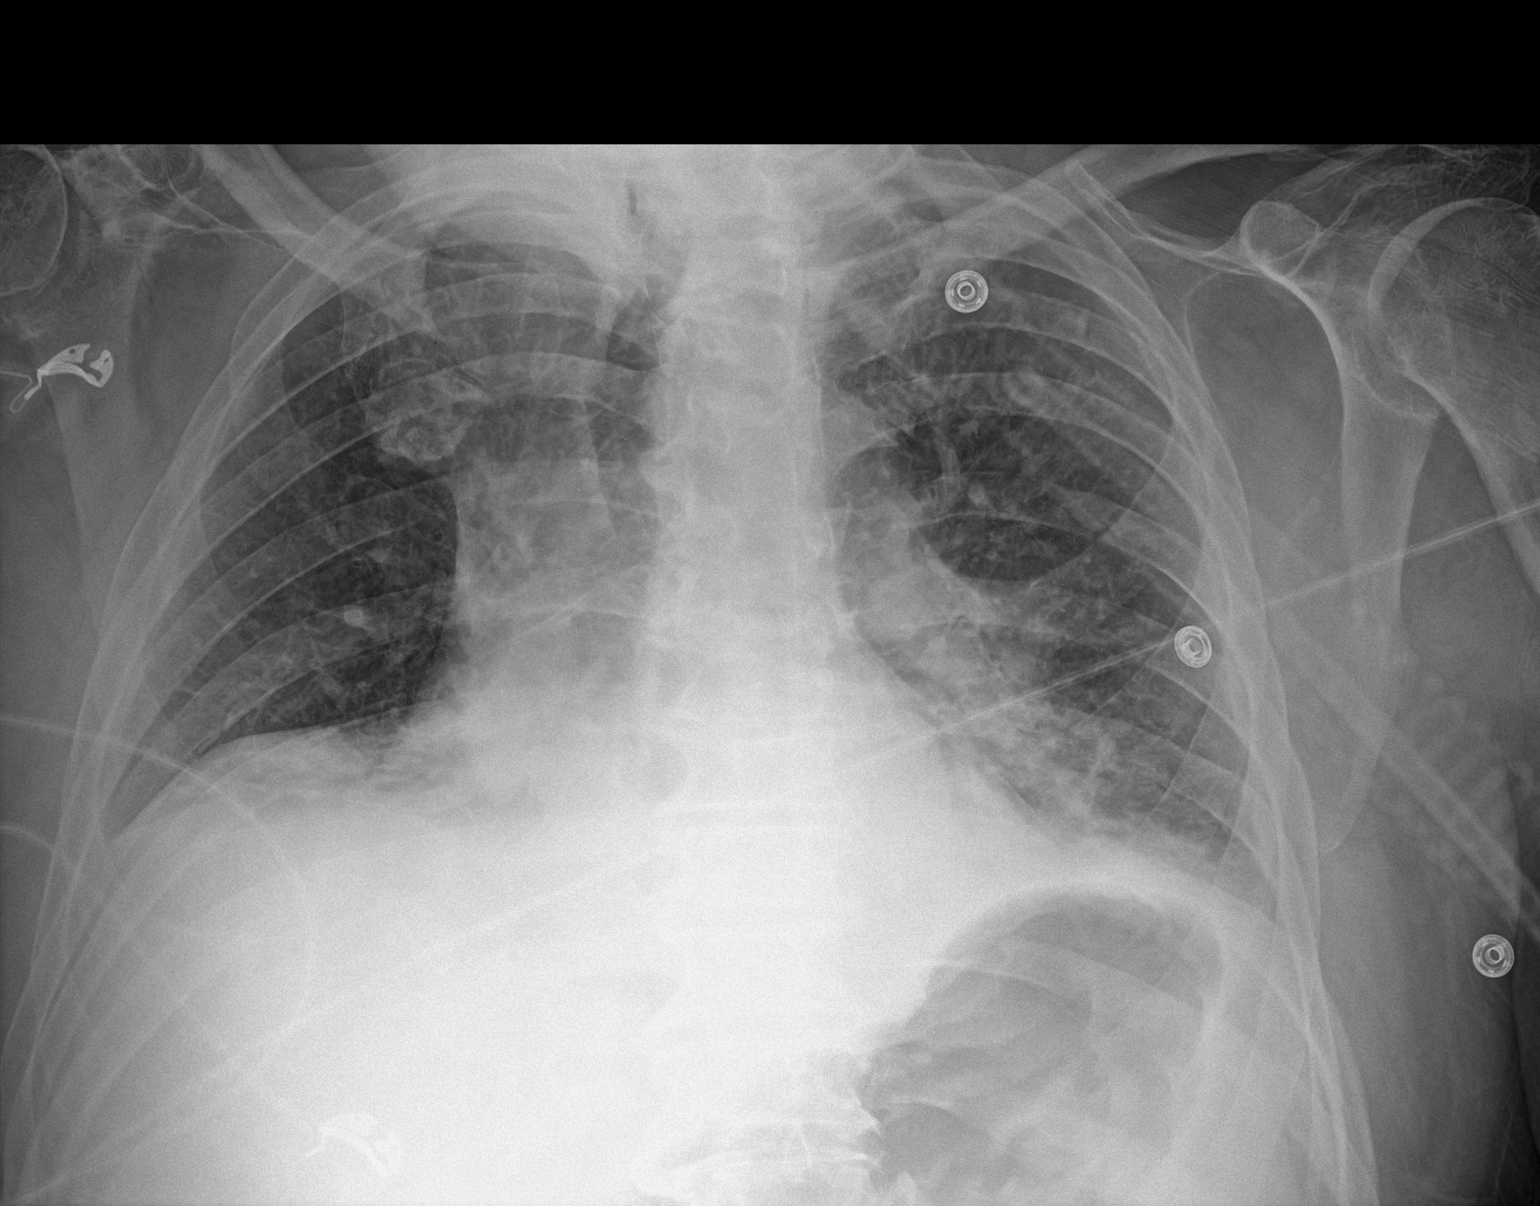

[1 of 1 positions shown; findings below may reference images not displayed]

FINDINGS: None lung volumes are small, but are symmetric. Pulmonary
insufflation has decreased since prior examination and there has
developed bibasilar atelectasis. No superimposed confluent pulmonary
infiltrate. No pneumothorax or pleural effusion. Cardiac size within
normal limits. No acute bone abnormality.
IMPRESSION: Progressive pulmonary hypoinflation with bibasilar atelectasis.

ADDENDUM:
Images are reportedly relabeled and the exam is resubmitted. No
change to the original interpretation.

*** End of Addendum ***
FINDINGS: None lung volumes are small, but are symmetric. Pulmonary
insufflation has decreased since prior examination and there has
developed bibasilar atelectasis. No superimposed confluent pulmonary
infiltrate. No pneumothorax or pleural effusion. Cardiac size within
normal limits. No acute bone abnormality.
IMPRESSION: Progressive pulmonary hypoinflation with bibasilar atelectasis.

## 2021-11-09 IMAGING — CT CT HEAD W/O CM
4 series · 16 of 47 positions shown, 18 images · non-contrast
Comparison: None.

CLINICAL DATA: Syncope.

EXAM:
CT HEAD WITHOUT CONTRAST
TECHNIQUE: Contiguous axial images were obtained from the base of the skull
through the vertex without intravenous contrast.

[Series 6: head wo · axial · 0.51mm/px · z∈[+1172,+1292]mm · 7 of 34 slices shown, 9 images]
[im 5/34  brain]
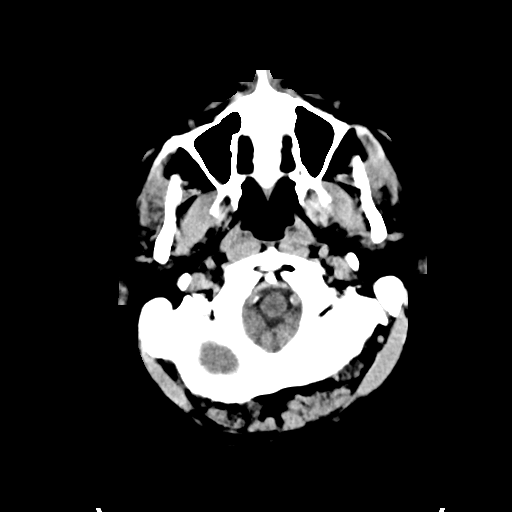
[im 5/34  bone]
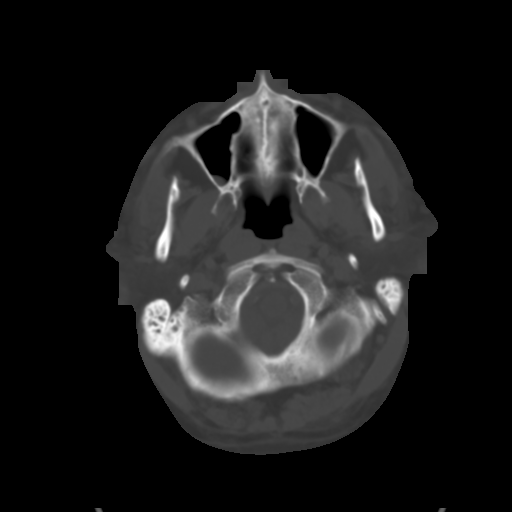
[im 9/34  brain]
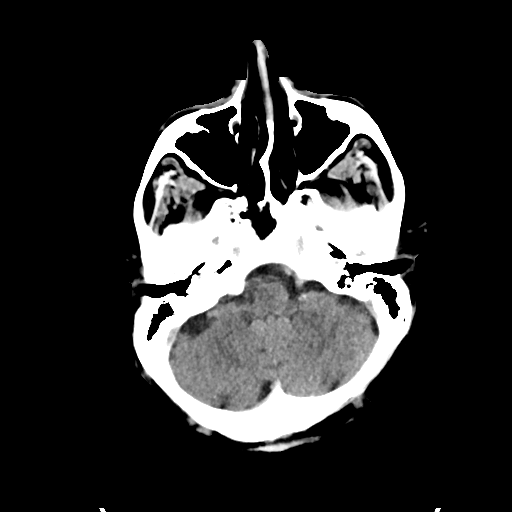
[im 13/34  brain]
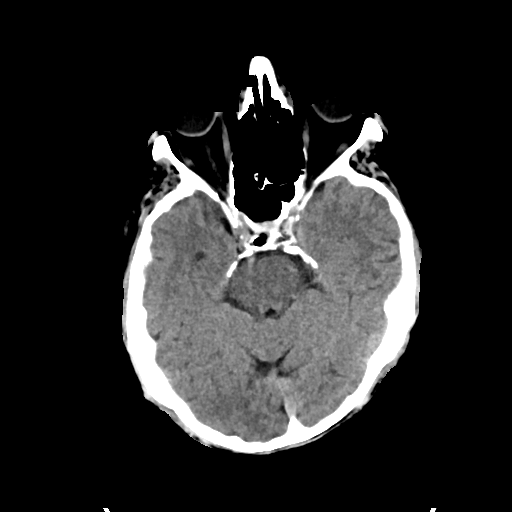
[im 17/34  brain]
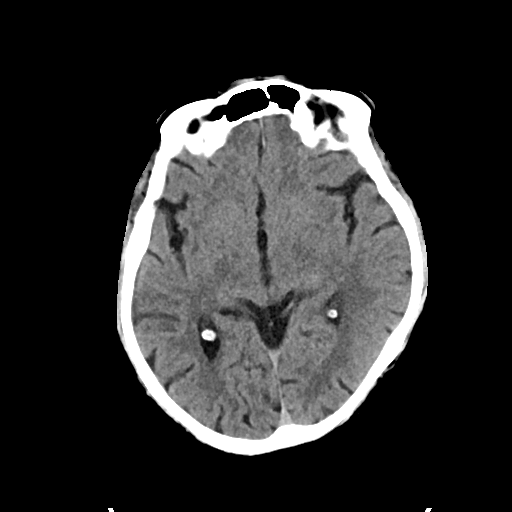
[im 21/34  brain]
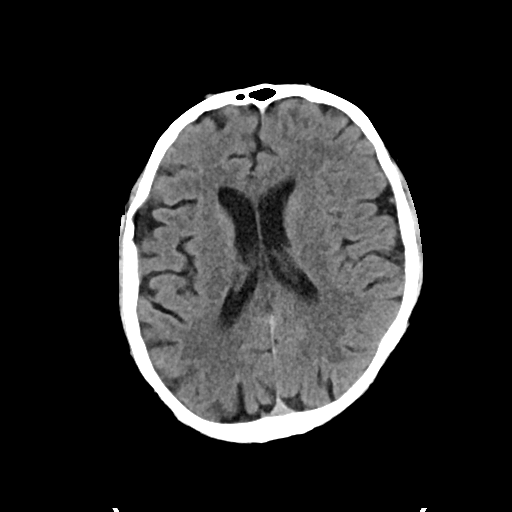
[im 21/34  bone]
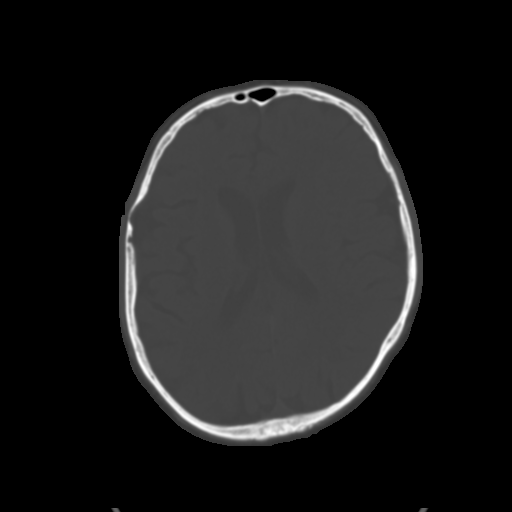
[im 25/34  brain]
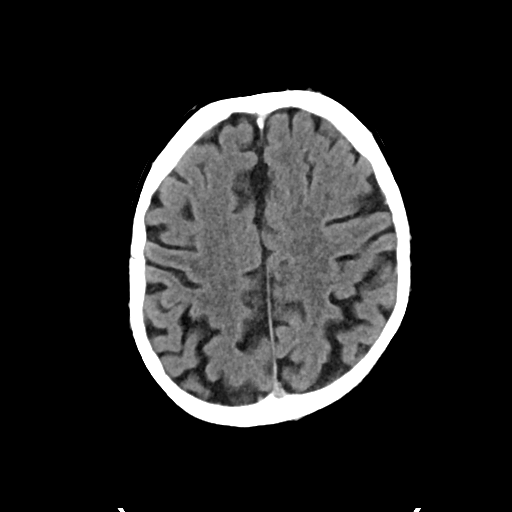
[im 29/34  brain]
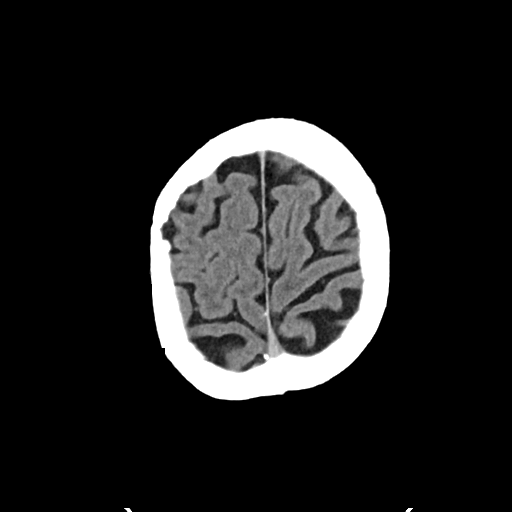

[Series 7: head bone · axial · 0.51mm/px · z∈[+1168,+1202]mm · 3 of 85 slices shown]
[im 9/85  bone]
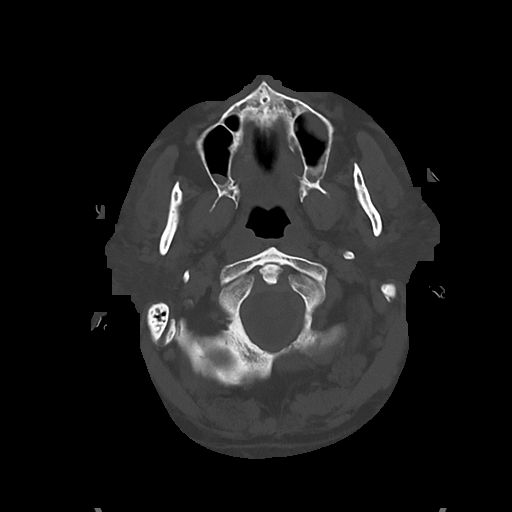
[im 17/85  bone]
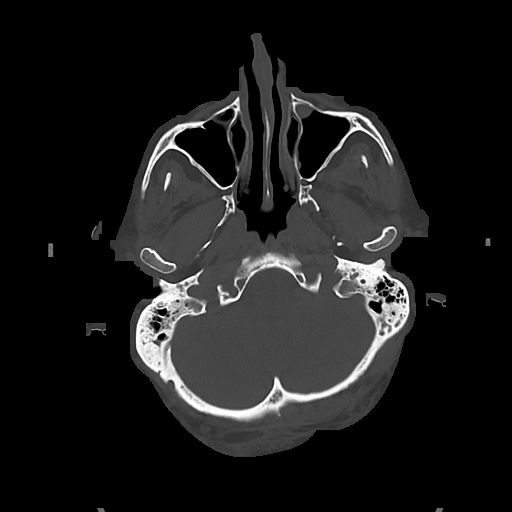
[im 26/85  bone]
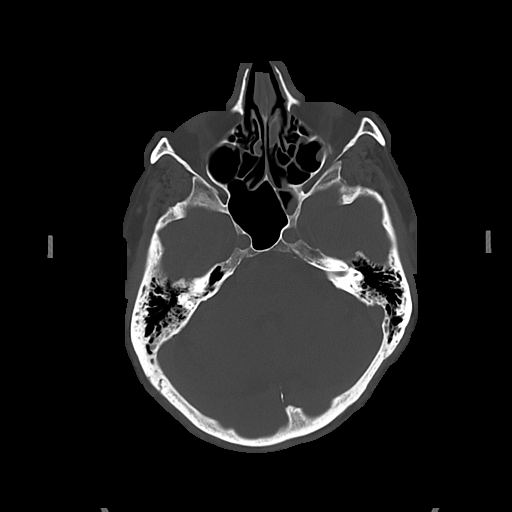

[Series 8: cor soft · coronal · 0.36mm/px · 3 of 83 slices shown]
[im 28/83  brain]
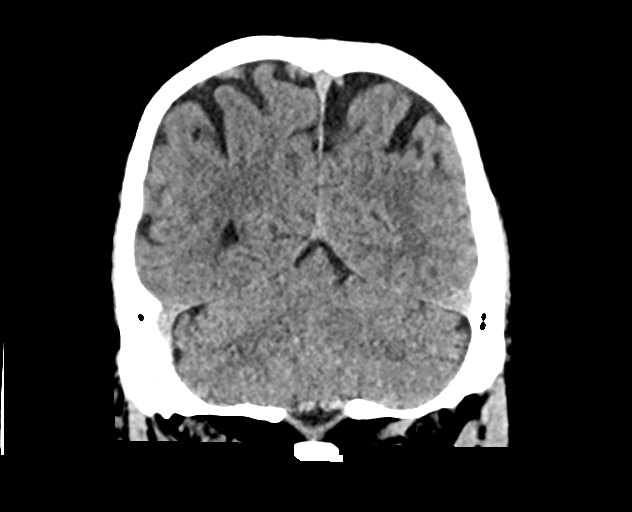
[im 37/83  brain]
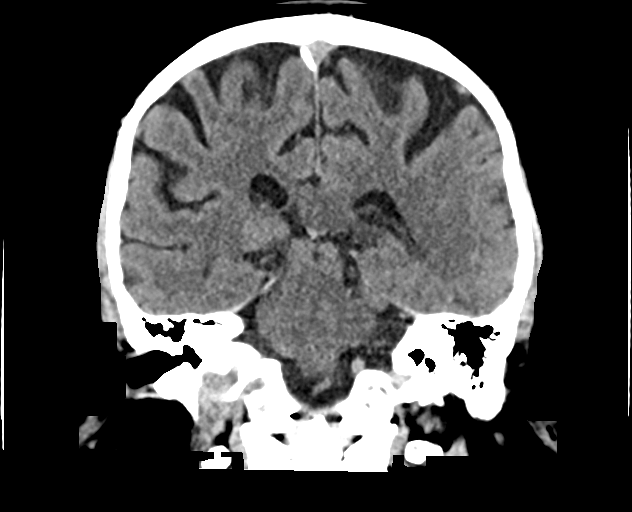
[im 46/83  brain]
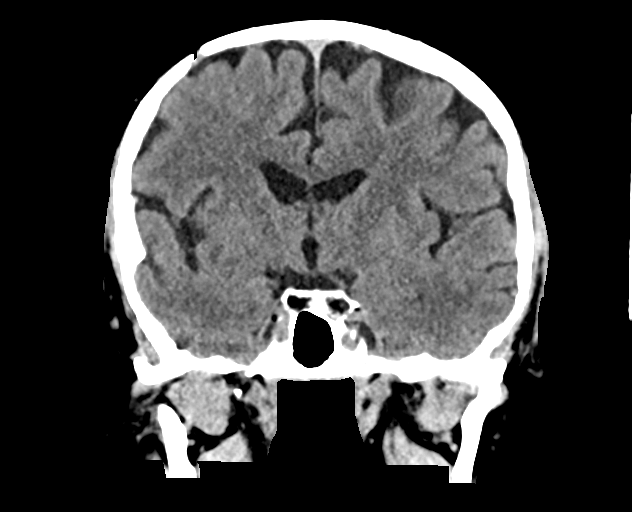

[Series 9: sag soft · sagittal · 0.36mm/px · 3 of 76 slices shown]
[im 26/76  brain]
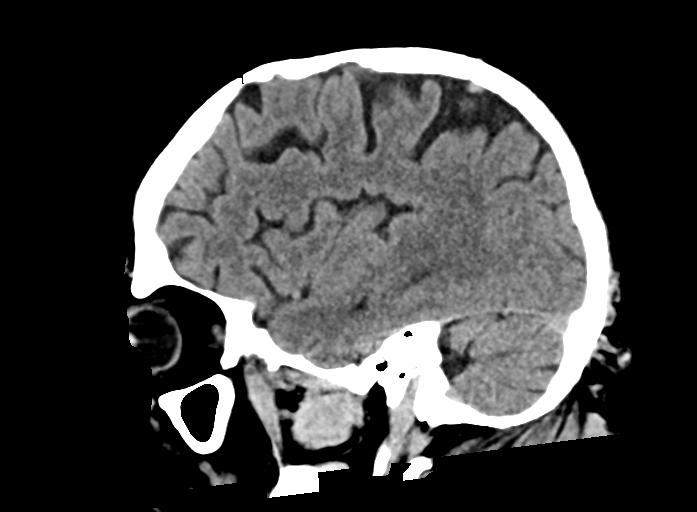
[im 38/76  brain]
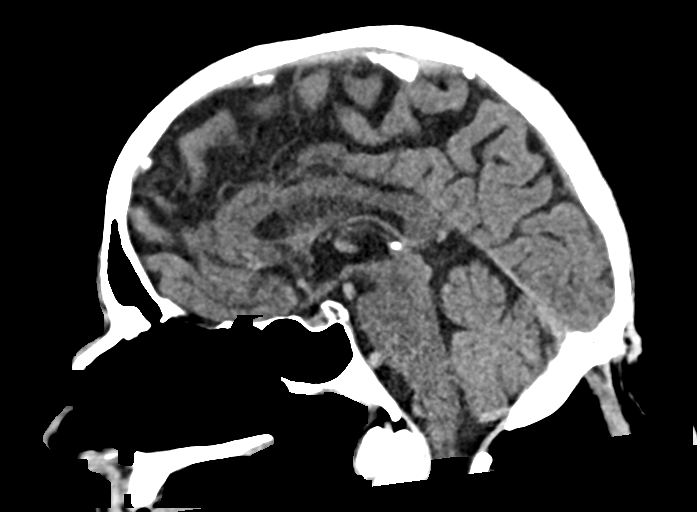
[im 51/76  brain]
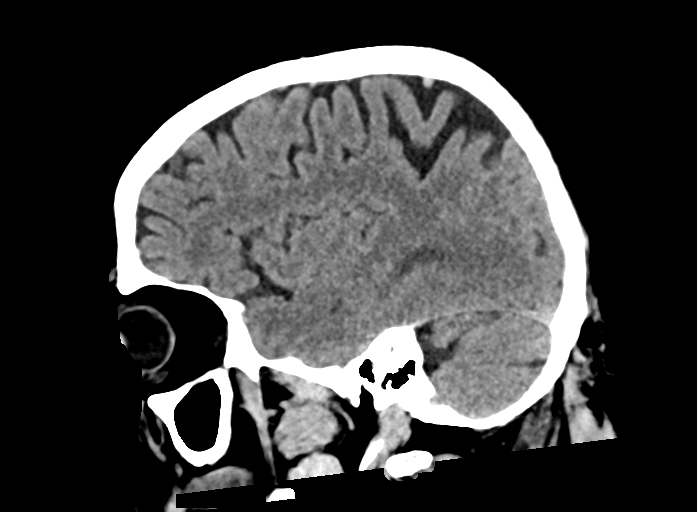

[16 of 47 positions shown; findings below may reference images not displayed]

FINDINGS: Brain: No evidence of acute infarction, hemorrhage, hydrocephalus,
extra-axial collection or mass lesion/mass effect. Mild patchy white
matter hypodensities, most likely related to chronic microvascular
ischemic disease.

Vascular: Calcific atherosclerosis.

Skull: No acute fracture.

Sinuses/Orbits: Mild scattered paranasal sinus mucosal thickening.
Retention cyst in bilateral maxillary sinuses. Unremarkable orbits.

Other: Small right mastoid effusion.
IMPRESSION: No evidence of acute intracranial abnormality.

## 2021-11-12 IMAGING — CR DG CHEST 2V
2 series · 2 of 2 positions shown · non-contrast
Comparison: CT 09/20/2020.  Chest x-ray 09/20/2020.

CLINICAL DATA: Cardiac device in situ.

EXAM:
CHEST - 2 VIEW

[chest lat]
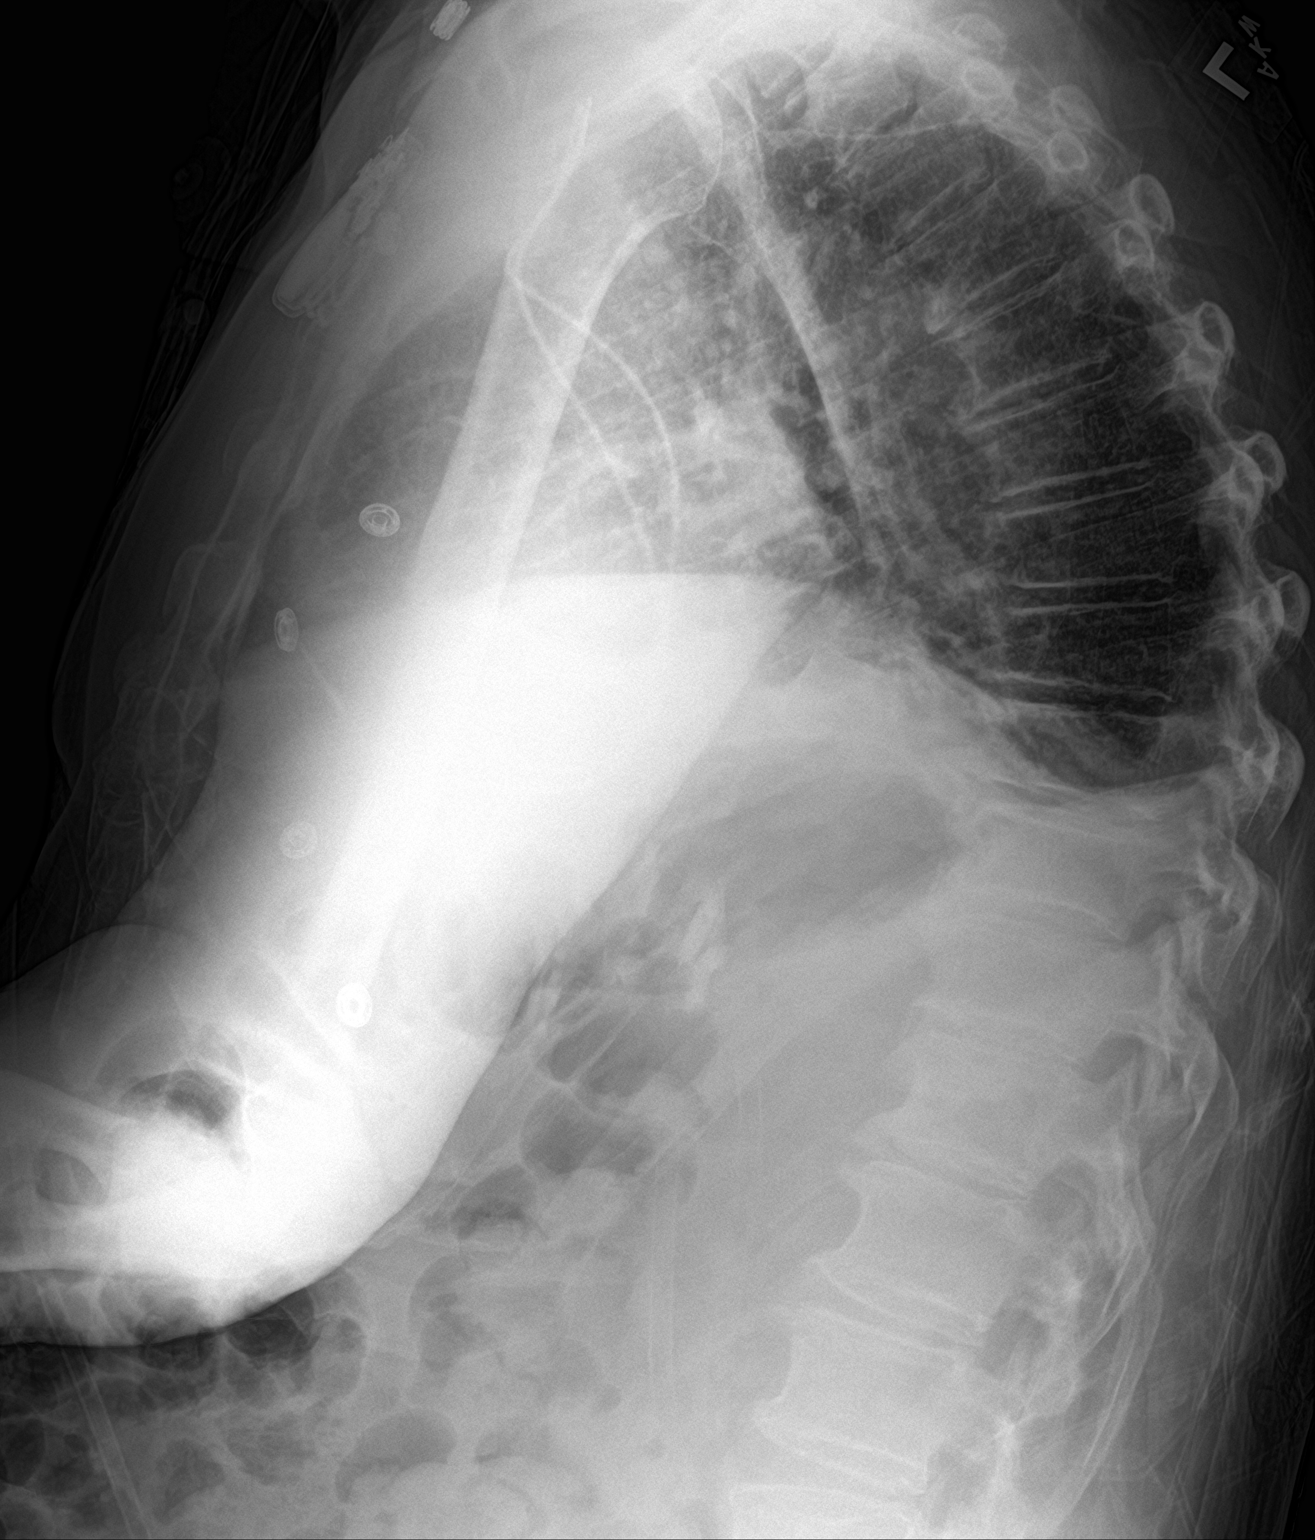

[chest ap]
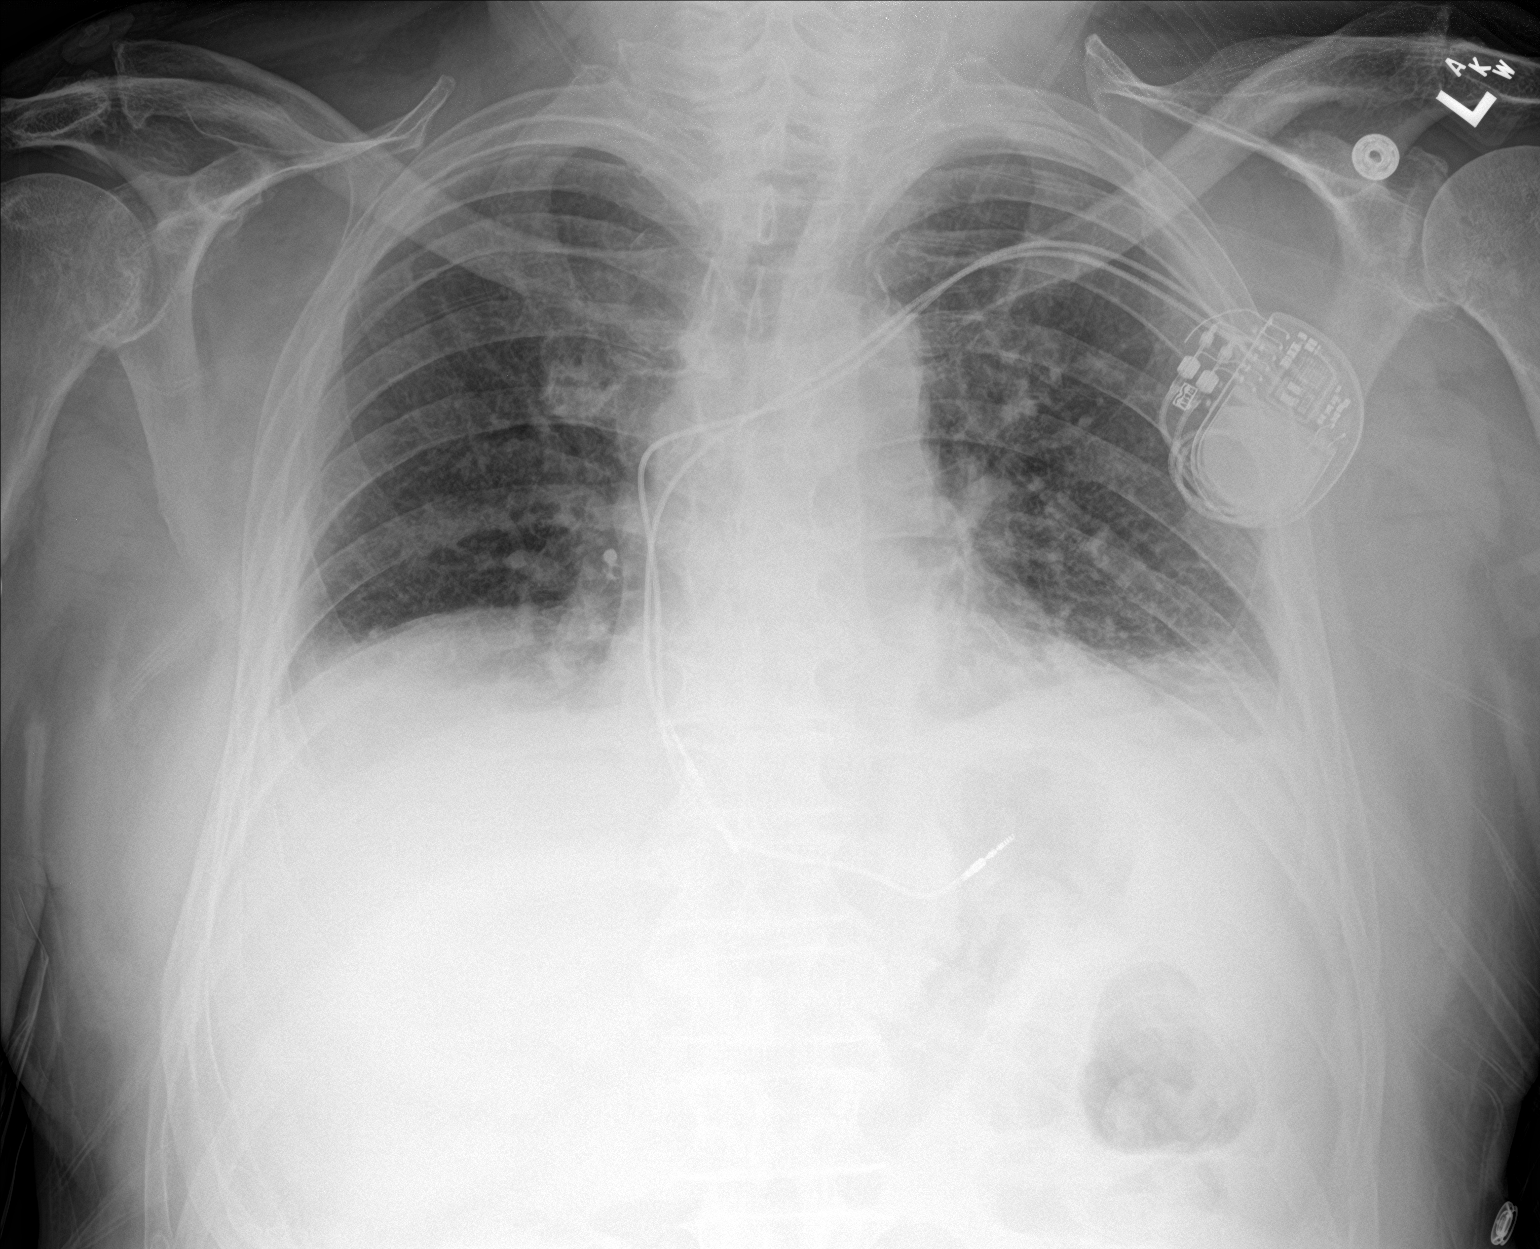

[2 of 2 positions shown; findings below may reference images not displayed]

FINDINGS: Cardiac pacer noted with lead tips over the right atrium right
ventricle. Heart size normal. No pulmonary venous congestion. Low
lung volumes with persistent bibasilar atelectasis. Tiny bilateral
pleural effusions cannot be excluded. No pneumothorax.
IMPRESSION: 1. Cardiac pacer with lead tips over the right atrium and right
ventricle. Heart size normal. No pulmonary venous congestion.
2. Low lung volumes with persistent bibasilar atelectasis. Tiny
bilateral pleural effusions cannot be excluded.
